# Patient Record
Sex: Male | Born: 1940 | Race: Black or African American | Hispanic: No | Marital: Married | Smoking: Never smoker
Health system: Southern US, Community
[De-identification: ages and names within clinical notes are randomized; demographics above are authoritative.]

## PROBLEM LIST (undated history)

## (undated) DIAGNOSIS — F419 Anxiety disorder, unspecified: Secondary | ICD-10-CM

## (undated) DIAGNOSIS — Z87898 Personal history of other specified conditions: Secondary | ICD-10-CM

## (undated) DIAGNOSIS — Z8669 Personal history of other diseases of the nervous system and sense organs: Secondary | ICD-10-CM

## (undated) DIAGNOSIS — G8929 Other chronic pain: Secondary | ICD-10-CM

## (undated) HISTORY — DX: Other chronic pain: G89.29

## (undated) HISTORY — DX: Personal history of other specified conditions: Z87.898

## (undated) HISTORY — DX: Personal history of other diseases of the nervous system and sense organs: Z86.69

## (undated) HISTORY — DX: Anxiety disorder, unspecified: F41.9

---

## 2014-11-02 ENCOUNTER — Encounter: Payer: Self-pay | Admitting: Family Medicine

## 2014-11-02 ENCOUNTER — Ambulatory Visit (INDEPENDENT_AMBULATORY_CARE_PROVIDER_SITE_OTHER): Payer: Medicare Other | Admitting: Family Medicine

## 2014-11-02 VITALS — BP 126/78 | HR 72 | Ht 65.5 in | Wt 182.4 lb

## 2014-11-02 DIAGNOSIS — G8929 Other chronic pain: Secondary | ICD-10-CM

## 2014-11-02 DIAGNOSIS — Z7189 Other specified counseling: Secondary | ICD-10-CM

## 2014-11-02 DIAGNOSIS — Z7689 Persons encountering health services in other specified circumstances: Secondary | ICD-10-CM

## 2014-11-02 DIAGNOSIS — F1110 Opioid abuse, uncomplicated: Secondary | ICD-10-CM | POA: Diagnosis not present

## 2014-11-02 NOTE — Progress Notes (Signed)
Subjective:    Patient ID: Michael Wells, male    DOB: 04-03-1940, 74 y.o.   MRN: [id number]  HPI He is new to the practice and here to establish primary care. He has been going to [facility] urgent care for his chronic pain and states they will no longer see him due to his recent change in insurance from [employer] to [employer].  Reports seing Dr. Marcello Fennel, urology and states he sees him for prostate issues, sees him annually and has appointment later this month.   States he takes hydrocodone for arthritis mainly on his left side and back, and "twisted pelvis" that was diagnosed years ago in the [employer]. Reports chronic pain for a few years and has been taking Hydrocodone for this. He states he takes up to 10 per day sometimes.  He is requesting a refill of his Hydrocodone and has empty bottle that was prescribed on 10/19/14 with #150.   States he takes Xanax for depression, anxiety and sleep. Denies any other health problems in the past. Denies surgeries or recent hospitalizations. Denies fever, chills, chest pain, shortness of breath, headache, dizziness, GI or GU symptoms.  Colonoscopy - Dr. Lupe Carney office last month, September, and normal per patient.  Eye doctor 2 years ago.  Blood work done at Urgent Care in past 6 months. Lives with wife, retired from [employer] for 30 years.  3 kids Never smoker, drinks socially 1-2 times per month, and denies drug use.   immunzations- not known States he has never had shingles or pneumonia vaccines and does not know what these are.   Reviewed allergies, medications, past medical and social history.   Review of Systems Pertinent positives and negatives in the history of present illness.    Objective:   Physical Exam  Alert and oriented and in no acute distress. Not otherwise examined      Assessment & Plan:  Chronic pain  Narcotic abuse  Spoke with Wray Kearns, NP at [facility] and she states she has been  prescribing hydrocodone for patient and she gave him preprinted prescriptions dated 11/19/2014 and 12/19/2014 and that he should have enough pain medication to get him through until January. Discussed with her that he was here requesting refills on his pain medication and that he stated he had been taking up to 10 per day some days. She stated she has never had a problem with him requesting his pain medication early and that she had performed drug testing on him and he always tested positive for the medication in his system. She states they do not take his new health insurance. She states they will fax over his records.  Discussed at length, with patient, that I would not be able to refill his narcotic pain medication and that he should not be taking them more than prescribed. Discussed risks of taking hydrocodone more than prescribed including respiratory depression and death. Recommend that he go to the pain clinic for further evaluation and management of chronic pain. Referral was made.  Also discussed that Xanax is not an appropriate medication to use for sleep and spent several minutes discussing good sleep hygiene.  He appears to have limited knowledge regarding primary care, immunizations or health maintenance. He states that he doesn't think he would need to come back here because his only problem is needing his pain medication. Discussed that he may have other medical issues in the future that he would need to have addressed. Spent more  than 45 minutes with patient and more than 50 percent in counseling.

## 2014-11-02 NOTE — Patient Instructions (Signed)
We will refer you to Pain management. You have enough pain medication to get you through til January according to Wray KearnsKimberly Milsap, NP at [facility].

## 2014-12-16 ENCOUNTER — Ambulatory Visit
Admission: RE | Admit: 2014-12-16 | Discharge: 2014-12-16 | Disposition: A | Discharge status: Home / Self Care | Payer: BLUE CROSS/BLUE SHIELD | Source: Ambulatory Visit | Attending: Anesthesiology | Admitting: Anesthesiology

## 2014-12-16 ENCOUNTER — Other Ambulatory Visit: Payer: Self-pay | Admitting: Anesthesiology

## 2014-12-16 DIAGNOSIS — M545 Low back pain, unspecified: Secondary | ICD-10-CM

## 2017-07-01 ENCOUNTER — Telehealth: Payer: Self-pay

## 2017-07-01 NOTE — Telephone Encounter (Signed)
Left message on voicemail for patient to call to schedule AWV.

## 2017-07-14 ENCOUNTER — Telehealth: Payer: Self-pay | Admitting: Family Medicine

## 2017-07-14 NOTE — Telephone Encounter (Signed)
Pt returned the call and declined to make an appt. Pt states he is no longer a pt here.

## 2020-03-09 ENCOUNTER — Emergency Department (HOSPITAL_COMMUNITY): Payer: Medicare (Managed Care)

## 2020-03-09 ENCOUNTER — Encounter (HOSPITAL_COMMUNITY): Payer: Self-pay | Admitting: Emergency Medicine

## 2020-03-09 ENCOUNTER — Inpatient Hospital Stay (HOSPITAL_COMMUNITY)
Admission: EM | Admit: 2020-03-09 | Discharge: 2020-03-11 | DRG: 035 | Disposition: A | Discharge status: Home / Self Care | Payer: Medicare (Managed Care) | Attending: Internal Medicine | Admitting: Internal Medicine

## 2020-03-09 ENCOUNTER — Other Ambulatory Visit: Payer: Self-pay

## 2020-03-09 DIAGNOSIS — I63411 Cerebral infarction due to embolism of right middle cerebral artery: Secondary | ICD-10-CM | POA: Diagnosis not present

## 2020-03-09 DIAGNOSIS — Z791 Long term (current) use of non-steroidal anti-inflammatories (NSAID): Secondary | ICD-10-CM

## 2020-03-09 DIAGNOSIS — I6340 Cerebral infarction due to embolism of unspecified cerebral artery: Secondary | ICD-10-CM | POA: Diagnosis not present

## 2020-03-09 DIAGNOSIS — H3411 Central retinal artery occlusion, right eye: Secondary | ICD-10-CM | POA: Diagnosis present

## 2020-03-09 DIAGNOSIS — G8929 Other chronic pain: Secondary | ICD-10-CM

## 2020-03-09 DIAGNOSIS — F419 Anxiety disorder, unspecified: Secondary | ICD-10-CM | POA: Diagnosis present

## 2020-03-09 DIAGNOSIS — G8921 Chronic pain due to trauma: Secondary | ICD-10-CM

## 2020-03-09 DIAGNOSIS — H5461 Unqualified visual loss, right eye, normal vision left eye: Secondary | ICD-10-CM | POA: Diagnosis present

## 2020-03-09 DIAGNOSIS — Z20822 Contact with and (suspected) exposure to covid-19: Secondary | ICD-10-CM | POA: Diagnosis present

## 2020-03-09 DIAGNOSIS — Z79899 Other long term (current) drug therapy: Secondary | ICD-10-CM

## 2020-03-09 DIAGNOSIS — I6523 Occlusion and stenosis of bilateral carotid arteries: Secondary | ICD-10-CM | POA: Diagnosis present

## 2020-03-09 DIAGNOSIS — Z810 Family history of intellectual disabilities: Secondary | ICD-10-CM

## 2020-03-09 DIAGNOSIS — I639 Cerebral infarction, unspecified: Secondary | ICD-10-CM | POA: Diagnosis not present

## 2020-03-09 DIAGNOSIS — H539 Unspecified visual disturbance: Secondary | ICD-10-CM

## 2020-03-09 DIAGNOSIS — F17210 Nicotine dependence, cigarettes, uncomplicated: Secondary | ICD-10-CM | POA: Diagnosis present

## 2020-03-09 DIAGNOSIS — Z7982 Long term (current) use of aspirin: Secondary | ICD-10-CM

## 2020-03-09 DIAGNOSIS — Z8261 Family history of arthritis: Secondary | ICD-10-CM

## 2020-03-09 DIAGNOSIS — R29701 NIHSS score 1: Secondary | ICD-10-CM | POA: Diagnosis present

## 2020-03-09 DIAGNOSIS — G479 Sleep disorder, unspecified: Secondary | ICD-10-CM | POA: Diagnosis present

## 2020-03-09 DIAGNOSIS — I672 Cerebral atherosclerosis: Secondary | ICD-10-CM | POA: Diagnosis present

## 2020-03-09 LAB — BASIC METABOLIC PANEL
Anion gap: 11 (ref 5–15)
BUN: 11 mg/dL (ref 8–23)
CO2: 20 mmol/L — ABNORMAL LOW (ref 22–32)
Calcium: 10.7 mg/dL — ABNORMAL HIGH (ref 8.9–10.3)
Chloride: 107 mmol/L (ref 98–111)
Creatinine, Ser: 1.03 mg/dL (ref 0.61–1.24)
GFR, Estimated: >60 mL/min (ref >60)
Glucose, Bld: 95 mg/dL (ref 70–99)
Potassium: 3.9 mmol/L (ref 3.5–5.1)
Sodium: 138 mmol/L (ref 135–145)

## 2020-03-09 LAB — SEDIMENTATION RATE: Sed Rate: 15 mm/hr (ref 0–16)

## 2020-03-09 LAB — CBC
HCT: 43.9 % (ref 39.0–52.0)
Hemoglobin: 15 g/dL (ref 13.0–17.0)
MCH: 33.6 pg (ref 26.0–34.0)
MCHC: 34.2 g/dL (ref 30.0–36.0)
MCV: 98.4 fL (ref 80.0–100.0)
Platelets: 171 10*3/uL (ref 150–400)
RBC: 4.46 MIL/uL (ref 4.22–5.81)
RDW: 13.5 % (ref 11.5–15.5)
WBC: 7.1 10*3/uL (ref 4.0–10.5)
nRBC: 0 % (ref 0.0–0.2)

## 2020-03-09 LAB — C-REACTIVE PROTEIN: CRP: 0.5 mg/dL (ref <1.0)

## 2020-03-09 MED ORDER — ASPIRIN EC 81 MG PO TBEC: 81.0000 mg | DELAYED_RELEASE_TABLET | Freq: Every day | ORAL | Status: DC

## 2020-03-09 MED ORDER — ENOXAPARIN SODIUM 40 MG/0.4ML ~~LOC~~ SOLN
40.0000 mg | SUBCUTANEOUS | Status: DC
  Administered 2020-03-09 – 2020-03-10 (×2): 40 mg via SUBCUTANEOUS
  Filled 2020-03-09 (×2): qty 0.4

## 2020-03-09 MED ORDER — GADOBUTROL 1 MMOL/ML IV SOLN
8.0000 mL | Freq: Once | INTRAVENOUS | Status: AC | PRN
  Administered 2020-03-09: 8 mL via INTRAVENOUS

## 2020-03-09 MED ORDER — ATORVASTATIN CALCIUM 40 MG PO TABS
40.0000 mg | ORAL_TABLET | Freq: Every day | ORAL | Status: DC
  Administered 2020-03-10 – 2020-03-11 (×2): 40 mg via ORAL
  Filled 2020-03-09 (×2): qty 1

## 2020-03-09 MED ORDER — ASPIRIN 81 MG PO CHEW
324.0000 mg | CHEWABLE_TABLET | Freq: Once | ORAL | Status: AC
  Administered 2020-03-09: 324 mg via ORAL
  Filled 2020-03-09: qty 4

## 2020-03-09 MED ORDER — STROKE: EARLY STAGES OF RECOVERY BOOK
Freq: Once | Status: AC
  Administered 2020-03-09: 1
  Filled 2020-03-09: qty 1

## 2020-03-09 MED ORDER — ACETAMINOPHEN 160 MG/5ML PO SOLN: 650.0000 mg | ORAL | Status: DC | PRN

## 2020-03-09 MED ORDER — ACETAMINOPHEN 650 MG RE SUPP: 650.0000 mg | RECTAL | Status: DC | PRN

## 2020-03-09 MED ORDER — IOHEXOL 350 MG/ML SOLN
75.0000 mL | Freq: Once | INTRAVENOUS | Status: AC | PRN
  Administered 2020-03-09: 75 mL via INTRAVENOUS

## 2020-03-09 MED ORDER — HYDROCODONE-ACETAMINOPHEN 5-325 MG PO TABS
1.0000 | ORAL_TABLET | Freq: Four times a day (QID) | ORAL | Status: DC | PRN
  Administered 2020-03-11: 1 via ORAL
  Filled 2020-03-09: qty 1

## 2020-03-09 MED ORDER — ACETAMINOPHEN 325 MG PO TABS: 650.0000 mg | ORAL_TABLET | ORAL | Status: DC | PRN

## 2020-03-09 NOTE — ED Notes (Signed)
Back to room at this time.
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
## 2020-03-09 NOTE — ED Triage Notes (Signed)
Pt sent by his eye doctor for further evaluation of potential right optic nerve edema. Pt reports his vision is cloudy in eye.

## 2020-03-09 NOTE — ED Notes (Signed)
Rounded in room and have not seen this pt in room.

## 2020-03-09 NOTE — ED Notes (Signed)
Called MRI and staff states that pt is there, waiting on IV team so pt can have scans done.

## 2020-03-09 NOTE — ED Provider Notes (Signed)
I spoke with his ophthalmologist, Dr. Talbert Forest.   Patient has had decreased vision in right eye, noted some papilledema.  Recommending ESR, CRP to rule out giant cell arteritis, recommending MRI brain and orbits to rule out mass, stroke.  If the work-up is negative, likely patient can be discharged home and he will follow up patient in the outpatient setting.   Lucrezia Starch, MD 03/09/20 1556

## 2020-03-09 NOTE — ED Notes (Signed)
Patient transported to CT 

## 2020-03-09 NOTE — ED Provider Notes (Signed)
[facility] EMERGENCY DEPARTMENT Provider Note   CSN: [id number] Arrival date & time: 03/09/20  1523     History Chief Complaint  Patient presents with  . Eye Problem    Michael Wells is a 80 y.o. male.  The history is provided by the patient, the spouse and medical records. No language interpreter was used.     80 year old male significant history of anxiety, chronic pain, sent here at the recommendations of ophthalmologist Dr. Vonna Kotyk for evaluations of vision changes.  Patient report for the past 2 weeks he has noticed change in vision in his right eye.  He described as a dusty sand-like sensation to his eye or with blurry vision sometimes he has to close the eye in order to see from his left eye.  He has been waxing and waning but does affect his vision.  No associated fever chills no headache no double vision no focal numbness or focal weakness or neck pain.  He was seen by his eye specialist today but was recommended to come to ER for MRI of his brain when he was found to have potential right optic nerve edema on the exam.  Patient without history of prior stroke.  He has been fully vaccinated for COVID-19.  He denies having any confusion.  Past Medical History:  Diagnosis Date  . Anxiety   . Chronic pain   . H/O sleep disturbance     There are no problems to display for this patient.   History reviewed. No pertinent surgical history.     Family History  Problem Relation Age of Onset  . Cancer Mother        cervical  . Cancer Father        prostate  . Arthritis Sister   . Drug abuse Brother   . Mental retardation Brother   . Aneurysm Brother     Social History   Tobacco Use  . Smoking status: Never Smoker  Substance Use Topics  . Alcohol use: Yes    Alcohol/week: 0.0 standard drinks    Comment: socially 1-2 drinks month  . Drug use: No    Home Medications Prior to Admission medications   Medication Sig Start Date End Date Taking?  Authorizing Provider  ALPRAZolam Prudy Feeler) 0.25 MG tablet Take 0.25 mg by mouth at bedtime as needed for anxiety or sleep.    [provider]  HYDROcodone-acetaminophen (NORCO/VICODIN) 5-325 MG tablet Take 1 tablet by mouth every 6 (six) hours as needed for moderate pain.    [provider]  meloxicam (MOBIC) 15 MG tablet Take 15 mg by mouth daily.    [provider]    Allergies    Patient has no known allergies.  Review of Systems   Review of Systems  All other systems reviewed and are negative.   Physical Exam Updated Vital Signs BP (!) 149/101 (BP Location: Left Arm)   Pulse 81   Temp (!) 97.5 F (36.4 C) (Oral)   Resp 18   Ht 5\' 7"  (1.702 m)   Wt 77.1 kg   SpO2 96%   BMI 26.63 kg/m   Physical Exam Vitals and nursing note reviewed.  Constitutional:      General: He is not in acute distress.    Appearance: He is well-developed and well-nourished.  HENT:     Head: Normocephalic and atraumatic.  Eyes:     General: Lids are normal. Lids are everted, no foreign bodies appreciated.  Right eye: No discharge.        Left eye: No discharge.     Conjunctiva/sclera: Conjunctivae normal.     Right eye: Right conjunctiva is not injected. No chemosis, exudate or hemorrhage.    Left eye: Left conjunctiva is not injected. No chemosis, exudate or hemorrhage.    Comments: Right eye with diminished visual acuity compared to left.  Musculoskeletal:     Cervical back: Neck supple.  Skin:    Findings: No rash.  Neurological:     Mental Status: He is alert and oriented to person, place, and time.     GCS: GCS eye subscore is 4. GCS verbal subscore is 5. GCS motor subscore is 6.     Cranial Nerves: Cranial nerves are intact.     Sensory: Sensation is intact.     Motor: Motor function is intact.  Psychiatric:        Mood and Affect: Mood and affect normal.     ED Results / Procedures / Treatments   Labs (all labs ordered are listed, but only  abnormal results are displayed) Labs Reviewed  BASIC METABOLIC PANEL - Abnormal; Notable for the following components:      Result Value   CO2 20 (*)    Calcium 10.7 (*)    All other components within normal limits  SARS CORONAVIRUS 2 (TAT 6-24 HRS)  CBC  C-REACTIVE PROTEIN  SEDIMENTATION RATE  HEMOGLOBIN A1C  LIPID PANEL    EKG None  Radiology MR ORBITS W WO CONTRAST  Result Date: 03/09/2020 CLINICAL DATA:  Monocular right sided vision loss.  Papilledema. EXAM: MRI HEAD AND ORBITS WITHOUT AND WITH CONTRAST TECHNIQUE: Multiplanar, multiecho pulse sequences of the brain and surrounding structures were obtained without and with intravenous contrast. Multiplanar, multiecho pulse sequences of the orbits and surrounding structures were obtained including fat saturation techniques, before and after intravenous contrast administration. CONTRAST:  43mL GADAVIST GADOBUTROL 1 MMOL/ML IV SOLN COMPARISON:  None. FINDINGS: MRI HEAD FINDINGS Brain: The study suffers from motion degradation despite the technologist's best efforts. Diffusion imaging shows punctate acute infarction in the right parieto-occipital subcortical white matter. Few other punctate foci of acute infarction at the right frontoparietal junction vertex. No large confluent infarction. Findings are consistent with micro embolic insults. Elsewhere, there chronic small-vessel ischemic changes of the pons. Few old small vessel cerebellar infarctions. Cerebral hemispheres show pronounced chronic small-vessel ischemic changes throughout the deep and subcortical white matter. No mass, hemorrhage, hydrocephalus or extra-axial collection. After contrast administration, no abnormal brain enhancement occurs. Vascular: Major vessels at the base of the brain show flow. Skull and upper cervical spine: Negative Other: None MRI ORBITS FINDINGS This study also suffers from motion degradation. Orbits: Both globes appear normal. Both optic nerves appear normal.  Orbital fat is normal. Extra-ocular muscles are normal. Visualized sinuses: Clear except for minimal left anterior ethmoid fluid. Soft tissues: Other regional soft tissues appear normal. IMPRESSION: 1. Motion degraded exam. 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 Few other punctate foci of acute infarction at the right frontoparietal junction vertex. Findings consistent with micro embolic insults from the right carotid, heart or ascending aorta. No swelling or hemorrhage. Certainly there could be micro embolic disease to the right orbit not visible by MRI. 2. 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 3. The orbital study itself is normal. Electronically Signed   By: Paulina Fusi M.D.   On: 03/09/2020 20:58  Procedures .Critical Care Performed by: Fayrene Helper, PA-C Authorized by: Fayrene Helper, PA-C   Critical care provider statement:    Critical care time (minutes):  37   Critical care was time spent personally by me on the following activities:  Discussions with consultants, evaluation of patient's response to treatment, examination of patient, ordering and performing treatments and interventions, ordering and review of laboratory studies, ordering and review of radiographic studies, pulse oximetry, re-evaluation of patient's condition, obtaining history from patient or surrogate and review of old charts     Medications Ordered in ED Medications  aspirin chewable tablet 324 mg (has no administration in time range)  gadobutrol (GADAVIST) 1 MMOL/ML injection 8 mL (8 mLs Intravenous Contrast Given 03/09/20 2003)    ED Course  I have reviewed the triage vital signs and the nursing notes.  Pertinent labs & imaging results that were available during my care of the patient were reviewed by me and considered in my medical decision making (see chart for details).    MDM Rules/Calculators/A&P                           BP (!) 157/95   Pulse 62   Temp (!) 97.5 F (36.4 C) (Oral)   Resp 14   Ht 5\' 7"  (1.702 m)   Wt 77.1 kg   SpO2 97%   BMI 26.63 kg/m   Final Clinical Impression(s) / ED Diagnoses Final diagnoses:  Acute embolic stroke (HCC)  Visual changes    Rx / DC Orders ED Discharge Orders    None     9:14 PM Patient noticed visual changes to involving his right eye that is waxing waning ongoing for the past 2 weeks.  Was seen by his ophthalmologist today and was sent here for further work-up when he was found to have signs of an optic nerve edema on initial exam by ophthalmologist.  MRI of the brain as well as the orbit was obtained which shows punctate acute infarction in the right parieto-occipital subcortical white matter as well as few other punctate foci of subacute infarction in the right frontal parietal junction vertex.  Findings discussed with microembolic insults from the right carotid heart or ascending aorta.  No swelling or hemorrhage.  Extensive chronic small vessel ischemic changes elsewhere throughout the brain.  I did discuss this finding with patient as well has with on-call neurologist, Dr. who recommend initiate aspirin, obtain head and neck CT angiogram and consult medicine for admission.  At this time patient is hemodynamically stable.  9:42 PM Appreciate consultation from [facility] Dr. Iver Nestle who agrees to see and will admit pt for further care.    Cyndia Bent, PA-C 03/09/20 2142    [place]orton, 2143, DO 03/10/20 0011

## 2020-03-09 NOTE — ED Notes (Signed)
Pt still not in room.

## 2020-03-10 ENCOUNTER — Observation Stay (HOSPITAL_COMMUNITY): Payer: Medicare (Managed Care)

## 2020-03-10 ENCOUNTER — Encounter (HOSPITAL_COMMUNITY)
Admission: EM | Disposition: A | Discharge status: Home / Self Care | Payer: Self-pay | Source: Home / Self Care | Attending: Internal Medicine

## 2020-03-10 ENCOUNTER — Observation Stay (HOSPITAL_COMMUNITY): Payer: Medicare (Managed Care) | Admitting: Certified Registered Nurse Anesthetist

## 2020-03-10 ENCOUNTER — Encounter (HOSPITAL_COMMUNITY): Payer: Self-pay | Admitting: Family Medicine

## 2020-03-10 ENCOUNTER — Observation Stay (HOSPITAL_BASED_OUTPATIENT_CLINIC_OR_DEPARTMENT_OTHER): Payer: Medicare (Managed Care)

## 2020-03-10 DIAGNOSIS — Z7982 Long term (current) use of aspirin: Secondary | ICD-10-CM | POA: Diagnosis not present

## 2020-03-10 DIAGNOSIS — E7800 Pure hypercholesterolemia, unspecified: Secondary | ICD-10-CM

## 2020-03-10 DIAGNOSIS — F419 Anxiety disorder, unspecified: Secondary | ICD-10-CM | POA: Diagnosis not present

## 2020-03-10 DIAGNOSIS — F17210 Nicotine dependence, cigarettes, uncomplicated: Secondary | ICD-10-CM | POA: Diagnosis not present

## 2020-03-10 DIAGNOSIS — H3411 Central retinal artery occlusion, right eye: Secondary | ICD-10-CM

## 2020-03-10 DIAGNOSIS — H539 Unspecified visual disturbance: Secondary | ICD-10-CM | POA: Diagnosis not present

## 2020-03-10 DIAGNOSIS — G8921 Chronic pain due to trauma: Secondary | ICD-10-CM | POA: Diagnosis present

## 2020-03-10 DIAGNOSIS — I63411 Cerebral infarction due to embolism of right middle cerebral artery: Secondary | ICD-10-CM | POA: Diagnosis present

## 2020-03-10 DIAGNOSIS — G8929 Other chronic pain: Secondary | ICD-10-CM | POA: Diagnosis not present

## 2020-03-10 DIAGNOSIS — R29701 NIHSS score 1: Secondary | ICD-10-CM | POA: Diagnosis not present

## 2020-03-10 DIAGNOSIS — Z95828 Presence of other vascular implants and grafts: Secondary | ICD-10-CM | POA: Diagnosis not present

## 2020-03-10 DIAGNOSIS — Z810 Family history of intellectual disabilities: Secondary | ICD-10-CM | POA: Diagnosis not present

## 2020-03-10 DIAGNOSIS — I6523 Occlusion and stenosis of bilateral carotid arteries: Secondary | ICD-10-CM

## 2020-03-10 DIAGNOSIS — I672 Cerebral atherosclerosis: Secondary | ICD-10-CM | POA: Diagnosis present

## 2020-03-10 DIAGNOSIS — H5461 Unqualified visual loss, right eye, normal vision left eye: Secondary | ICD-10-CM | POA: Diagnosis present

## 2020-03-10 DIAGNOSIS — Z20822 Contact with and (suspected) exposure to covid-19: Secondary | ICD-10-CM | POA: Diagnosis not present

## 2020-03-10 DIAGNOSIS — I6521 Occlusion and stenosis of right carotid artery: Secondary | ICD-10-CM | POA: Diagnosis not present

## 2020-03-10 DIAGNOSIS — G479 Sleep disorder, unspecified: Secondary | ICD-10-CM | POA: Diagnosis present

## 2020-03-10 DIAGNOSIS — Z8261 Family history of arthritis: Secondary | ICD-10-CM | POA: Diagnosis not present

## 2020-03-10 DIAGNOSIS — I639 Cerebral infarction, unspecified: Secondary | ICD-10-CM | POA: Diagnosis present

## 2020-03-10 DIAGNOSIS — I6389 Other cerebral infarction: Secondary | ICD-10-CM

## 2020-03-10 DIAGNOSIS — Z791 Long term (current) use of non-steroidal anti-inflammatories (NSAID): Secondary | ICD-10-CM | POA: Diagnosis not present

## 2020-03-10 DIAGNOSIS — Z79899 Other long term (current) drug therapy: Secondary | ICD-10-CM | POA: Diagnosis not present

## 2020-03-10 HISTORY — PX: IR STENT PLACEMENT ANTE CAROTID INC ANGIO: IMG5522

## 2020-03-10 HISTORY — PX: RADIOLOGY WITH ANESTHESIA: SHX6223

## 2020-03-10 HISTORY — PX: IR INTRAVSC STENT CERV CAROTID W/EMB-PROT MOD SED INCL ANGIO: IMG2303

## 2020-03-10 HISTORY — PX: IR ANGIO VERTEBRAL SEL SUBCLAVIAN INNOMINATE UNI L MOD SED: IMG5364

## 2020-03-10 HISTORY — PX: IR US GUIDE VASC ACCESS RIGHT: IMG2390

## 2020-03-10 LAB — HEMOGLOBIN A1C
Hgb A1c MFr Bld: 5.6 % (ref 4.8–5.6)
Mean Plasma Glucose: 114.02 mg/dL

## 2020-03-10 LAB — CBC WITH DIFFERENTIAL/PLATELET
Abs Immature Granulocytes: 0.01 10*3/uL (ref 0.00–0.07)
Basophils Absolute: 0 10*3/uL (ref 0.0–0.1)
Basophils Relative: 0 %
Eosinophils Absolute: 0.1 10*3/uL (ref 0.0–0.5)
Eosinophils Relative: 1 %
HCT: 37.3 % — ABNORMAL LOW (ref 39.0–52.0)
Hemoglobin: 12.7 g/dL — ABNORMAL LOW (ref 13.0–17.0)
Immature Granulocytes: 0 %
Lymphocytes Relative: 15 %
Lymphs Abs: 1.3 10*3/uL (ref 0.7–4.0)
MCH: 33.1 pg (ref 26.0–34.0)
MCHC: 34 g/dL (ref 30.0–36.0)
MCV: 97.1 fL (ref 80.0–100.0)
Monocytes Absolute: 0.6 10*3/uL (ref 0.1–1.0)
Monocytes Relative: 7 %
Neutro Abs: 6.4 10*3/uL (ref 1.7–7.7)
Neutrophils Relative %: 77 %
Platelets: 157 10*3/uL (ref 150–400)
RBC: 3.84 MIL/uL — ABNORMAL LOW (ref 4.22–5.81)
RDW: 13.3 % (ref 11.5–15.5)
WBC: 8.4 10*3/uL (ref 4.0–10.5)
nRBC: 0 % (ref 0.0–0.2)

## 2020-03-10 LAB — ECHOCARDIOGRAM COMPLETE
AR max vel: 2.34 cm2
AV Area VTI: 2.38 cm2
AV Area mean vel: 2.26 cm2
AV Mean grad: 4 mmHg
AV Peak grad: 6.9 mmHg
Ao pk vel: 1.31 m/s
Height: 67 in
S' Lateral: 3 cm
Weight: 2720 [oz_av]

## 2020-03-10 LAB — PROTIME-INR
INR: 1 (ref 0.8–1.2)
Prothrombin Time: 13.2 seconds (ref 11.4–15.2)

## 2020-03-10 LAB — LIPID PANEL
Cholesterol: 145 mg/dL (ref 0–200)
HDL: 39 mg/dL — ABNORMAL LOW (ref >40)
LDL Cholesterol: 95 mg/dL (ref 0–99)
Total CHOL/HDL Ratio: 3.7 ratio
Triglycerides: 53 mg/dL (ref <150)
VLDL: 11 mg/dL (ref 0–40)

## 2020-03-10 LAB — GLUCOSE, CAPILLARY
Glucose-Capillary: 66 mg/dL — ABNORMAL LOW (ref 70–99)
Glucose-Capillary: 86 mg/dL (ref 70–99)
Glucose-Capillary: 87 mg/dL (ref 70–99)

## 2020-03-10 LAB — POCT ACTIVATED CLOTTING TIME
Activated Clotting Time: 255 seconds
Activated Clotting Time: 333 seconds

## 2020-03-10 LAB — TYPE AND SCREEN
ABO/RH(D): O POS
Antibody Screen: NEGATIVE

## 2020-03-10 LAB — ABO/RH: ABO/RH(D): O POS

## 2020-03-10 LAB — SARS CORONAVIRUS 2 (TAT 6-24 HRS): SARS Coronavirus 2: NEGATIVE

## 2020-03-10 SURGERY — IR WITH ANESTHESIA
Anesthesia: Monitor Anesthesia Care

## 2020-03-10 MED ORDER — CLEVIDIPINE BUTYRATE 0.5 MG/ML IV EMUL
INTRAVENOUS | Status: AC
  Filled 2020-03-10: qty 50

## 2020-03-10 MED ORDER — LACTATED RINGERS IV SOLN: INTRAVENOUS | Status: DC | PRN

## 2020-03-10 MED ORDER — ASPIRIN EC 81 MG PO TBEC
81.0000 mg | DELAYED_RELEASE_TABLET | Freq: Every day | ORAL | Status: DC
  Administered 2020-03-10 – 2020-03-11 (×2): 81 mg via ORAL
  Filled 2020-03-10 (×2): qty 1

## 2020-03-10 MED ORDER — FENTANYL CITRATE (PF) 100 MCG/2ML IJ SOLN
INTRAMUSCULAR | Status: AC
  Filled 2020-03-10: qty 2

## 2020-03-10 MED ORDER — MORPHINE SULFATE (PF) 2 MG/ML IV SOLN: 2.0000 mg | Freq: Once | INTRAVENOUS | Status: DC

## 2020-03-10 MED ORDER — PROPOFOL 500 MG/50ML IV EMUL
INTRAVENOUS | Status: DC | PRN
  Administered 2020-03-10: 40 ug/kg/min via INTRAVENOUS

## 2020-03-10 MED ORDER — IOHEXOL 300 MG/ML  SOLN
150.0000 mL | Freq: Once | INTRAMUSCULAR | Status: AC | PRN
  Administered 2020-03-10: 60 mL via INTRA_ARTERIAL

## 2020-03-10 MED ORDER — ASPIRIN EC 325 MG PO TBEC
325.0000 mg | DELAYED_RELEASE_TABLET | Freq: Every day | ORAL | Status: DC
Start: 2020-03-10 — End: 2020-03-10

## 2020-03-10 MED ORDER — DEXMEDETOMIDINE HCL IN NACL 400 MCG/100ML IV SOLN: 0.4000 ug/kg/h | INTRAVENOUS | Status: DC

## 2020-03-10 MED ORDER — ONDANSETRON HCL 4 MG/2ML IJ SOLN
4.0000 mg | Freq: Four times a day (QID) | INTRAMUSCULAR | Status: DC | PRN
  Administered 2020-03-11: 4 mg via INTRAVENOUS
  Filled 2020-03-10 (×2): qty 2

## 2020-03-10 MED ORDER — FENTANYL CITRATE (PF) 100 MCG/2ML IJ SOLN
INTRAMUSCULAR | Status: DC | PRN
  Administered 2020-03-10: 25 ug via INTRAVENOUS

## 2020-03-10 MED ORDER — CLOPIDOGREL BISULFATE 75 MG PO TABS: 75.0000 mg | ORAL_TABLET | Freq: Every day | ORAL | Status: DC

## 2020-03-10 MED ORDER — TICAGRELOR 90 MG PO TABS
180.0000 mg | ORAL_TABLET | Freq: Once | ORAL | Status: AC
  Administered 2020-03-10: 180 mg via ORAL
  Filled 2020-03-10: qty 2

## 2020-03-10 MED ORDER — FENTANYL CITRATE (PF) 100 MCG/2ML IJ SOLN
25.0000 ug | INTRAMUSCULAR | Status: DC | PRN
  Administered 2020-03-10: 25 ug via INTRAVENOUS

## 2020-03-10 MED ORDER — CHLORHEXIDINE GLUCONATE CLOTH 2 % EX PADS: 6.0000 | MEDICATED_PAD | Freq: Every day | CUTANEOUS | Status: DC

## 2020-03-10 MED ORDER — CLEVIDIPINE BUTYRATE 0.5 MG/ML IV EMUL: 0.0000 mg/h | INTRAVENOUS | Status: DC

## 2020-03-10 MED ORDER — TICAGRELOR 90 MG PO TABS
90.0000 mg | ORAL_TABLET | Freq: Two times a day (BID) | ORAL | Status: DC
  Administered 2020-03-10 – 2020-03-11 (×2): 90 mg via ORAL
  Filled 2020-03-10 (×3): qty 1

## 2020-03-10 MED ORDER — CLOPIDOGREL BISULFATE 300 MG PO TABS: 300.0000 mg | ORAL_TABLET | Freq: Once | ORAL | Status: DC

## 2020-03-10 MED ORDER — LIDOCAINE HCL 1 % IJ SOLN
INTRAMUSCULAR | Status: AC | PRN
  Administered 2020-03-10: 10 mL

## 2020-03-10 MED ORDER — HEPARIN SODIUM (PORCINE) 1000 UNIT/ML IJ SOLN
INTRAMUSCULAR | Status: DC | PRN
  Administered 2020-03-10: 5000 [IU] via INTRAVENOUS
  Administered 2020-03-10: 1000 [IU] via INTRAVENOUS

## 2020-03-10 MED ORDER — SODIUM CHLORIDE 0.9% IV SOLUTION: Freq: Once | INTRAVENOUS | Status: DC

## 2020-03-10 MED ORDER — DEXTROSE-NACL 5-0.9 % IV SOLN: INTRAVENOUS | Status: DC

## 2020-03-10 MED ORDER — PROPOFOL 10 MG/ML IV BOLUS
INTRAVENOUS | Status: DC | PRN
  Administered 2020-03-10: 10 mg via INTRAVENOUS

## 2020-03-10 MED ORDER — LIDOCAINE HCL 1 % IJ SOLN
INTRAMUSCULAR | Status: AC
  Filled 2020-03-10: qty 20

## 2020-03-10 NOTE — Progress Notes (Addendum)
Since admission from [facility] at 2100,  R femoral site has had grade 1 hematoma with neurovascular assessment intact. However, hematoma has been increasingly getting worse d/t pt's impulsiveness with wanting to get out of bed. This has occurred twice and the hematoma had worsened each time. Manual pressure has been performed and hematoma outlined. Neurovascular checks intact throughout episodes. Dr. Rachael Darby, [facility] was notified. Per  MD to continue to monitor. During pt's second attempt at 2330 to get out of bed, BP decreased to 80/50s. Pt given LR bolus and BP responsive. Dr. Iver Nestle, neurologist, called and per MD to draw cbc STAT. Dr.Bhagat at bedside to assess pt at 2350. Per MD to restart 6 hour flat time at 0000, continue monitoring site since hgb 12.7, notify if SBP <100 or signs of active bleeding and she will notify Dr.  Melchor Amour of pt's status.

## 2020-03-10 NOTE — Anesthesia Procedure Notes (Signed)
Procedure Name: MAC Date/Time: 03/10/2020 4:23 PM Performed by: Janene Harvey, CRNA Pre-anesthesia Checklist: Patient identified, Emergency Drugs available, Suction available and Patient being monitored Patient Re-evaluated:Patient Re-evaluated prior to induction Oxygen Delivery Method: Simple face mask Induction Type: IV induction Placement Confirmation: positive ETCO2 Dental Injury: Teeth and Oropharynx as per pre-operative assessment

## 2020-03-10 NOTE — Progress Notes (Incomplete)
Since admission from [facility], pt R femoral site has had grade 1 hematoma with neurovascular assessment intact. However, hematoma has been increasingly getting worse d/t pt's impulsiveness with wanting to get out of bed.
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
## 2020-03-10 NOTE — Progress Notes (Signed)
PROGRESS NOTE    [name]  BBC:[id number] DOB: 08/25/1940 DOA: 03/09/2020 PCP: Marva Panda, NP   Brief Narrative:  Michael Wells is a 80 y.o. 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 For the past several weeks patient has been having waxing and waning right eye blurred vision with "aches".  He was evaluated by the ophthalmologist today with concerns for potential right optic nerve edema and was told to present to the ED. In ED, MRI orbit and brain was obtained showing punctate acute infarct in the right parieto-occipital white matter.  Findings consistent with microembolic insults from either the right carotid, heart or ascending aorta.  Neurology consulted and hospitalist called for admission.  Assessment & Plan:   Active Problems:   Embolic stroke (HCC)   Chronic pain  Acute embolic infarct in the right parieto-occipital with associated right sided vision loss - MRI brain obtained showing punctate acute infarct in the right parieto-occipital white matter. 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 - start daily aspirin and atorvastatin -Neurology recommending Brilinta for dual antiplatelet therapy -Echo pending -A1c 5.6  Chronic pain  Continue home hydrocodone  DVT prophylaxis: Lovenox Code Status: Full Family Communication: Wife at bedside  Status is: Inpatient  Dispo: The patient is from: Home              Anticipated d/c is to: To be determined              Anticipated d/c date is: 24 to 48 hours              Patient currently not medically stable for discharge  Consultants:   Neurology, neurovascular interventional radiology  Procedures:   Planned stent to the right ICA  Antimicrobials:  None  Subjective: No acute issues or events overnight, patient states his vision  is slightly improved but not yet back to baseline otherwise denies nausea vomiting diarrhea constipation headache fevers or chills.  Objective: Vitals:   03/10/20 0127 03/10/20 0520 03/10/20 0630 03/10/20 0736  BP: 123/85 132/87 130/78 (!) 139/94  Pulse: 72 84 76 85  Resp: 20 16 18 15   Temp:   98 F (36.7 C)   TempSrc:   Oral   SpO2: 98% 98% 97% 95%  Weight:      Height:       No intake or output data in the 24 hours ending 03/10/20 0818 Filed Weights   03/09/20 1539  Weight: 77.1 kg    Examination:  General:  Pleasantly resting in bed, No acute distress. HEENT:  Normocephalic atraumatic.  Sclerae nonicteric, noninjected.  Extraocular movements intact bilaterally.  Profoundly diminished visual fields diffusely in the right eye.  Described as "cloudy/spotty" by the patient Neck:  Without mass or deformity.  Trachea is midline. Lungs:  Clear to auscultate bilaterally without rhonchi, wheeze, or rales. Heart:  Regular rate and rhythm.  Without murmurs, rubs, or gallops. Abdomen:  Soft, nontender, nondistended.  Without guarding or rebound. Extremities: Without cyanosis, clubbing, edema, or obvious deformity. Vascular:  Dorsalis pedis and posterior tibial pulses palpable bilaterally. Skin:  Warm and dry, no erythema, no ulcerations.  Data Reviewed: I have personally reviewed following labs and imaging studies  CBC: Recent Labs  Lab 03/09/20 1600  WBC 7.1  HGB 15.0  HCT 43.9  MCV 98.4  PLT 171   Basic Metabolic Panel:  Recent Labs  Lab 03/09/20 1600  NA 138  K 3.9  CL 107  CO2 20*  GLUCOSE 95  BUN 11  CREATININE 1.03  CALCIUM 10.7*   GFR: Estimated Creatinine Clearance: 55.3 mL/min (by C-G formula based on SCr of 1.03 mg/dL). Liver Function Tests: No results for input(s): AST, ALT, ALKPHOS, BILITOT, PROT, ALBUMIN in the last 168 hours. No results for input(s): LIPASE, AMYLASE in the last 168 hours. No results for input(s): AMMONIA in the last 168  hours. Coagulation Profile: No results for input(s): INR, PROTIME in the last 168 hours. Cardiac Enzymes: No results for input(s): CKTOTAL, CKMB, CKMBINDEX, TROPONINI in the last 168 hours. BNP (last 3 results) No results for input(s): PROBNP in the last 8760 hours. HbA1C: Recent Labs    03/10/20 0245  HGBA1C 5.6   CBG: No results for input(s): GLUCAP in the last 168 hours. Lipid Profile: Recent Labs    03/10/20 0246  CHOL 145  HDL 39*  LDLCALC 95  TRIG 53  CHOLHDL 3.7   Thyroid Function Tests: No results for input(s): TSH, T4TOTAL, FREET4, T3FREE, THYROIDAB in the last 72 hours. Anemia Panel: No results for input(s): VITAMINB12, FOLATE, FERRITIN, TIBC, IRON, RETICCTPCT in the last 72 hours. Sepsis Labs: No results for input(s): PROCALCITON, LATICACIDVEN in the last 168 hours.  Recent Results (from the past 240 hour(s))  SARS CORONAVIRUS 2 (TAT 6-24 HRS) Nasopharyngeal Nasopharyngeal Swab     Status: None   Collection Time: 03/09/20  9:15 PM   Specimen: Nasopharyngeal Swab  Result Value Ref Range Status   SARS Coronavirus 2 NEGATIVE NEGATIVE Final    Comment: (NOTE) SARS-CoV-2 target nucleic acids are NOT DETECTED.  The SARS-CoV-2 RNA is generally detectable in upper and lower respiratory specimens during the acute phase of infection. Negative results do not preclude SARS-CoV-2 infection, do not rule out co-infections with other pathogens, and should not be used as the sole basis for treatment or other patient management decisions. Negative results must be combined with clinical observations, patient history, and epidemiological information. The expected result is Negative.  Fact Sheet for Patients: HairSlick.nohttps://www.fda.gov/media/138098/download  Fact Sheet for Healthcare Providers: quierodirigir.comhttps://www.fda.gov/media/138095/download  This test is not yet approved or cleared by the [place] FDA and  has been authorized for detection and/or diagnosis of SARS-CoV-2  by FDA under an Emergency Use Authorization (EUA). This EUA will remain  in effect (meaning this test can be used) for the duration of the COVID-19 declaration under Se ction 564(b)(1) of the Act, 21 U.S.C. section 360bbb-3(b)(1), unless the authorization is terminated or revoked sooner.  Performed at [facility], [address] [place], [place] [id number]      Radiology Studies: CT Angio Head W or Wo Contrast  Result Date: 03/09/2020 CLINICAL DATA:  Right optic nerve edema. Abnormal vision right eye. Small embolic infarctions shown in the right cerebral hemisphere by MRI. EXAM: CT ANGIOGRAPHY HEAD AND NECK TECHNIQUE: Multidetector CT imaging of the head and neck was performed using the standard protocol during bolus administration of intravenous contrast. Multiplanar CT image reconstructions and MIPs were obtained to evaluate the vascular anatomy. Carotid stenosis measurements (when applicable) are obtained utilizing NASCET criteria, using the distal internal carotid diameter as the denominator. CONTRAST:  75mL OMNIPAQUE IOHEXOL 350 MG/ML SOLN COMPARISON:  MRI same day FINDINGS: CT HEAD FINDINGS Brain: Age related atrophy. Chronic small-vessel ischemic changes seen affecting the pons and cerebral hemispheric white matter. No sign of acute infarction by CT. No mass lesion,  hemorrhage, hydrocephalus or extra-axial collection. Vascular: There is atherosclerotic calcification of the major vessels at the base of the brain. Skull: Negative Sinuses: Clear Orbits: Negative Review of the MIP images confirms the above findings CTA NECK FINDINGS Aortic arch: Aortic atherosclerosis. Vessel origins from the arch are not entirely included. Right carotid system: Common carotid artery widely patent to the bifurcation. Soft and calcified plaque in the ICA bulb. Severe stenosis at the distal bulb with diameter of only 1 mm. Compared to a more distal cervical ICA diameter of 4.9 mm, this indicates a 75-80%  stenosis. Some atherosclerotic plaque of the upper cervical ICA just proximal to the skull base but without stenosis more than about 20%. Left carotid system: Common carotid artery widely patent to the bifurcation. Minimal soft plaque in the ICA bulb. Calcified plaque at the more distal bulb and proximal cervical ICA. Minimal diameter in that location is 2.5 mm. Compared to a more distal cervical ICA diameter of 5 mm, this indicates a 50% stenosis. Vertebral arteries: Right vertebral artery origin is patent. Atherosclerotic narrowing just beyond the origin of 50-70%. The vessel is widely patent beyond that through the cervical region to the foramen magnum. Severe stenosis at the left vertebral artery origin, 80% or greater. Beyond that, the vessel is widely patent to the foramen magnum. Skeleton: Ordinary spondylosis.  Chronic fusion C5 through C7. Other neck: No mass or lymphadenopathy. Some atrophic change of the right lobe of the thyroid. This could also be postsurgical. Upper chest: Benign appearing lipoma of the left posterior paraspinal musculature. Review of the MIP images confirms the above findings CTA HEAD FINDINGS Anterior circulation: Both internal carotid arteries are patent through the skull base and siphon regions. There is atherosclerotic calcification with stenosis estimated at 30% on both sides. Stenosis disease of the supraclinoid internal carotid arteries is more severe on the right where stenosis is estimated at 70%. The anterior and middle cerebral vessels are patent. No large or medium vessel occlusion. Posterior circulation: Both vertebral arteries are patent through the foramen magnum. There is calcified plaque of the left vertebral artery V4 segment but without stenosis greater than 30%. Both vertebral arteries reach the basilar. No basilar stenosis. Superior cerebellar and posterior cerebral arteries are patent. Venous sinuses: Patent and normal. Anatomic variants: None significant. Review  of the MIP images confirms the above findings IMPRESSION: 1. Aortic atherosclerosis. 2. Vessel origins from the arch are not entirely included. 3. 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 Severe stenosis at the distal bulb with diameter of only 1 mm. Compared to a more distal cervical ICA diameter of 4.9 mm, this indicates a 75-80% stenosis. 4. Calcified plaque at the left ICA bulb and proximal cervical ICA. Minimal diameter in that location is 2.5 mm. Compared to a more distal cervical ICA diameter of 5 mm, this indicates a 50% stenosis. 5. Right vertebral artery origin is patent. Atherosclerotic narrowing just beyond the origin of 50-70%. Severe stenosis at the left vertebral artery origin, 80% or greater. 6. Atherosclerotic disease in both carotid siphon regions. 30% stenosis on both sides. Stenosis of the supraclinoid internal carotid arteries is more severe on the right where stenosis is estimated at 70%. 7. No intracranial large or medium vessel occlusion or correctable proximal stenosis. 8. 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 Aortic Atherosclerosis (ICD10-I70.0). Electronically Signed   By: Paulina Fusi M.D.   On:  03/09/2020 21:55   CT Angio Neck W and/or Wo Contrast  Result Date: 03/09/2020 CLINICAL DATA:  Right optic nerve edema. Abnormal vision right eye. Small embolic infarctions shown in the right cerebral hemisphere by MRI. EXAM: CT ANGIOGRAPHY HEAD AND NECK TECHNIQUE: Multidetector CT imaging of the head and neck was performed using the standard protocol during bolus administration of intravenous contrast. Multiplanar CT image reconstructions and MIPs were obtained to evaluate the vascular anatomy. Carotid stenosis measurements (when applicable) are obtained utilizing NASCET criteria, using the distal internal carotid diameter as the denominator. CONTRAST:   75mL OMNIPAQUE IOHEXOL 350 MG/ML SOLN COMPARISON:  MRI same day FINDINGS: CT HEAD FINDINGS Brain: Age related atrophy. Chronic small-vessel ischemic changes seen affecting the pons and cerebral hemispheric white matter. No sign of acute infarction by CT. No mass lesion, hemorrhage, hydrocephalus or extra-axial collection. Vascular: There is atherosclerotic calcification of the major vessels at the base of the brain. Skull: Negative Sinuses: Clear Orbits: Negative Review of the MIP images confirms the above findings CTA NECK FINDINGS Aortic arch: Aortic atherosclerosis. Vessel origins from the arch are not entirely included. Right carotid system: Common carotid artery widely patent to the bifurcation. Soft and calcified plaque in the ICA bulb. Severe stenosis at the distal bulb with diameter of only 1 mm. Compared to a more distal cervical ICA diameter of 4.9 mm, this indicates a 75-80% stenosis. Some atherosclerotic plaque of the upper cervical ICA just proximal to the skull base but without stenosis more than about 20%. Left carotid system: Common carotid artery widely patent to the bifurcation. Minimal soft plaque in the ICA bulb. Calcified plaque at the more distal bulb and proximal cervical ICA. Minimal diameter in that location is 2.5 mm. Compared to a more distal cervical ICA diameter of 5 mm, this indicates a 50% stenosis. Vertebral arteries: Right vertebral artery origin is patent. Atherosclerotic narrowing just beyond the origin of 50-70%. The vessel is widely patent beyond that through the cervical region to the foramen magnum. Severe stenosis at the left vertebral artery origin, 80% or greater. Beyond that, the vessel is widely patent to the foramen magnum. Skeleton: Ordinary spondylosis.  Chronic fusion C5 through C7. Other neck: No mass or lymphadenopathy. Some atrophic change of the right lobe of the thyroid. This could also be postsurgical. Upper chest: Benign appearing lipoma of the left posterior  paraspinal musculature. Review of the MIP images confirms the above findings CTA HEAD FINDINGS Anterior circulation: Both internal carotid arteries are patent through the skull base and siphon regions. There is atherosclerotic calcification with stenosis estimated at 30% on both sides. Stenosis disease of the supraclinoid internal carotid arteries is more severe on the right where stenosis is estimated at 70%. The anterior and middle cerebral vessels are patent. No large or medium vessel occlusion. Posterior circulation: Both vertebral arteries are patent through the foramen magnum. There is calcified plaque of the left vertebral artery V4 segment but without stenosis greater than 30%. Both vertebral arteries reach the basilar. No basilar stenosis. Superior cerebellar and posterior cerebral arteries are patent. Venous sinuses: Patent and normal. Anatomic variants: None significant. Review of the MIP images confirms the above findings IMPRESSION: 1. Aortic atherosclerosis. 2. Vessel origins from the arch are not entirely included. 3. 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 Severe stenosis at the distal bulb with diameter of only 1 mm. Compared to a more distal cervical ICA diameter of 4.9 mm, this indicates a 75-80% stenosis. 4. Calcified plaque at  the left ICA bulb and proximal cervical ICA. Minimal diameter in that location is 2.5 mm. Compared to a more distal cervical ICA diameter of 5 mm, this indicates a 50% stenosis. 5. Right vertebral artery origin is patent. Atherosclerotic narrowing just beyond the origin of 50-70%. Severe stenosis at the left vertebral artery origin, 80% or greater. 6. Atherosclerotic disease in both carotid siphon regions. 30% stenosis on both sides. Stenosis of the supraclinoid internal carotid arteries is more severe on the right where stenosis is estimated at 70%. 7. No intracranial large or medium vessel occlusion or correctable proximal stenosis. 8. In  this patient with micro embolic infarctions in the right hemisphere, the significant disease likely relates to the right-sided cervical ICA stenosis. Aortic Atherosclerosis (ICD10-I70.0). Electronically Signed   By: Paulina Fusi M.D.   On: 03/09/2020 21:55   MR Brain W and Wo Contrast  Result Date: 03/09/2020 CLINICAL DATA:  Monocular right sided vision loss.  Papilledema. EXAM: MRI HEAD AND ORBITS WITHOUT AND WITH CONTRAST TECHNIQUE: Multiplanar, multiecho pulse sequences of the brain and surrounding structures were obtained without and with intravenous contrast. Multiplanar, multiecho pulse sequences of the orbits and surrounding structures were obtained including fat saturation techniques, before and after intravenous contrast administration. CONTRAST:  58mL GADAVIST GADOBUTROL 1 MMOL/ML IV SOLN COMPARISON:  None. FINDINGS: MRI HEAD FINDINGS Brain: The study suffers from motion degradation despite the technologist's best efforts. Diffusion imaging shows punctate acute infarction in the right parieto-occipital subcortical white matter. Few other punctate foci of acute infarction at the right frontoparietal junction vertex. No large confluent infarction. Findings are consistent with micro embolic insults. Elsewhere, there chronic small-vessel ischemic changes of the pons. Few old small vessel cerebellar infarctions. Cerebral hemispheres show pronounced chronic small-vessel ischemic changes throughout the deep and subcortical white matter. No mass, hemorrhage, hydrocephalus or extra-axial collection. After contrast administration, no abnormal brain enhancement occurs. Vascular: Major vessels at the base of the brain show flow. Skull and upper cervical spine: Negative Other: None MRI ORBITS FINDINGS This study also suffers from motion degradation. Orbits: Both globes appear normal. Both optic nerves appear normal. Orbital fat is normal. Extra-ocular muscles are normal. Visualized sinuses: Clear except for minimal  left anterior ethmoid fluid. Soft tissues: Other regional soft tissues appear normal. IMPRESSION: 1. Motion degraded exam. 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 Few other punctate foci of acute infarction at the right frontoparietal junction vertex. Findings consistent with micro embolic insults from the right carotid, heart or ascending aorta. No swelling or hemorrhage. Certainly there could be micro embolic disease to the right orbit not visible by MRI. 2. 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 3. The orbital study itself is normal. Electronically Signed   By: Paulina Fusi M.D.   On: 03/09/2020 20:58   MR ORBITS W WO CONTRAST  Result Date: 03/09/2020 CLINICAL DATA:  Monocular right sided vision loss.  Papilledema. EXAM: MRI HEAD AND ORBITS WITHOUT AND WITH CONTRAST TECHNIQUE: Multiplanar, multiecho pulse sequences of the brain and surrounding structures were obtained without and with intravenous contrast. Multiplanar, multiecho pulse sequences of the orbits and surrounding structures were obtained including fat saturation techniques, before and after intravenous contrast administration. CONTRAST:  19mL GADAVIST GADOBUTROL 1 MMOL/ML IV SOLN COMPARISON:  None. FINDINGS: MRI HEAD FINDINGS Brain: The study suffers from motion degradation despite the technologist's best efforts. Diffusion imaging shows punctate acute infarction in the right parieto-occipital subcortical white matter. Few other punctate  foci of acute infarction at the right frontoparietal junction vertex. No large confluent infarction. Findings are consistent with micro embolic insults. Elsewhere, there chronic small-vessel ischemic changes of the pons. Few old small vessel cerebellar infarctions. Cerebral hemispheres show pronounced chronic small-vessel ischemic changes throughout the deep and subcortical white matter. No mass, hemorrhage,  hydrocephalus or extra-axial collection. After contrast administration, no abnormal brain enhancement occurs. Vascular: Major vessels at the base of the brain show flow. Skull and upper cervical spine: Negative Other: None MRI ORBITS FINDINGS This study also suffers from motion degradation. Orbits: Both globes appear normal. Both optic nerves appear normal. Orbital fat is normal. Extra-ocular muscles are normal. Visualized sinuses: Clear except for minimal left anterior ethmoid fluid. Soft tissues: Other regional soft tissues appear normal. IMPRESSION: 1. Motion degraded exam. 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 Few other punctate foci of acute infarction at the right frontoparietal junction vertex. Findings consistent with micro embolic insults from the right carotid, heart or ascending aorta. No swelling or hemorrhage. Certainly there could be micro embolic disease to the right orbit not visible by MRI. 2. 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 3. The orbital study itself is normal. Electronically Signed   By: Paulina Fusi M.D.   On: 03/09/2020 20:58   Scheduled Meds:  aspirin EC  325 mg Oral Daily   atorvastatin  40 mg Oral Daily   clopidogrel  300 mg Oral Once   [START ON 03/11/2020] clopidogrel  75 mg Oral Daily   enoxaparin (LOVENOX) injection  40 mg Subcutaneous Q24H     LOS: 0 days   Time spent:  Azucena Fallen, DO [facility]  If 7PM-7AM, please contact night-coverage [email or web address]  03/10/2020, 8:18 AM

## 2020-03-10 NOTE — Consult Note (Signed)
Chief Complaint: Patient was seen in consultation today for right ICA stenosis/revascularization.  Referring Physician(s): Marvel Plan (neurology)  Supervising Physician: Baldemar Lenis  Patient Status: [facility] - ED  History of Present Illness: Michael Wells is a 80 y.o. male with a past medical history of anxiety, sleep disturbance, and chronic pain. He presented to [facility] ED 03/09/2020 at the recommendation of Dr. Vonna Kotyk (ophthalmologist) for management of right eye vision changes (found to have potential right optic nerve edema). Per notes, patient describes vision changes as a dusty sand-like sensation and blurred vision, sometimes has to close right eye to see out of left eye (these symptoms wax and wane). In ED, MR brain revealed punctate acute infarcts in the right parieto-occipital white matter, CTA head/neck revealed severe right ICA stenosis (probable cause of acute infarcts). He was admitted for further management. Neurology was consulted who recommended NIR consult for possible right ICA stenting for management of symptomatic right ICA stenosis.  CTA head/neck 03/09/2020: 1. Aortic atherosclerosis. 2. Vessel origins from the arch are not entirely included. 3. 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 Severe stenosis at the distal bulb with diameter of only 1 mm. Compared to a more distal cervical ICA diameter of 4.9 mm, this indicates a 75-80% stenosis. 4. Calcified plaque at the left ICA bulb and proximal cervical ICA. Minimal diameter in that location is 2.5 mm. Compared to a more distal cervical ICA diameter of 5 mm, this indicates a 50% stenosis. 5. Right vertebral artery origin is patent. Atherosclerotic narrowing just beyond the origin of 50-70%. Severe stenosis at the left vertebral artery origin, 80% or greater. 6. Atherosclerotic disease in both carotid siphon regions. 30% stenosis on both sides. Stenosis of the supraclinoid internal  carotid arteries is more severe on the right where stenosis is estimated at 70%. 7. No intracranial large or medium vessel occlusion or correctable proximal stenosis. 8. 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  MR brain/orbits 03/09/2020: 1. Motion degraded exam. 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 Few other punctate foci of acute infarction at the right frontoparietal junction vertex. Findings consistent with micro embolic insults from the right carotid, heart or ascending aorta. No swelling or hemorrhage. Certainly there could be micro embolic disease to the right orbit not visible by MRI. 2. 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 3. The orbital study itself is normal.  NIR consulted by Dr. Roda Shutters for possible image-guided cerebral arteriogram with possible revascularization of right ICA stenosis. Patient awake and alert laying in bed with no complaints at this time. States his right vision loss has subsided at this time. Denies fever, chills, chest pain, dyspnea, abdominal pain, headache, weakness of extremities, or gait disturbances.  Currently on DAPT (loaded with Brilinta 180 mg today, on Aspirin 81 mg once daily).   Past Medical History:  Diagnosis Date  . Anxiety   . Chronic pain   . H/O sleep disturbance     History reviewed. No pertinent surgical history.  Allergies: Patient has no known allergies.  Medications: Prior to Admission medications   Medication Sig Start Date End Date Taking? Authorizing Provider  Aspirin-Acetaminophen-Caffeine (GOODY HEADACHE PO) Take 1 packet by mouth daily as needed (For headache).   Yes [provider]  HYDROcodone-acetaminophen (NORCO/VICODIN) 5-325 MG tablet Take 1 tablet by mouth every 6 (six)  hours as needed for  moderate pain.   Yes [provider]  Pseudoephedrine-Acetaminophen (SINUS PO) Take 1 tablet by mouth daily as needed (For sinus).   Yes [provider]     Family History  Problem Relation Age of Onset  . Cancer Mother        cervical  . Cancer Father        prostate  . Arthritis Sister   . Drug abuse Brother   . Mental retardation Brother   . Aneurysm Brother     Social History   Socioeconomic History  . Marital status: Married    Spouse name: Not on file  . Number of children: Not on file  . Years of education: Not on file  . Highest education level: Not on file  Occupational History  . Not on file  Tobacco Use  . Smoking status: Never Smoker  . Smokeless tobacco: Not on file  Substance and Sexual Activity  . Alcohol use: Yes    Alcohol/week: 0.0 standard drinks    Comment: socially 1-2 drinks month  . Drug use: No  . Sexual activity: Not on file  Other Topics Concern  . Not on file  Social History Narrative  . Not on file   Social Determinants of Health   Financial Resource Strain: Not on file  Food Insecurity: Not on file  Transportation Needs: Not on file  Physical Activity: Not on file  Stress: Not on file  Social Connections: Not on file     Review of Systems: A 12 point ROS discussed and pertinent positives are indicated in the HPI above.  All other systems are negative.  Review of Systems  Constitutional: Negative for chills and fever.  Eyes: Negative for visual disturbance.  Respiratory: Negative for shortness of breath and wheezing.   Cardiovascular: Negative for chest pain and palpitations.  Gastrointestinal: Negative for abdominal pain.  Musculoskeletal: Negative for gait problem.  Neurological: Negative for headaches.  Psychiatric/Behavioral: Negative for behavioral problems and confusion.    Vital Signs: BP (!) 139/94   Pulse 85   Temp 98 F (36.7 C) (Oral)   Resp 15   Ht 5\' 7"  (1.702 m)   Wt 170 lb (77.1 kg)    SpO2 95%   BMI 26.63 kg/m   Physical Exam Vitals and nursing note reviewed.  Constitutional:      General: He is not in acute distress. Cardiovascular:     Rate and Rhythm: Normal rate and regular rhythm.     Heart sounds: Normal heart sounds. No murmur heard.   Pulmonary:     Effort: Pulmonary effort is normal. No respiratory distress.     Breath sounds: Normal breath sounds. No wheezing.  Skin:    General: Skin is warm and dry.  Neurological:     Mental Status: He is alert and oriented to person, place, and time.     Comments: Can spontaneously move all extremities.      MD Evaluation Airway: WNL Heart: WNL Abdomen: WNL Chest/ Lungs: WNL ASA  Classification: 3 Mallampati/Airway Score: Two   Imaging: CT Angio Head W or Wo Contrast  Result Date: 03/09/2020 CLINICAL DATA:  Right optic nerve edema. Abnormal vision right eye. Small embolic infarctions shown in the right cerebral hemisphere by MRI. EXAM: CT ANGIOGRAPHY HEAD AND NECK TECHNIQUE: Multidetector CT imaging of the head and neck was performed using the standard protocol during bolus administration of intravenous contrast. Multiplanar CT image reconstructions and MIPs were  obtained to evaluate the vascular anatomy. Carotid stenosis measurements (when applicable) are obtained utilizing NASCET criteria, using the distal internal carotid diameter as the denominator. CONTRAST:  75mL OMNIPAQUE IOHEXOL 350 MG/ML SOLN COMPARISON:  MRI same day FINDINGS: CT HEAD FINDINGS Brain: Age related atrophy. Chronic small-vessel ischemic changes seen affecting the pons and cerebral hemispheric white matter. No sign of acute infarction by CT. No mass lesion, hemorrhage, hydrocephalus or extra-axial collection. Vascular: There is atherosclerotic calcification of the major vessels at the base of the brain. Skull: Negative Sinuses: Clear Orbits: Negative Review of the MIP images confirms the above findings CTA NECK FINDINGS Aortic arch: Aortic  atherosclerosis. Vessel origins from the arch are not entirely included. Right carotid system: Common carotid artery widely patent to the bifurcation. Soft and calcified plaque in the ICA bulb. Severe stenosis at the distal bulb with diameter of only 1 mm. Compared to a more distal cervical ICA diameter of 4.9 mm, this indicates a 75-80% stenosis. Some atherosclerotic plaque of the upper cervical ICA just proximal to the skull base but without stenosis more than about 20%. Left carotid system: Common carotid artery widely patent to the bifurcation. Minimal soft plaque in the ICA bulb. Calcified plaque at the more distal bulb and proximal cervical ICA. Minimal diameter in that location is 2.5 mm. Compared to a more distal cervical ICA diameter of 5 mm, this indicates a 50% stenosis. Vertebral arteries: Right vertebral artery origin is patent. Atherosclerotic narrowing just beyond the origin of 50-70%. The vessel is widely patent beyond that through the cervical region to the foramen magnum. Severe stenosis at the left vertebral artery origin, 80% or greater. Beyond that, the vessel is widely patent to the foramen magnum. Skeleton: Ordinary spondylosis.  Chronic fusion C5 through C7. Other neck: No mass or lymphadenopathy. Some atrophic change of the right lobe of the thyroid. This could also be postsurgical. Upper chest: Benign appearing lipoma of the left posterior paraspinal musculature. Review of the MIP images confirms the above findings CTA HEAD FINDINGS Anterior circulation: Both internal carotid arteries are patent through the skull base and siphon regions. There is atherosclerotic calcification with stenosis estimated at 30% on both sides. Stenosis disease of the supraclinoid internal carotid arteries is more severe on the right where stenosis is estimated at 70%. The anterior and middle cerebral vessels are patent. No large or medium vessel occlusion. Posterior circulation: Both vertebral arteries are patent  through the foramen magnum. There is calcified plaque of the left vertebral artery V4 segment but without stenosis greater than 30%. Both vertebral arteries reach the basilar. No basilar stenosis. Superior cerebellar and posterior cerebral arteries are patent. Venous sinuses: Patent and normal. Anatomic variants: None significant. Review of the MIP images confirms the above findings IMPRESSION: 1. Aortic atherosclerosis. 2. Vessel origins from the arch are not entirely included. 3. 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 Severe stenosis at the distal bulb with diameter of only 1 mm. Compared to a more distal cervical ICA diameter of 4.9 mm, this indicates a 75-80% stenosis. 4. Calcified plaque at the left ICA bulb and proximal cervical ICA. Minimal diameter in that location is 2.5 mm. Compared to a more distal cervical ICA diameter of 5 mm, this indicates a 50% stenosis. 5. Right vertebral artery origin is patent. Atherosclerotic narrowing just beyond the origin of 50-70%. Severe stenosis at the left vertebral artery origin, 80% or greater. 6. Atherosclerotic disease in both carotid siphon regions. 30% stenosis on both sides.  Stenosis of the supraclinoid internal carotid arteries is more severe on the right where stenosis is estimated at 70%. 7. No intracranial large or medium vessel occlusion or correctable proximal stenosis. 8. 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 Aortic Atherosclerosis (ICD10-I70.0). Electronically Signed   By: Paulina Fusi M.D.   On: 03/09/2020 21:55   CT Angio Neck W and/or Wo Contrast  Result Date: 03/09/2020 CLINICAL DATA:  Right optic nerve edema. Abnormal vision right eye. Small embolic infarctions shown in the right cerebral hemisphere by MRI. EXAM: CT ANGIOGRAPHY HEAD AND NECK TECHNIQUE: Multidetector CT imaging of the head and neck was  performed using the standard protocol during bolus administration of intravenous contrast. Multiplanar CT image reconstructions and MIPs were obtained to evaluate the vascular anatomy. Carotid stenosis measurements (when applicable) are obtained utilizing NASCET criteria, using the distal internal carotid diameter as the denominator. CONTRAST:  75mL OMNIPAQUE IOHEXOL 350 MG/ML SOLN COMPARISON:  MRI same day FINDINGS: CT HEAD FINDINGS Brain: Age related atrophy. Chronic small-vessel ischemic changes seen affecting the pons and cerebral hemispheric white matter. No sign of acute infarction by CT. No mass lesion, hemorrhage, hydrocephalus or extra-axial collection. Vascular: There is atherosclerotic calcification of the major vessels at the base of the brain. Skull: Negative Sinuses: Clear Orbits: Negative Review of the MIP images confirms the above findings CTA NECK FINDINGS Aortic arch: Aortic atherosclerosis. Vessel origins from the arch are not entirely included. Right carotid system: Common carotid artery widely patent to the bifurcation. Soft and calcified plaque in the ICA bulb. Severe stenosis at the distal bulb with diameter of only 1 mm. Compared to a more distal cervical ICA diameter of 4.9 mm, this indicates a 75-80% stenosis. Some atherosclerotic plaque of the upper cervical ICA just proximal to the skull base but without stenosis more than about 20%. Left carotid system: Common carotid artery widely patent to the bifurcation. Minimal soft plaque in the ICA bulb. Calcified plaque at the more distal bulb and proximal cervical ICA. Minimal diameter in that location is 2.5 mm. Compared to a more distal cervical ICA diameter of 5 mm, this indicates a 50% stenosis. Vertebral arteries: Right vertebral artery origin is patent. Atherosclerotic narrowing just beyond the origin of 50-70%. The vessel is widely patent beyond that through the cervical region to the foramen magnum. Severe stenosis at the left vertebral  artery origin, 80% or greater. Beyond that, the vessel is widely patent to the foramen magnum. Skeleton: Ordinary spondylosis.  Chronic fusion C5 through C7. Other neck: No mass or lymphadenopathy. Some atrophic change of the right lobe of the thyroid. This could also be postsurgical. Upper chest: Benign appearing lipoma of the left posterior paraspinal musculature. Review of the MIP images confirms the above findings CTA HEAD FINDINGS Anterior circulation: Both internal carotid arteries are patent through the skull base and siphon regions. There is atherosclerotic calcification with stenosis estimated at 30% on both sides. Stenosis disease of the supraclinoid internal carotid arteries is more severe on the right where stenosis is estimated at 70%. The anterior and middle cerebral vessels are patent. No large or medium vessel occlusion. Posterior circulation: Both vertebral arteries are patent through the foramen magnum. There is calcified plaque of the left vertebral artery V4 segment but without stenosis greater than 30%. Both vertebral arteries reach the basilar. No basilar stenosis. Superior cerebellar and posterior cerebral arteries are patent. Venous sinuses: Patent and normal.  Anatomic variants: None significant. Review of the MIP images confirms the above findings IMPRESSION: 1. Aortic atherosclerosis. 2. Vessel origins from the arch are not entirely included. 3. 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 Severe stenosis at the distal bulb with diameter of only 1 mm. Compared to a more distal cervical ICA diameter of 4.9 mm, this indicates a 75-80% stenosis. 4. Calcified plaque at the left ICA bulb and proximal cervical ICA. Minimal diameter in that location is 2.5 mm. Compared to a more distal cervical ICA diameter of 5 mm, this indicates a 50% stenosis. 5. Right vertebral artery origin is patent. Atherosclerotic narrowing just beyond the origin of 50-70%. Severe stenosis at the  left vertebral artery origin, 80% or greater. 6. Atherosclerotic disease in both carotid siphon regions. 30% stenosis on both sides. Stenosis of the supraclinoid internal carotid arteries is more severe on the right where stenosis is estimated at 70%. 7. No intracranial large or medium vessel occlusion or correctable proximal stenosis. 8. 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 Aortic Atherosclerosis (ICD10-I70.0). Electronically Signed   By: Paulina Fusi M.D.   On: 03/09/2020 21:55   MR Brain W and Wo Contrast  Result Date: 03/09/2020 CLINICAL DATA:  Monocular right sided vision loss.  Papilledema. EXAM: MRI HEAD AND ORBITS WITHOUT AND WITH CONTRAST TECHNIQUE: Multiplanar, multiecho pulse sequences of the brain and surrounding structures were obtained without and with intravenous contrast. Multiplanar, multiecho pulse sequences of the orbits and surrounding structures were obtained including fat saturation techniques, before and after intravenous contrast administration. CONTRAST:  51mL GADAVIST GADOBUTROL 1 MMOL/ML IV SOLN COMPARISON:  None. FINDINGS: MRI HEAD FINDINGS Brain: The study suffers from motion degradation despite the technologist's best efforts. Diffusion imaging shows punctate acute infarction in the right parieto-occipital subcortical white matter. Few other punctate foci of acute infarction at the right frontoparietal junction vertex. No large confluent infarction. Findings are consistent with micro embolic insults. Elsewhere, there chronic small-vessel ischemic changes of the pons. Few old small vessel cerebellar infarctions. Cerebral hemispheres show pronounced chronic small-vessel ischemic changes throughout the deep and subcortical white matter. No mass, hemorrhage, hydrocephalus or extra-axial collection. After contrast administration, no abnormal brain enhancement occurs.  Vascular: Major vessels at the base of the brain show flow. Skull and upper cervical spine: Negative Other: None MRI ORBITS FINDINGS This study also suffers from motion degradation. Orbits: Both globes appear normal. Both optic nerves appear normal. Orbital fat is normal. Extra-ocular muscles are normal. Visualized sinuses: Clear except for minimal left anterior ethmoid fluid. Soft tissues: Other regional soft tissues appear normal. IMPRESSION: 1. Motion degraded exam. 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 Few other punctate foci of acute infarction at the right frontoparietal junction vertex. Findings consistent with micro embolic insults from the right carotid, heart or ascending aorta. No swelling or hemorrhage. Certainly there could be micro embolic disease to the right orbit not visible by MRI. 2. 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 3. The orbital study itself is normal. Electronically Signed   By: Paulina Fusi M.D.   On: 03/09/2020 20:58   MR ORBITS W WO CONTRAST  Result Date: 03/09/2020 CLINICAL DATA:  Monocular right sided vision loss.  Papilledema. EXAM: MRI HEAD AND ORBITS WITHOUT AND WITH CONTRAST TECHNIQUE: Multiplanar, multiecho pulse sequences of the brain and surrounding structures were obtained without and  with intravenous contrast. Multiplanar, multiecho pulse sequences of the orbits and surrounding structures were obtained including fat saturation techniques, before and after intravenous contrast administration. CONTRAST:  8mL GADAVIST GADOBUTROL 1 MMOL/ML IV SOLN COMPARISON:  None. FINDINGS: MRI HEAD FINDINGS Brain: The study suffers from motion degradation despite the technologist's best efforts. Diffusion imaging shows punctate acute infarction in the right parieto-occipital subcortical white matter. Few other punctate foci of acute infarction at the right frontoparietal junction  vertex. No large confluent infarction. Findings are consistent with micro embolic insults. Elsewhere, there chronic small-vessel ischemic changes of the pons. Few old small vessel cerebellar infarctions. Cerebral hemispheres show pronounced chronic small-vessel ischemic changes throughout the deep and subcortical white matter. No mass, hemorrhage, hydrocephalus or extra-axial collection. After contrast administration, no abnormal brain enhancement occurs. Vascular: Major vessels at the base of the brain show flow. Skull and upper cervical spine: Negative Other: None MRI ORBITS FINDINGS This study also suffers from motion degradation. Orbits: Both globes appear normal. Both optic nerves appear normal. Orbital fat is normal. Extra-ocular muscles are normal. Visualized sinuses: Clear except for minimal left anterior ethmoid fluid. Soft tissues: Other regional soft tissues appear normal. IMPRESSION: 1. Motion degraded exam. 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 Few other punctate foci of acute infarction at the right frontoparietal junction vertex. Findings consistent with micro embolic insults from the right carotid, heart or ascending aorta. No swelling or hemorrhage. Certainly there could be micro embolic disease to the right orbit not visible by MRI. 2. 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 3. The orbital study itself is normal. Electronically Signed   By: Paulina Fusi M.D.   On: 03/09/2020 20:58    Labs:  CBC: Recent Labs    03/09/20 1600  WBC 7.1  HGB 15.0  HCT 43.9  PLT 171    COAGS: No results for input(s): INR, APTT in the last 8760 hours.  BMP: Recent Labs    03/09/20 1600  NA 138  K 3.9  CL 107  CO2 20*  GLUCOSE 95  BUN 11  CALCIUM 10.7*  CREATININE 1.03  GFRNONAA >60     Assessment and Plan:  History of right vision changes and punctate acute infarcts in the  right parieto-occipital white matter in setting of severe right ICA stenosis. Plan for image-guided cerebral arteriogram with possible revascularization (angioplasty, stent placement) of right ICA stenosis in IR tentatively for today with Dr Tommi Rumps Melchor Amour pending IR scheduling (tentatively booked with anesthesia for 1430). Patient is NPO. Afebrile and WBCs WNL. Ok to proceed with DAPT and Lovenox per Dr. Tommie Sams. INR pending. COVID negative 03/09/2020.  Risks and benefits of cerebral arteriogram with intervention were discussed with the patient including, but not limited to bleeding, infection, vascular injury, contrast induced renal failure, stroke, reperfusion hemorrhage, or even death. This interventional procedure involves the use of X-rays and because of the nature of the planned procedure, it is possible that we will have prolonged use of X-ray fluoroscopy. Potential radiation risks to you include (but are not limited to) the following: - A slightly elevated risk for cancer  several years later in life. This risk is typically less than 0.5% percent. This risk is low in comparison to the normal incidence of human cancer, which is 33% for women and 50% for men according to the [employer]. - Radiation induced injury can include skin redness, resembling a rash, tissue breakdown /  ulcers and hair loss (which can be temporary or permanent).  The likelihood of either of these occurring depends on the difficulty of the procedure and whether you are sensitive to radiation due to previous procedures, disease, or genetic conditions.  IF your procedure requires a prolonged use of radiation, you will be notified and given written instructions for further action.  It is your responsibility to monitor the irradiated area for the 2 weeks following the procedure and to notify your physician if you are concerned that you have suffered a radiation induced injury.   All of the  patient's questions were answered, patient is agreeable to proceed. Consent signed and in chart in ED (ED RN aware to transport with patient to [facility]).   Thank you for this interesting consult.  I greatly enjoyed meeting Michael Wells and look forward to participating in their care.  A copy of this report was sent to the requesting provider on this date.  Electronically Signed: Elwin Mocha, PA-C 03/10/2020, 9:56 AM   I spent a total of 40 Minutes in face to face in clinical consultation, greater than 50% of which was counseling/coordinating care for right ICA stenosis/revascularization.

## 2020-03-10 NOTE — Procedures (Signed)
INTERVENTIONAL NEURORADIOLOGY BRIEF POSTPROCEDURE NOTE  Diagnostic cerebral angiogram and right carotid stenting  Attending: Dr. Pedro Earls  Assistant: None.  Diagnosis: Roght carotid stenosis  Access site: RCFA 6F  Access closure: Perclose proglide  Anesthesia: MAC  Medication used: refer to anesthesia documentation.  Complications: None.  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 Carotid stenting performed with cerebral protection device and a 6-8x40 mm XACT stent. Follow up cervical and cranial angiograms showed no complication.  The patient tolerated the procedure well without incident or complication and is in stable condition.   Plan for bed rest x 6h. Continue on DAPT with ASA + brillinta. STAT head CT for any neuro changes.

## 2020-03-10 NOTE — ED Notes (Signed)
Pt wife at bedside.
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
## 2020-03-10 NOTE — Anesthesia Postprocedure Evaluation (Signed)
Anesthesia Post Note  Patient: Kenard Morawski Nishiyama  Procedure(s) Performed: IR WITH ANESTHESIA (N/A )     Patient location during evaluation: PACU Anesthesia Type: MAC Level of consciousness: awake and alert Pain management: pain level controlled Vital Signs Assessment: post-procedure vital signs reviewed and stable Respiratory status: spontaneous breathing, nonlabored ventilation, respiratory function stable and patient connected to nasal cannula oxygen Cardiovascular status: stable and blood pressure returned to baseline Postop Assessment: no apparent nausea or vomiting Anesthetic complications: no   No complications documented.  Last Vitals:  Vitals:   03/10/20 1915 03/10/20 1930  BP: 138/87 126/90  Pulse: 77 71  Resp: 13 13  Temp:    SpO2: 98% 99%    Last Pain:  Vitals:   03/10/20 1915  TempSrc:   PainSc: 0-No pain                 Effie Berkshire

## 2020-03-10 NOTE — ED Notes (Signed)
Pt resting comfortably. Awoken and spoken to and continues to express his desire to not be placed on cardiac monitor or "any of those wires or beepers" while is is trying to sleep.

## 2020-03-10 NOTE — ED Notes (Signed)
Hospitalist at bedside 

## 2020-03-10 NOTE — ED Notes (Addendum)
Pt upset, refusing to keep himself on monitoring equipment ( tele leads, pulse ox, bp cuff) , pt requesting to speak to doctor, tells this nurse he feels like he is in prison and may want to leave, neurology at bedside. Will continue to monitor.

## 2020-03-10 NOTE — Progress Notes (Signed)
  Echocardiogram 2D Echocardiogram has been performed.  Michael Wells F 03/10/2020, 2:47 PM

## 2020-03-10 NOTE — Anesthesia Procedure Notes (Signed)
Arterial Line Insertion Start/End2/25/2022 3:25 PM, 03/10/2020 3:29 PM Performed by: Dorie Rank, CRNA, CRNA  Preanesthetic checklist: patient identified, IV checked, monitors and equipment checked and pre-op evaluation Left, radial was placed Catheter size: 20 G Hand hygiene performed  and maximum sterile barriers used   Attempts: 1 Procedure performed without using ultrasound guided technique. Ultrasound Notes:anatomy identified Following insertion, dressing applied and Biopatch.

## 2020-03-10 NOTE — Sedation Documentation (Signed)
Right groin sheath removed. Perclose deployed. 

## 2020-03-10 NOTE — ED Notes (Addendum)
neurology MD tells this nurse, pt is willing to stay in hospital at this time.

## 2020-03-10 NOTE — ED Notes (Signed)
ECHO at bedside.

## 2020-03-10 NOTE — Anesthesia Preprocedure Evaluation (Addendum)
Anesthesia Evaluation  Patient identified by MRN, date of birth, ID band Patient awake  General Assessment Comment:Hx noted CG  Reviewed: Allergy & Precautions, NPO status , Patient's Chart, lab work & pertinent test results  Airway Mallampati: II  TM Distance: >3 FB     Dental   Pulmonary    breath sounds clear to auscultation       Cardiovascular negative cardio ROS   Rhythm:Regular Rate:Normal  Hx noted CG   Neuro/Psych Anxiety History of ICA disease Dr. Nyoka Cowden CVA    GI/Hepatic negative GI ROS, Neg liver ROS,   Endo/Other    Renal/GU negative Renal ROS     Musculoskeletal   Abdominal   Peds  Hematology   Anesthesia Other Findings   Reproductive/Obstetrics                            Anesthesia Physical Anesthesia Plan  ASA: III  Anesthesia Plan: MAC   Post-op Pain Management:    Induction: Intravenous  PONV Risk Score and Plan: 2 and Ondansetron and Dexamethasone  Airway Management Planned: Oral ETT, Nasal Cannula and Simple Face Mask  Additional Equipment:   Intra-op Plan:   Post-operative Plan:   Informed Consent: I have reviewed the patients History and Physical, chart, labs and discussed the procedure including the risks, benefits and alternatives for the proposed anesthesia with the patient or authorized representative who has indicated his/her understanding and acceptance.     Dental advisory given  Plan Discussed with: CRNA and Anesthesiologist  Anesthesia Plan Comments:        Anesthesia Quick Evaluation

## 2020-03-10 NOTE — Sedation Documentation (Signed)
Noted open skin areas on end of foreskin. Pt states he got his foreskin caught on pants zipper last week. No bleeding or drainage noted.

## 2020-03-10 NOTE — ED Notes (Signed)
Pt repeatedly taking self off monitor. Pt educated on reason for monitoring, but still refused. Will continue to monitor.

## 2020-03-10 NOTE — Transfer of Care (Signed)
Immediate Anesthesia Transfer of Care Note  Patient: Michael Wells  Procedure(s) Performed: IR WITH ANESTHESIA (N/A )  Patient Location: PACU  Anesthesia Type:MAC  Level of Consciousness: awake, alert  and oriented  Airway & Oxygen Therapy: Patient Spontanous Breathing and Patient connected to face mask oxygen  Post-op Assessment: Report given to RN and Patient moving all extremities X 4  Post vital signs: Reviewed and stable  Last Vitals:  Vitals Value Taken Time  BP 137/92 03/10/20 1814  Temp    Pulse 87 03/10/20 1816  Resp 14 03/10/20 1816  SpO2 100 % 03/10/20 1816  Vitals shown include unvalidated device data.  Last Pain:  Vitals:   03/10/20 0630  TempSrc: Oral  PainSc: 0-No pain         Complications: No complications documented.
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
## 2020-03-10 NOTE — ED Notes (Signed)
PT/OT at bedside.

## 2020-03-10 NOTE — Progress Notes (Addendum)
STROKE TEAM PROGRESS NOTE   INTERVAL HISTORY His RN is at the bedside.  Pt lying in bed, asking to go home. Still has right eye blurry vision with limited vision. 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 The stenosis is high for CEA, discussed with interventional neuroradiology Dr. Sherlon Handing and will attempt right ICA stenting today.  Keep n.p.o., will do aspirin 81 and Brilinta load for perioperative preparation.  Vitals:   03/10/20 0127 03/10/20 0520 03/10/20 0630 03/10/20 0736  BP: 123/85 132/87 130/78 (!) 139/94  Pulse: 72 84 76 85  Resp: 20 16 18 15   Temp:   98 F (36.7 C)   TempSrc:   Oral   SpO2: 98% 98% 97% 95%  Weight:      Height:       CBC:  Recent Labs  Lab 03/09/20 1600  WBC 7.1  HGB 15.0  HCT 43.9  MCV 98.4  PLT 171   Basic Metabolic Panel:  Recent Labs  Lab 03/09/20 1600  NA 138  K 3.9  CL 107  CO2 20*  GLUCOSE 95  BUN 11  CREATININE 1.03  CALCIUM 10.7*   Lipid Panel:  Recent Labs  Lab 03/10/20 0246  CHOL 145  TRIG 53  HDL 39*  CHOLHDL 3.7  VLDL 11  LDLCALC 95   HgbA1c:  Recent Labs  Lab 03/10/20 0245  HGBA1C 5.6   Urine Drug Screen: No results for input(s): LABOPIA, COCAINSCRNUR, LABBENZ, AMPHETMU, THCU, LABBARB in the last 168 hours.  Alcohol Level No results for input(s): ETH in the last 168 hours.  IMAGING past 24 hours CT Angio Head W or Wo Contrast  Result Date: 03/09/2020 CLINICAL DATA:  Right optic nerve edema. Abnormal vision right eye. Small embolic infarctions shown in the right cerebral hemisphere by MRI. EXAM: CT ANGIOGRAPHY HEAD AND NECK TECHNIQUE: Multidetector CT imaging of the head and neck was performed using the standard protocol during bolus administration of intravenous contrast. Multiplanar CT image reconstructions and MIPs were obtained to evaluate the vascular anatomy. Carotid stenosis measurements (when applicable) are obtained utilizing NASCET criteria, using the distal  internal carotid diameter as the denominator. CONTRAST:  87mL OMNIPAQUE IOHEXOL 350 MG/ML SOLN COMPARISON:  MRI same day FINDINGS: CT HEAD FINDINGS Brain: Age related atrophy. Chronic small-vessel ischemic changes seen affecting the pons and cerebral hemispheric white matter. No sign of acute infarction by CT. No mass lesion, hemorrhage, hydrocephalus or extra-axial collection. Vascular: There is atherosclerotic calcification of the major vessels at the base of the brain. Skull: Negative Sinuses: Clear Orbits: Negative Review of the MIP images confirms the above findings CTA NECK FINDINGS Aortic arch: Aortic atherosclerosis. Vessel origins from the arch are not entirely included. Right carotid system: Common carotid artery widely patent to the bifurcation. Soft and calcified plaque in the ICA bulb. Severe stenosis at the distal bulb with diameter of only 1 mm. Compared to a more distal cervical ICA diameter of 4.9 mm, this indicates a 75-80% stenosis. Some atherosclerotic plaque of the upper cervical ICA just proximal to the skull base but without stenosis more than about 20%. Left carotid system: Common carotid artery widely patent to the bifurcation. Minimal soft plaque in the ICA bulb. Calcified plaque at the more distal bulb and proximal cervical ICA. Minimal diameter in that location is 2.5 mm. Compared to a more distal cervical ICA diameter of 5 mm, this indicates a 50% stenosis. Vertebral arteries: Right vertebral artery origin is patent. Atherosclerotic narrowing  just beyond the origin of 50-70%. The vessel is widely patent beyond that through the cervical region to the foramen magnum. Severe stenosis at the left vertebral artery origin, 80% or greater. Beyond that, the vessel is widely patent to the foramen magnum. Skeleton: Ordinary spondylosis.  Chronic fusion C5 through C7. Other neck: No mass or lymphadenopathy. Some atrophic change of the right lobe of the thyroid. This could also be postsurgical.  Upper chest: Benign appearing lipoma of the left posterior paraspinal musculature. Review of the MIP images confirms the above findings CTA HEAD FINDINGS Anterior circulation: Both internal carotid arteries are patent through the skull base and siphon regions. There is atherosclerotic calcification with stenosis estimated at 30% on both sides. Stenosis disease of the supraclinoid internal carotid arteries is more severe on the right where stenosis is estimated at 70%. The anterior and middle cerebral vessels are patent. No large or medium vessel occlusion. Posterior circulation: Both vertebral arteries are patent through the foramen magnum. There is calcified plaque of the left vertebral artery V4 segment but without stenosis greater than 30%. Both vertebral arteries reach the basilar. No basilar stenosis. Superior cerebellar and posterior cerebral arteries are patent. Venous sinuses: Patent and normal. Anatomic variants: None significant. Review of the MIP images confirms the above findings IMPRESSION: 1. Aortic atherosclerosis. 2. Vessel origins from the arch are not entirely included. 3. 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 Severe stenosis at the distal bulb with diameter of only 1 mm. Compared to a more distal cervical ICA diameter of 4.9 mm, this indicates a 75-80% stenosis. 4. Calcified plaque at the left ICA bulb and proximal cervical ICA. Minimal diameter in that location is 2.5 mm. Compared to a more distal cervical ICA diameter of 5 mm, this indicates a 50% stenosis. 5. Right vertebral artery origin is patent. Atherosclerotic narrowing just beyond the origin of 50-70%. Severe stenosis at the left vertebral artery origin, 80% or greater. 6. Atherosclerotic disease in both carotid siphon regions. 30% stenosis on both sides. Stenosis of the supraclinoid internal carotid arteries is more severe on the right where stenosis is estimated at 70%. 7. No intracranial large or medium  vessel occlusion or correctable proximal stenosis. 8. 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 Aortic Atherosclerosis (ICD10-I70.0). Electronically Signed   By: Paulina Fusi M.D.   On: 03/09/2020 21:55   CT Angio Neck W and/or Wo Contrast  Result Date: 03/09/2020 CLINICAL DATA:  Right optic nerve edema. Abnormal vision right eye. Small embolic infarctions shown in the right cerebral hemisphere by MRI. EXAM: CT ANGIOGRAPHY HEAD AND NECK TECHNIQUE: Multidetector CT imaging of the head and neck was performed using the standard protocol during bolus administration of intravenous contrast. Multiplanar CT image reconstructions and MIPs were obtained to evaluate the vascular anatomy. Carotid stenosis measurements (when applicable) are obtained utilizing NASCET criteria, using the distal internal carotid diameter as the denominator. CONTRAST:  68mL OMNIPAQUE IOHEXOL 350 MG/ML SOLN COMPARISON:  MRI same day FINDINGS: CT HEAD FINDINGS Brain: Age related atrophy. Chronic small-vessel ischemic changes seen affecting the pons and cerebral hemispheric white matter. No sign of acute infarction by CT. No mass lesion, hemorrhage, hydrocephalus or extra-axial collection. Vascular: There is atherosclerotic calcification of the major vessels at the base of the brain. Skull: Negative Sinuses: Clear Orbits: Negative Review of the MIP images confirms the above findings CTA NECK FINDINGS Aortic arch: Aortic atherosclerosis.  Vessel origins from the arch are not entirely included. Right carotid system: Common carotid artery widely patent to the bifurcation. Soft and calcified plaque in the ICA bulb. Severe stenosis at the distal bulb with diameter of only 1 mm. Compared to a more distal cervical ICA diameter of 4.9 mm, this indicates a 75-80% stenosis. Some atherosclerotic plaque of the upper cervical ICA just proximal to the  skull base but without stenosis more than about 20%. Left carotid system: Common carotid artery widely patent to the bifurcation. Minimal soft plaque in the ICA bulb. Calcified plaque at the more distal bulb and proximal cervical ICA. Minimal diameter in that location is 2.5 mm. Compared to a more distal cervical ICA diameter of 5 mm, this indicates a 50% stenosis. Vertebral arteries: Right vertebral artery origin is patent. Atherosclerotic narrowing just beyond the origin of 50-70%. The vessel is widely patent beyond that through the cervical region to the foramen magnum. Severe stenosis at the left vertebral artery origin, 80% or greater. Beyond that, the vessel is widely patent to the foramen magnum. Skeleton: Ordinary spondylosis.  Chronic fusion C5 through C7. Other neck: No mass or lymphadenopathy. Some atrophic change of the right lobe of the thyroid. This could also be postsurgical. Upper chest: Benign appearing lipoma of the left posterior paraspinal musculature. Review of the MIP images confirms the above findings CTA HEAD FINDINGS Anterior circulation: Both internal carotid arteries are patent through the skull base and siphon regions. There is atherosclerotic calcification with stenosis estimated at 30% on both sides. Stenosis disease of the supraclinoid internal carotid arteries is more severe on the right where stenosis is estimated at 70%. The anterior and middle cerebral vessels are patent. No large or medium vessel occlusion. Posterior circulation: Both vertebral arteries are patent through the foramen magnum. There is calcified plaque of the left vertebral artery V4 segment but without stenosis greater than 30%. Both vertebral arteries reach the basilar. No basilar stenosis. Superior cerebellar and posterior cerebral arteries are patent. Venous sinuses: Patent and normal. Anatomic variants: None significant. Review of the MIP images confirms the above findings IMPRESSION: 1. Aortic atherosclerosis.  2. Vessel origins from the arch are not entirely included. 3. 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 Severe stenosis at the distal bulb with diameter of only 1 mm. Compared to a more distal cervical ICA diameter of 4.9 mm, this indicates a 75-80% stenosis. 4. Calcified plaque at the left ICA bulb and proximal cervical ICA. Minimal diameter in that location is 2.5 mm. Compared to a more distal cervical ICA diameter of 5 mm, this indicates a 50% stenosis. 5. Right vertebral artery origin is patent. Atherosclerotic narrowing just beyond the origin of 50-70%. Severe stenosis at the left vertebral artery origin, 80% or greater. 6. Atherosclerotic disease in both carotid siphon regions. 30% stenosis on both sides. Stenosis of the supraclinoid internal carotid arteries is more severe on the right where stenosis is estimated at 70%. 7. No intracranial large or medium vessel occlusion or correctable proximal stenosis. 8. 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 Aortic Atherosclerosis (ICD10-I70.0). Electronically Signed   By: Paulina Fusi M.D.   On: 03/09/2020 21:55   MR Brain W and Wo Contrast  Result Date: 03/09/2020 CLINICAL DATA:  Monocular right sided vision loss.  Papilledema. EXAM: MRI HEAD AND ORBITS WITHOUT AND WITH CONTRAST TECHNIQUE: Multiplanar, multiecho pulse sequences of the brain and  surrounding structures were obtained without and with intravenous contrast. Multiplanar, multiecho pulse sequences of the orbits and surrounding structures were obtained including fat saturation techniques, before and after intravenous contrast administration. CONTRAST:  8mL GADAVIST GADOBUTROL 1 MMOL/ML IV SOLN COMPARISON:  None. FINDINGS: MRI HEAD FINDINGS Brain: The study suffers from motion degradation despite the technologist's best efforts. Diffusion imaging shows punctate acute  infarction in the right parieto-occipital subcortical white matter. Few other punctate foci of acute infarction at the right frontoparietal junction vertex. No large confluent infarction. Findings are consistent with micro embolic insults. Elsewhere, there chronic small-vessel ischemic changes of the pons. Few old small vessel cerebellar infarctions. Cerebral hemispheres show pronounced chronic small-vessel ischemic changes throughout the deep and subcortical white matter. No mass, hemorrhage, hydrocephalus or extra-axial collection. After contrast administration, no abnormal brain enhancement occurs. Vascular: Major vessels at the base of the brain show flow. Skull and upper cervical spine: Negative Other: None MRI ORBITS FINDINGS This study also suffers from motion degradation. Orbits: Both globes appear normal. Both optic nerves appear normal. Orbital fat is normal. Extra-ocular muscles are normal. Visualized sinuses: Clear except for minimal left anterior ethmoid fluid. Soft tissues: Other regional soft tissues appear normal. IMPRESSION: 1. Motion degraded exam. 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 Few other punctate foci of acute infarction at the right frontoparietal junction vertex. Findings consistent with micro embolic insults from the right carotid, heart or ascending aorta. No swelling or hemorrhage. Certainly there could be micro embolic disease to the right orbit not visible by MRI. 2. 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 3. The orbital study itself is normal. Electronically Signed   By: Paulina Fusi M.D.   On: 03/09/2020 20:58   MR ORBITS W WO CONTRAST  Result Date: 03/09/2020 CLINICAL DATA:  Monocular right sided vision loss.  Papilledema. EXAM: MRI HEAD AND ORBITS WITHOUT AND WITH CONTRAST TECHNIQUE: Multiplanar, multiecho pulse sequences of the brain and surrounding structures were  obtained without and with intravenous contrast. Multiplanar, multiecho pulse sequences of the orbits and surrounding structures were obtained including fat saturation techniques, before and after intravenous contrast administration. CONTRAST:  8mL GADAVIST GADOBUTROL 1 MMOL/ML IV SOLN COMPARISON:  None. FINDINGS: MRI HEAD FINDINGS Brain: The study suffers from motion degradation despite the technologist's best efforts. Diffusion imaging shows punctate acute infarction in the right parieto-occipital subcortical white matter. Few other punctate foci of acute infarction at the right frontoparietal junction vertex. No large confluent infarction. Findings are consistent with micro embolic insults. Elsewhere, there chronic small-vessel ischemic changes of the pons. Few old small vessel cerebellar infarctions. Cerebral hemispheres show pronounced chronic small-vessel ischemic changes throughout the deep and subcortical white matter. No mass, hemorrhage, hydrocephalus or extra-axial collection. After contrast administration, no abnormal brain enhancement occurs. Vascular: Major vessels at the base of the brain show flow. Skull and upper cervical spine: Negative Other: None MRI ORBITS FINDINGS This study also suffers from motion degradation. Orbits: Both globes appear normal. Both optic nerves appear normal. Orbital fat is normal. Extra-ocular muscles are normal. Visualized sinuses: Clear except for minimal left anterior ethmoid fluid. Soft tissues: Other regional soft tissues appear normal. IMPRESSION: 1. Motion degraded exam. 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 Few other punctate foci of acute infarction at the right frontoparietal junction vertex. Findings consistent with micro embolic insults from the right carotid, heart or ascending aorta. No swelling or hemorrhage. Certainly there could be micro  embolic disease to the right orbit not visible by MRI. 2. Extensive chronic  small-vessel ischemic changes elsewhere throughout the brain with chronic volume loss. 3. The orbital study itself is normal. Electronically Signed   By: Paulina Fusi M.D.   On: 03/09/2020 20:58    PHYSICAL EXAM  Temp:  [97.5 F (36.4 C)-98 F (36.7 C)] 98 F (36.7 C) (02/25 0630) Pulse Rate:  [62-94] 87 (02/25 1400) Resp:  [14-22] 14 (02/25 1400) BP: (109-157)/(78-101) 109/83 (02/25 1400) SpO2:  [95 %-100 %] 98 % (02/25 1400) Weight:  [77.1 kg] 77.1 kg (02/24 1539)  General - Well nourished, well developed, in no apparent distress.  Ophthalmologic - fundi not visualized due to noncooperation.  Cardiovascular - Regular rhythm and rate.  Mental Status -  Level of arousal and orientation to time, place, and person were intact. Language including expression, naming, repetition, comprehension was assessed and found intact. Fund of Knowledge was assessed and was intact.  Cranial Nerves II - XII - II - Visual field intact OS.  Limited vision OD, barely see hand waving. III, IV, VI - Extraocular movements intact. V - Facial sensation intact bilaterally. VII - Facial movement intact bilaterally. VIII - Hearing & vestibular intact bilaterally. X - Palate elevates symmetrically. XI - Chin turning & shoulder shrug intact bilaterally. XII - Tongue protrusion intact.  Motor Strength - The patient's strength was normal in all extremities and pronator drift was absent.  Bulk was normal and fasciculations were absent.   Motor Tone - Muscle tone was assessed at the neck and appendages and was normal.  Reflexes - The patient's reflexes were symmetrical in all extremities and he had no pathological reflexes.  Sensory - Light touch, temperature/pinprick were assessed and were symmetrical.    Coordination - The patient had normal movements in the hands and feet with no ataxia or dysmetria.  Tremor was absent.  Gait and Station - deferred.   ASSESSMENT/PLAN Mr. GRADYN SHEIN is a 80 y.o.  male with history of chronic pain presenting with right eye vision loss.   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 Few other punctate foci of acute infarction at the right frontoparietal junction vertex  CTA head and neck with severe narrowing of the right mid carotid artery, 75 to 80% stenosis. Left ICA less severely affected with approximately 50% stenosis. Additional atherosclerotic involvement of both vertebral arteries, left greater than right, as well as significant intracranial atherosclerosis  2D Echo pending  LDL 95  HgbA1c 5.6  VTE prophylaxis -Lovenox  No antithrombotic prior to admission, now on aspirin 81 mg daily and Brilinta (ticagrelor) 90 mg bid after 180 mg load.   Therapy recommendations: Pending  Disposition: Pending  Carotid stenosis  CTA head and neck with severe narrowing of the right mid carotid artery, 75 to 80% stenosis. Left ICA less severely affected with approximately 50% stenosis.   Right ICA lesion too high for CEA  Discussed with Dr. Sherlon Handing interventional neuroradiology, mild to right ICA stenting.  On aspirin and graded at DAPT.  Hypertension  Home meds: None  Stable . Long-term BP goal per interventional neuroradiology  Hyperlipidemia  Home meds: None   LDL 95, goal < 70  On Lipitor 40  Continue statin at discharge  Other Stroke Risk Factors  Advanced Age >/= 75   ETOH use, advised  to drink no more than 2 drink(s) a day  Other Active Problems    Hospital day # 0  Marvel PlanJindong Sally Reimers, MD PhD Stroke Neurology 03/10/2020 3:18 PM    To contact Stroke Continuity provider, please refer to [email or web address]. After hours, contact General Neurology

## 2020-03-10 NOTE — ED Provider Notes (Signed)
Emergency Medicine Observation Re-evaluation Note  Michael Wells is a 80 y.o. male, seen on rounds today.  Pt initially presented to the ED for complaints of Eye Problem Currently, the patient is admitted to the hospitalist team under Dr Fortunato Curling care and is being further evaluated for infarction in the right paroxysmal lobe.  Lab work and imaging has been reviewed, vital signs are remained stable.   Physical Exam  BP (!) 139/94   Pulse 85   Temp 98 F (36.7 C) (Oral)   Resp 15   Ht 5\' 7"  (1.702 m)   Wt 77.1 kg   SpO2 95%   BMI 26.63 kg/m  Physical Exam General: Resting comfortably, does not appear in acute distress Cardiac: Heart rate has remained within normal limits. Lungs: Nonlabored breathing.   ED Course / MDM  EKG:    I have reviewed the labs performed to date as well as medications administered while in observation. no  Recent changes in the last 24 hours.   Plan  Current plan is for under hospitalist care.    , PA-C 03/10/20 1005    03/12/20, MD 03/10/20 [phone]

## 2020-03-11 DIAGNOSIS — I639 Cerebral infarction, unspecified: Secondary | ICD-10-CM | POA: Diagnosis not present

## 2020-03-11 DIAGNOSIS — Z95828 Presence of other vascular implants and grafts: Secondary | ICD-10-CM | POA: Diagnosis not present

## 2020-03-11 DIAGNOSIS — H539 Unspecified visual disturbance: Secondary | ICD-10-CM | POA: Diagnosis not present

## 2020-03-11 DIAGNOSIS — I10 Essential (primary) hypertension: Secondary | ICD-10-CM

## 2020-03-11 DIAGNOSIS — I6521 Occlusion and stenosis of right carotid artery: Secondary | ICD-10-CM | POA: Diagnosis not present

## 2020-03-11 DIAGNOSIS — I63411 Cerebral infarction due to embolism of right middle cerebral artery: Secondary | ICD-10-CM | POA: Diagnosis not present

## 2020-03-11 DIAGNOSIS — E785 Hyperlipidemia, unspecified: Secondary | ICD-10-CM

## 2020-03-11 LAB — COMPREHENSIVE METABOLIC PANEL
ALT: 20 U/L (ref 0–44)
AST: 32 U/L (ref 15–41)
Albumin: 3 g/dL — ABNORMAL LOW (ref 3.5–5.0)
Alkaline Phosphatase: 97 U/L (ref 38–126)
Anion gap: 9 (ref 5–15)
BUN: 10 mg/dL (ref 8–23)
CO2: 18 mmol/L — ABNORMAL LOW (ref 22–32)
Calcium: 10.3 mg/dL (ref 8.9–10.3)
Chloride: 108 mmol/L (ref 98–111)
Creatinine, Ser: 0.87 mg/dL (ref 0.61–1.24)
GFR, Estimated: >60 mL/min (ref >60)
Glucose, Bld: 121 mg/dL — ABNORMAL HIGH (ref 70–99)
Potassium: 3.5 mmol/L (ref 3.5–5.1)
Sodium: 135 mmol/L (ref 135–145)
Total Bilirubin: 2.2 mg/dL — ABNORMAL HIGH (ref 0.3–1.2)
Total Protein: 6.4 g/dL — ABNORMAL LOW (ref 6.5–8.1)

## 2020-03-11 LAB — CBC
HCT: 37.3 % — ABNORMAL LOW (ref 39.0–52.0)
Hemoglobin: 12.5 g/dL — ABNORMAL LOW (ref 13.0–17.0)
MCH: 32.6 pg (ref 26.0–34.0)
MCHC: 33.5 g/dL (ref 30.0–36.0)
MCV: 97.4 fL (ref 80.0–100.0)
Platelets: 156 10*3/uL (ref 150–400)
RBC: 3.83 MIL/uL — ABNORMAL LOW (ref 4.22–5.81)
RDW: 13.3 % (ref 11.5–15.5)
WBC: 7.9 10*3/uL (ref 4.0–10.5)
nRBC: 0 % (ref 0.0–0.2)

## 2020-03-11 LAB — MAGNESIUM: Magnesium: 1.8 mg/dL (ref 1.7–2.4)

## 2020-03-11 LAB — GLUCOSE, CAPILLARY
Glucose-Capillary: 102 mg/dL — ABNORMAL HIGH (ref 70–99)
Glucose-Capillary: 105 mg/dL — ABNORMAL HIGH (ref 70–99)
Glucose-Capillary: 85 mg/dL (ref 70–99)

## 2020-03-11 LAB — TROPONIN I (HIGH SENSITIVITY)
Troponin I (High Sensitivity): 8 ng/L (ref <18)
Troponin I (High Sensitivity): 9 ng/L (ref <18)

## 2020-03-11 MED ORDER — DEXTROSE IN LACTATED RINGERS 5 % IV SOLN: INTRAVENOUS | Status: DC

## 2020-03-11 MED ORDER — LACTATED RINGERS IV SOLN: INTRAVENOUS | Status: DC

## 2020-03-11 MED ORDER — ATORVASTATIN CALCIUM 40 MG PO TABS: 40.0000 mg | ORAL_TABLET | Freq: Every day | ORAL | 1 refills | Status: AC

## 2020-03-11 MED ORDER — SODIUM CHLORIDE 0.9 % IV SOLN: 250.0000 mL | INTRAVENOUS | Status: DC

## 2020-03-11 MED ORDER — ASPIRIN 81 MG PO TBEC: 81.0000 mg | DELAYED_RELEASE_TABLET | Freq: Every day | ORAL | 11 refills | Status: DC

## 2020-03-11 MED ORDER — TICAGRELOR 90 MG PO TABS: 90.0000 mg | ORAL_TABLET | Freq: Two times a day (BID) | ORAL | 1 refills | Status: DC

## 2020-03-11 MED ORDER — ATORVASTATIN CALCIUM 40 MG PO TABS: 40.0000 mg | ORAL_TABLET | Freq: Every day | ORAL | 1 refills | Status: DC

## 2020-03-11 MED ORDER — PHENYLEPHRINE HCL-NACL 10-0.9 MG/250ML-% IV SOLN: 25.0000 ug/min | INTRAVENOUS | Status: DC

## 2020-03-11 MED ORDER — ASPIRIN 81 MG PO TBEC: 81.0000 mg | DELAYED_RELEASE_TABLET | Freq: Every day | ORAL | 11 refills | Status: AC

## 2020-03-11 MED ORDER — LACTATED RINGERS IV BOLUS
500.0000 mL | Freq: Once | INTRAVENOUS | Status: AC
  Administered 2020-03-11: 500 mL via INTRAVENOUS
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
## 2020-03-11 NOTE — Progress Notes (Signed)
STROKE TEAM PROGRESS NOTE   INTERVAL HISTORY His RN is at the bedside.  Pt lying in bed, asking to go home. Still has right eye blurry vision with limited vision. 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 The stenosis is high for CEA, discussed with interventional neuroradiology Dr. Norma Fredrickson who placed right ICA stenting. Now on aspirin 81 and Brilinta.  Vitals:   03/11/20 0900 03/11/20 1000 03/11/20 1100 03/11/20 1200  BP: 126/87 139/73 137/70 136/66  Pulse: 74 68 61 63  Resp: _0 Temp:   97.8 F (36.6 C)   TempSrc:   Oral   SpO2: 95% 96%  96%  Weight:      Height:       CBC:  Recent Labs  Lab 03/10/20 2337 03/11/20 0337  WBC 8.4 7.9  NEUTROABS 6.4  --   HGB 12.7* 12.5*  HCT 37.3* 37.3*  MCV 97.1 97.4  PLT 157 892   Basic Metabolic Panel:  Recent Labs  Lab 03/09/20 1600 03/11/20 0127 03/11/20 0337  NA 138 135  --   K 3.9 3.5  --   CL 107 108  --   CO2 20* 18*  --   GLUCOSE 95 121*  --   BUN 11 10  --   CREATININE 1.03 0.87  --   CALCIUM 10.7* 10.3  --   MG  --   --  1.8   Lipid Panel:  Recent Labs  Lab 03/10/20 0246  CHOL 145  TRIG 53  HDL 39*  CHOLHDL 3.7  VLDL 11  LDLCALC 95   HgbA1c:  Recent Labs  Lab 03/10/20 0245  HGBA1C 5.6   Urine Drug Screen: No results for input(s): LABOPIA, COCAINSCRNUR, LABBENZ, AMPHETMU, THCU, LABBARB in the last 168 hours.  Alcohol Level No results for input(s): ETH in the last 168 hours.  IMAGING past 24 hours IR INTRAVSC STENT CERV CAROTID W/EMB-PROT MOD SED  Result Date: 03/10/2020 INDICATION: 80 year old male with past medical history significant for chronic pain. He presented with right-sided vision loss. 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 He was transferred to our service for a diagnostic cerebral angiogram with carotid stenting. In anticipation to this  procedure patient was loaded on aspirin and Brilinta. EXAM: Ultrasound-guided vascular access Diagnostic cerebral angiogram Carotid stenting with cerebral protection device COMPARISON:  CT/CT angiogram of the head and neck March 09, 2020. MEDICATIONS: A total of 6,000 units of heparin IV. ANESTHESIA/SEDATION: The procedure was performed under monitored anesthesia care. CONTRAST:  60 mL of Omnipaque 300 mg/mL FLUOROSCOPY TIME:  Fluoroscopy Time: 27 minutes 48 seconds (541.3 mGy). COMPLICATIONS: None immediate. TECHNIQUE: Informed written consent was obtained from the patient and his wife after a thorough discussion of the procedural risks, benefits and alternatives. All questions were addressed. Maximal Sterile Barrier Technique was utilized including caps, mask, sterile gowns, sterile gloves, sterile drape, hand hygiene and skin antiseptic. A timeout was performed prior to the initiation of the procedure. The right groin was prepped and draped in the usual sterile fashion. Using a micropuncture kit and the modified Seldinger technique, access was gained to the right common femoral artery and an 8 French sheath was placed. Real-time ultrasound guidance was utilized for vascular access including the acquisition of a permanent ultrasound image documenting patency of the accessed vessel. Under fluoroscopy, a 5 [place] Berenstein 2 catheter  and a 0.035" Terumo Glidewire were navigated into the aortic arch. The catheter was placed into the common origin of innominate and left common carotid artery. Frontal angiograms of the neck were obtained. The catheter was then placed into the left subclavian artery. Frontal angiogram of the neck were obtained. The catheter was then advanced into the right common carotid artery. Frontal and lateral angiograms of the neck were obtained followed by frontal and lateral angiograms of the head. FINDINGS: 1. Soft plaque in the cervical right ICA just distal to the bulb resulting in  approximately 75-80% stenosis. Irregularity with ulceration at the carotid bulb. 2. 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 Slow opacification of intracranial branches noted. 3. Patent right common femoral artery. PROCEDURE: The Berenstein 2 catheter was exchanged over the wire and under biplane roadmap for a 6 [place] shuttle sheath. Frontal and lateral angiograms of the neck were obtained. Using biplane roadmap a 2.5-4.8 Emboshield NAV 6 cerebral protection device was advanced into the distal cervical segment of the right ICA. Subsequently, a 6-8 x 40 mm XACT carotid stent was deployed in the cervical right ICA from the origin of the bulb to the mid cervical segment, covering the bulb ulceration and focal stenosis. Follow-up angiogram showed adequate stent deployment with resolution of stenosis and no evidence of complication. The cerebral protection device was subsequently recaptured. Delayed angiograms with frontal and lateral views of the neck showed no evidence of in stent platelet aggregation. Right common carotid artery angiograms with frontal and lateral views of the head showed significant improvement of the anterograde flow to the right anterior circulation. Right common femoral artery angiograms were obtained with right anterior oblique and lateral views. The puncture is at the level of the common femoral artery which has adequate caliber. The femoral sheath was exchanged for a Perclose ProGlide which was utilized for access closure. Immediate hemostasis was achieved. IMPRESSION: Successful and uncomplicated cervical right internal carotid artery stenting with cerebral protection device for treatment of symptomatic stenosis. PLAN: Continue on dual anti-platelet therapy with Brilinta and aspirin. Follow-up carotid duplex at three-month postprocedure. Electronically Signed   By: Pedro Earls M.D.   On: 03/10/2020 18:43   IR US  Guide Vasc Access Right  Result Date: 03/10/2020 INDICATION: 80 year old male with past medical history significant for chronic pain. He presented with right-sided vision loss. 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 He was transferred to our service for a diagnostic cerebral angiogram with carotid stenting. In anticipation to this procedure patient was loaded on aspirin and Brilinta. EXAM: Ultrasound-guided vascular access Diagnostic cerebral angiogram Carotid stenting with cerebral protection device COMPARISON:  CT/CT angiogram of the head and neck March 09, 2020. MEDICATIONS: A total of 6,000 units of heparin IV. ANESTHESIA/SEDATION: The procedure was performed under monitored anesthesia care. CONTRAST:  60 mL of Omnipaque 300 mg/mL FLUOROSCOPY TIME:  Fluoroscopy Time: 27 minutes 48 seconds (541.3 mGy). COMPLICATIONS: None immediate. TECHNIQUE: Informed written consent was obtained from the patient and his wife after a thorough discussion of the procedural risks, benefits and alternatives. All questions were addressed. Maximal Sterile Barrier Technique was utilized including caps, mask, sterile gowns, sterile gloves, sterile drape, hand hygiene and skin antiseptic. A timeout was performed prior to the initiation of the procedure. The right groin was prepped and draped in the usual sterile fashion. Using a micropuncture kit  and the modified Seldinger technique, access was gained to the right common femoral artery and an 8 French sheath was placed. Real-time ultrasound guidance was utilized for vascular access including the acquisition of a permanent ultrasound image documenting patency of the accessed vessel. Under fluoroscopy, a 5 [place] Berenstein 2 catheter and a 0.035" Terumo Glidewire were navigated into the aortic arch. The catheter was placed into the common origin of innominate and left common  carotid artery. Frontal angiograms of the neck were obtained. The catheter was then placed into the left subclavian artery. Frontal angiogram of the neck were obtained. The catheter was then advanced into the right common carotid artery. Frontal and lateral angiograms of the neck were obtained followed by frontal and lateral angiograms of the head. FINDINGS: 1. Soft plaque in the cervical right ICA just distal to the bulb resulting in approximately 75-80% stenosis. Irregularity with ulceration at the carotid bulb. 2. 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 Slow opacification of intracranial branches noted. 3. Patent right common femoral artery. PROCEDURE: The Berenstein 2 catheter was exchanged over the wire and under biplane roadmap for a 6 [place] shuttle sheath. Frontal and lateral angiograms of the neck were obtained. Using biplane roadmap a 2.5-4.8 Emboshield NAV 6 cerebral protection device was advanced into the distal cervical segment of the right ICA. Subsequently, a 6-8 x 40 mm XACT carotid stent was deployed in the cervical right ICA from the origin of the bulb to the mid cervical segment, covering the bulb ulceration and focal stenosis. Follow-up angiogram showed adequate stent deployment with resolution of stenosis and no evidence of complication. The cerebral protection device was subsequently recaptured. Delayed angiograms with frontal and lateral views of the neck showed no evidence of in stent platelet aggregation. Right common carotid artery angiograms with frontal and lateral views of the head showed significant improvement of the anterograde flow to the right anterior circulation. Right common femoral artery angiograms were obtained with right anterior oblique and lateral views. The puncture is at the level of the common femoral artery which has adequate caliber. The femoral sheath was exchanged for a Perclose ProGlide which was utilized  for access closure. Immediate hemostasis was achieved. IMPRESSION: Successful and uncomplicated cervical right internal carotid artery stenting with cerebral protection device for treatment of symptomatic stenosis. PLAN: Continue on dual anti-platelet therapy with Brilinta and aspirin. Follow-up carotid duplex at three-month postprocedure. Electronically Signed   By: Pedro Earls M.D.   On: 03/10/2020 18:43   ECHOCARDIOGRAM COMPLETE  Result Date: 03/10/2020    ECHOCARDIOGRAM REPORT   Patient Name:   Michael Wells Date of Exam: 03/10/2020 Medical Rec #:  [id number]      Height:       67.0 in Accession #:    [id number]     Weight:       170.0 lb Date of Birth:  1940-12-19      BSA:          1.887 m Patient Age:    74 years       BP:           109/83 mmHg Patient Gender: M              HR:           93 bpm. Exam Location:  Inpatient Procedure: 2D Echo, Cardiac Doppler and Color Doppler Indications:    Stroke I63.9  History:  Patient has no prior history of Echocardiogram examinations.  Sonographer:    Merrie Roof Referring Phys: [id number] [place] T TU IMPRESSIONS  1. Left ventricular ejection fraction, by estimation, is 60 to 65%. The left ventricle has normal function. The left ventricle has no regional wall motion abnormalities. Left ventricular diastolic parameters are consistent with Grade I diastolic dysfunction (impaired relaxation).  2. Right ventricular systolic function is normal. The right ventricular size is normal.  3. The mitral valve is normal in structure. No evidence of mitral valve regurgitation. No evidence of mitral stenosis.  4. The aortic valve is normal in structure. Aortic valve regurgitation is not visualized. No aortic stenosis is present.  5. The inferior vena cava is normal in size with greater than 50% respiratory variability, suggesting right atrial pressure of 3 mmHg. Conclusion(s)/Recommendation(s): No intracardiac source of embolism detected on this transthoracic  study. A transesophageal echocardiogram is recommended to exclude cardiac source of embolism if clinically indicated. FINDINGS  Left Ventricle: Left ventricular ejection fraction, by estimation, is 60 to 65%. The left ventricle has normal function. The left ventricle has no regional wall motion abnormalities. The left ventricular internal cavity size was normal in size. There is  no left ventricular hypertrophy. Left ventricular diastolic parameters are consistent with Grade I diastolic dysfunction (impaired relaxation). Right Ventricle: The right ventricular size is normal. No increase in right ventricular wall thickness. Right ventricular systolic function is normal. Left Atrium: Left atrial size was normal in size. Right Atrium: Right atrial size was normal in size. Pericardium: There is no evidence of pericardial effusion. Mitral Valve: The mitral valve is normal in structure. No evidence of mitral valve regurgitation. No evidence of mitral valve stenosis. Tricuspid Valve: The tricuspid valve is normal in structure. Tricuspid valve regurgitation is not demonstrated. No evidence of tricuspid stenosis. Aortic Valve: The aortic valve is normal in structure. Aortic valve regurgitation is not visualized. No aortic stenosis is present. Aortic valve mean gradient measures 4.0 mmHg. Aortic valve peak gradient measures 6.9 mmHg. Aortic valve area, by VTI measures 2.38 cm. Pulmonic Valve: The pulmonic valve was normal in structure. Pulmonic valve regurgitation is not visualized. No evidence of pulmonic stenosis. Aorta: The aortic root is normal in size and structure. Venous: The inferior vena cava is normal in size with greater than 50% respiratory variability, suggesting right atrial pressure of 3 mmHg. IAS/Shunts: No atrial level shunt detected by color flow Doppler.  LEFT VENTRICLE PLAX 2D LVIDd:         4.60 cm LVIDs:         3.00 cm LV PW:         1.00 cm LV IVS:        1.00 cm LVOT diam:     2.00 cm LV SV:          49 LV SV Index:   26 LVOT Area:     3.14 cm  LV Volumes (MOD) LV vol d, MOD A4C: 54.0 ml LV SV MOD A4C:     54.0 ml LEFT ATRIUM           Index LA diam:      3.60 cm 1.91 cm/m LA Vol (A2C): 46.2 ml 24.48 ml/m LA Vol (A4C): 49.4 ml 26.18 ml/m  AORTIC VALVE AV Area (Vmax):    2.34 cm AV Area (Vmean):   2.26 cm AV Area (VTI):     2.38 cm AV Vmax:           131.00 cm/s  AV Vmean:          91.200 cm/s AV VTI:            0.205 m AV Peak Grad:      6.9 mmHg AV Mean Grad:      4.0 mmHg LVOT Vmax:         97.50 cm/s LVOT Vmean:        65.600 cm/s LVOT VTI:          0.155 m LVOT/AV VTI ratio: 0.76  AORTA Ao Root diam: 3.40 cm Ao Asc diam:  3.40 cm  SHUNTS Systemic VTI:  0.16 m Systemic Diam: 2.00 cm Candee Furbish MD Electronically signed by Candee Furbish MD Signature Date/Time: 03/10/2020/3:27:45 PM    Final    IR ANGIO VERTEBRAL SEL SUBCLAVIAN INNOMINATE UNI L MOD SED  Result Date: 03/10/2020 INDICATION: 80 year old male with past medical history significant for chronic pain. He presented with right-sided vision loss. 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 He was transferred to our service for a diagnostic cerebral angiogram with carotid stenting. In anticipation to this procedure patient was loaded on aspirin and Brilinta. EXAM: Ultrasound-guided vascular access Diagnostic cerebral angiogram Carotid stenting with cerebral protection device COMPARISON:  CT/CT angiogram of the head and neck March 09, 2020. MEDICATIONS: A total of 6,000 units of heparin IV. ANESTHESIA/SEDATION: The procedure was performed under monitored anesthesia care. CONTRAST:  60 mL of Omnipaque 300 mg/mL FLUOROSCOPY TIME:  Fluoroscopy Time: 27 minutes 48 seconds (541.3 mGy). COMPLICATIONS: None immediate. TECHNIQUE: Informed written consent was obtained from the patient and his wife after a thorough discussion of the procedural risks,  benefits and alternatives. All questions were addressed. Maximal Sterile Barrier Technique was utilized including caps, mask, sterile gowns, sterile gloves, sterile drape, hand hygiene and skin antiseptic. A timeout was performed prior to the initiation of the procedure. The right groin was prepped and draped in the usual sterile fashion. Using a micropuncture kit and the modified Seldinger technique, access was gained to the right common femoral artery and an 8 French sheath was placed. Real-time ultrasound guidance was utilized for vascular access including the acquisition of a permanent ultrasound image documenting patency of the accessed vessel. Under fluoroscopy, a 5 [place] Berenstein 2 catheter and a 0.035" Terumo Glidewire were navigated into the aortic arch. The catheter was placed into the common origin of innominate and left common carotid artery. Frontal angiograms of the neck were obtained. The catheter was then placed into the left subclavian artery. Frontal angiogram of the neck were obtained. The catheter was then advanced into the right common carotid artery. Frontal and lateral angiograms of the neck were obtained followed by frontal and lateral angiograms of the head. FINDINGS: 1. Soft plaque in the cervical right ICA just distal to the bulb resulting in approximately 75-80% stenosis. Irregularity with ulceration at the carotid bulb. 2. 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 Slow opacification of intracranial branches noted. 3. Patent right common femoral artery. PROCEDURE: The Berenstein 2 catheter was exchanged over the wire and under biplane roadmap for a 6 [place] shuttle sheath. Frontal and lateral angiograms of the neck were obtained. Using biplane roadmap a 2.5-4.8 Emboshield NAV 6 cerebral protection device was advanced into the distal cervical segment of the right ICA. Subsequently, a 6-8 x 40 mm XACT carotid stent was deployed in  the  cervical right ICA from the origin of the bulb to the mid cervical segment, covering the bulb ulceration and focal stenosis. Follow-up angiogram showed adequate stent deployment with resolution of stenosis and no evidence of complication. The cerebral protection device was subsequently recaptured. Delayed angiograms with frontal and lateral views of the neck showed no evidence of in stent platelet aggregation. Right common carotid artery angiograms with frontal and lateral views of the head showed significant improvement of the anterograde flow to the right anterior circulation. Right common femoral artery angiograms were obtained with right anterior oblique and lateral views. The puncture is at the level of the common femoral artery which has adequate caliber. The femoral sheath was exchanged for a Perclose ProGlide which was utilized for access closure. Immediate hemostasis was achieved. IMPRESSION: Successful and uncomplicated cervical right internal carotid artery stenting with cerebral protection device for treatment of symptomatic stenosis. PLAN: Continue on dual anti-platelet therapy with Brilinta and aspirin. Follow-up carotid duplex at three-month postprocedure. Electronically Signed   By: Pedro Earls M.D.   On: 03/10/2020 18:43    PHYSICAL EXAM  Temp:  [97.2 F (36.2 C)-98.7 F (37.1 C)] 97.8 F (36.6 C) (02/26 1100) Pulse Rate:  [61-107] 63 (02/26 1200) Resp:  [0-21] 17 (02/26 1200) BP: (48-143)/(24-97) 136/66 (02/26 1200) SpO2:  [90 %-100 %] 96 % (02/26 1200) Arterial Line BP: (72-168)/(38-78) 146/66 (02/26 0700) Weight:  [70 kg] 70 kg (02/25 2136)  Exam unchanged compared to yesterday General - Well nourished, well developed, in no apparent distress. Ophthalmologic - fundi not visualized due to noncooperation. Cardiovascular - Regular rhythm and rate. Mental Status -  Level of arousal and orientation to time, place, and person were intact. Language including  expression, naming, repetition, comprehension was assessed and found intact. Fund of Knowledge was assessed and was intact.  Cranial Nerves II - XII - II - Visual field intact OS.  Limited vision OD, barely see hand waving. III, IV, VI - Extraocular movements intact. V - Facial sensation intact bilaterally. VII - Facial movement intact bilaterally. VIII - Hearing & vestibular intact bilaterally. X - Palate elevates symmetrically. XI - Chin turning & shoulder shrug intact bilaterally. XII - Tongue protrusion intact.  Motor Strength - The patient's strength was normal in all extremities and pronator drift was absent.  Bulk was normal and fasciculations were absent.   Motor Tone - Muscle tone was assessed at the neck and appendages and was normal. Reflexes - The patient's reflexes were symmetrical in all extremities and he had no pathological reflexes. Sensory - Light touch, temperature/pinprick were assessed and were symmetrical.   Coordination - The patient had normal movements in the hands and feet with no ataxia or dysmetria.  Tremor was absent. Gait and Station - deferred.  ASSESSMENT/PLAN Michael Wells is a 80 y.o. male with history of chronic pain presenting with right eye vision loss s/p right cervical ICA stenting.    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 Few other punctate foci of acute infarction at the right frontoparietal junction vertex  CTA head and neck with severe narrowing of the right mid carotid artery, 75 to 80% stenosis. Left ICA less severely affected with approximately 50% stenosis. Additional atherosclerotic involvement of both vertebral arteries, left greater than right, as well as significant intracranial atherosclerosis  2D Echo EF 60-65%,  no wall motion abnormalities, no PFO.  LDL 95  HgbA1c  5.6  VTE prophylaxis -Lovenox  No antithrombotic prior to admission, continue aspirin 81 mg daily and Brilinta (ticagrelor) 90 mg bid.   Therapy recommendations: Pending  Disposition: Requesting to leave now.  Carotid stenosis  CTA head and neck with severe narrowing of the right mid carotid artery, 75 to 80% stenosis. Left ICA less severely affected with approximately 50% stenosis.   Right ICA lesion too high for CEA  Dr. Norma Fredrickson interventional neuroradiology s/p right cervical ICA stenting.  On aspirin and graded at DAPT.  Hypertension  Home meds: None  Stable . Long-term BP goal per interventional neuroradiology  Hyperlipidemia  Home meds: None   LDL 95, goal < 70  Continue Lipitor 40  Other Stroke Risk Factors  Advanced Age >/= 58   ETOH use, advised to drink no more than 2 drink(s) a day  Other Active Problems    Hospital day # 1   Lynnae Sandhoff, MD Page: [phone] 03/11/2020 1:05 PM    To contact Stroke Continuity provider, please refer to [email or web address] After hours, contact General Neurology

## 2020-03-11 NOTE — Evaluation (Signed)
Speech Language Pathology Evaluation Patient Details Name: Michael Wells MRN: [id number] DOB: 12-26-1940 Today's Date: 03/11/2020 Time: 1245-1300 SLP Time Calculation (min) (ACUTE ONLY): 15 min  Problem List:  Patient Active Problem List   Diagnosis Date Noted  . Acute ischemic stroke (HCC) 03/10/2020  . 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 Chronic pain 03/09/2020   Past Medical History:  Past Medical History:  Diagnosis Date  . Anxiety   . Chronic pain   . H/O sleep disturbance    Past Surgical History:  Past Surgical History:  Procedure Laterality Date  . IR ANGIO VERTEBRAL SEL SUBCLAVIAN INNOMINATE UNI L MOD SED  03/10/2020  . IR INTRAVSC STENT CERV CAROTID W/EMB-PROT MOD SED INCL ANGIO  03/10/2020  . IR STENT PLACEMENT ANTE CAROTID INC ANGIO  03/10/2020      . IR US GUIDE VASC ACCESS RIGHT  03/10/2020   HPI:  80 y.o. 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   Assessment / Plan / Recommendation Clinical Impression  Patient presents with a moderate cognitive impairment and a mild dysarthria (spouse reports speech/voice seem at baseline).Patient was intelligible at conversational level but exhibited some reduced lingual ROM and articulatory accuracy resulting in glottal like sound during speech production. 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 Patient's spouse has observed that patient's memory has declined. Memory impairment appears largely due to storage deficit as he did not even recall that SLP had asked him to remember 5 words even after context cues. Verbal problem solving was also impaired, and although he was able to calculate in head to add two  amounts ($3 + $20), when asked how much he would have left if he had $100 to start, he reported, "91 or 92 dollars".  He did not demonstrate errors when drawing clock, with patient neglecting left side of clock face and fitting all numbers on right side. (Of note, OT is recommending full visual field assessment after discharge). As patient is to be discharging home with family providing 24 hour supervision and OT and PT recommending outpatient therapies, SLP is recommending OP SLP to evaluate.    SLP Assessment  SLP Recommendation/Assessment: All further Speech Lanaguage Pathology  needs can be addressed in the next venue of care SLP Visit Diagnosis: Cognitive communication deficit (R41.841);Dysarthria and anarthria (R47.1)    Follow Up Recommendations  Outpatient SLP;24 hour supervision/assistance    Frequency and Duration           SLP Evaluation Cognition  Overall Cognitive Status: Impaired/Different from baseline Arousal/Alertness: Awake/alert Orientation Level: Oriented to person;Oriented to place;Oriented to time Memory: Impaired Memory Impairment: Storage deficit;Other (comment) (did not even recall that SLP had presented 5 words to recall) Awareness: Impaired Awareness Impairment: Emergent impairment Problem Solving: Impaired Problem Solving Impairment: Verbal complex Executive Function: Self Monitoring Self Monitoring: Impaired Self Monitoring Impairment: Functional basic Safety/Judgment: Impaired       Comprehension  Auditory Comprehension Overall Auditory Comprehension: Appears within functional limits for tasks assessed    Expression Expression Primary Mode of Expression: Verbal Verbal Expression Overall Verbal Expression: Appears within functional limits for tasks assessed   Oral / Motor  Motor Speech Overall Motor Speech: Impaired (patient able to be understood without  significant difficulty but did exhibit some reduced lingual ROM during speech, resulting in  some glottal sounds during speech production. Wife reports that is baseline) Respiration: Within functional limits Phonation: Normal Resonance: Within functional limits Articulation: Impaired Level of Impairment: Conversation Intelligibility: Intelligibility reduced Word: 75-100% accurate Phrase: 75-100% accurate Sentence: 75-100% accurate Conversation: 75-100% accurate Motor Planning: Witnin functional limits Motor Speech Errors: Not applicable   GO                    Angela Nevin, MA, CCC-SLP Speech Therapy [facility]

## 2020-03-11 NOTE — Discharge Instructions (Signed)
Femoral Site Care This sheet gives you information about how to care for yourself after your procedure. Your health care provider may also give you more specific instructions. If you have problems or questions, contact your health care provider. What can I expect after the procedure? After the procedure, it is common to have:  Bruising that usually fades within 1-2 weeks.  Tenderness at the site. Follow these instructions at home: Wound care 1. Follow instructions from your health care provider about how to take care of your insertion site. Make sure you: ? Wash your hands with soap and water before you change your bandage (dressing). If soap and water are not available, use hand sanitizer. ? Change your dressing as directed- pressure dressing removed 24 hours post-procedure (and switch for bandaid), bandaid removed 72 hours post-procedure 2. Do not take baths, swim, or use a hot tub for 7 days post-procedure. 3. You may shower 48 hours after the procedure or as told by your health care provider. ? Gently wash the site with plain soap and water. ? Pat the area dry with a clean towel. ? Do not rub the site. This may cause bleeding. 4. Check your site every day for signs of infection. Check for: ? Redness, swelling, or pain. ? Fluid or blood. ? Warmth. ? Pus or a bad smell. Activity  Do not stoop, bend, or lift anything that is heavier than 10 lb (4.5 kg) for 1 week post-procedure.  Do not drive self for 1 week post-procedure. Contact a health care provider if you have:  A fever or chills.  You have redness, swelling, or pain around your insertion site. Get help right away if:  The catheter insertion area swells very fast.  You pass out.  You suddenly start to sweat or your skin gets clammy.  The catheter insertion area is bleeding, and the bleeding does not stop when you hold steady pressure on the area.  The area near or just beyond the catheter insertion site becomes pale,  cool, tingly, or numb. These symptoms may represent a serious problem that is an emergency. Do not wait to see if the symptoms will go away. Get medical help right away. Call your local emergency services (911 in the [place] Do not drive yourself to the hospital.  This information is not intended to replace advice given to you by your health care provider. Make sure you discuss any questions you have with your health care provider. Document Revised: 01/13/2017 Document Reviewed: 01/13/2017 Elsevier Patient Education  2020 [employer].

## 2020-03-11 NOTE — Progress Notes (Signed)
Pt found without a- line and one IV. Pressure manually held over sites and dressed with gauze once bleeding stopped. Per pt "I have some place to be and don't need this stuff anymore." Pt appears to be more confused than prior assessments and wax/wanes between AOx 2-3. Day RN given report and made aware of pt's status.

## 2020-03-11 NOTE — Discharge Summary (Signed)
Physician Discharge Summary  Michael Wells XLK:[id number] DOB: [birth year] DOA: 79/24/2022  PCP: Everardo Beals, NP  Admit date: 03/09/2020 Discharge date: 03/11/2020  Admitted From: Home Disposition: Home  Recommendations for Outpatient Follow-up:  1. Follow up with PCP in 1-2 weeks 2. Please obtain BMP/CBC in one week 3. 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 For the past several weeks patient has been having waxing and waning right eye blurred vision with "aches". He was evaluated by the ophthalmologist today with concerns for potential right optic nerve edema and was told to present to the ED. In ED, MRI orbit and brain was obtained showing punctate acute infarct in the right parieto-occipital white matter. Findings consistent with microembolic insults from either the right carotid, heart or ascending aorta.  Neurology consulted and hospitalist called for admission.  Patient admitted for acute right eye visual changes found to have acute infarct on MRI with severe carotid stenosis status post right carotid stenting with interventional neuroradiology.  Patient had small hematoma in the groin overnight but stabilizing no issues with further bleeding, pain is well controlled and vision is already improving although not yet back to baseline.  Patient only close follow-up with interventional radiology in 3 months for repeat imaging, will be discharged on aspirin and Brilinta per recommendations, follow-up with neurology and PCP as scheduled.  Discharge Diagnoses:  Active Problems:   Embolic stroke (HCC)   Chronic pain   Acute  ischemic stroke [facility])    Discharge Instructions  Discharge Instructions    Ambulatory referral to Occupational Therapy   Complete by: As directed    Ambulatory referral to Physical Therapy   Complete by: As directed    Call MD for:   Complete by: As directed    Return to the ED immediately if worsening swelling bleeding or pain at the right groin site   Call MD for:  redness, tenderness, or signs of infection (pain, swelling, redness, odor or green/yellow discharge around incision site)   Complete by: As directed    Call MD for:  severe uncontrolled pain   Complete by: As directed    Diet - low sodium heart healthy   Complete by: As directed    Increase activity slowly   Complete by: As directed    Remove dressing in 24 hours   Complete by: As directed      Allergies as of 03/11/2020   No Known Allergies     Medication List    STOP taking these medications   GOODY HEADACHE PO   SINUS PO     TAKE these medications   aspirin 81 MG EC tablet Take 1 tablet (81 mg total) by mouth daily. Swallow whole.   atorvastatin 40 MG tablet Commonly known as: LIPITOR Take 1 tablet (40 mg total) by mouth daily.   HYDROcodone-acetaminophen 5-325 MG tablet Commonly known as: NORCO/VICODIN Take 1 tablet by mouth every 6 (six) hours as needed for moderate pain.   ticagrelor 90 MG Tabs tablet Commonly known as: BRILINTA Take 1 tablet (90 mg total) by mouth 2 (two) times daily.       Follow-up Information    de Sindy Messing, Erven Colla, MD Follow up in 3 month(s).  Specialties: Radiology, Interventional Radiology Why: Please follow-up with Dr. Karenann Cai for carotid US followed by consult 3 months after discharge. Our office will call you to set up this appointment. Contact information: [place] [address] [phone]        [facility] Follow up.   Specialty: Rehabilitation Why: Outpatient physical and  occupational therapy; rehab center will call you for an appointment, or you may call to schedule.  Contact information: [address] [id number] [place] [phone]             No Known Allergies  Consultations:  Neurology, interventional neuroradiology   Procedures/Studies: CT Angio Head W or Wo Contrast  Result Date: 03/09/2020 CLINICAL DATA:  Right optic nerve edema. Abnormal vision right eye. Small embolic infarctions shown in the right cerebral hemisphere by MRI. EXAM: CT ANGIOGRAPHY HEAD AND NECK TECHNIQUE: Multidetector CT imaging of the head and neck was performed using the standard protocol during bolus administration of intravenous contrast. Multiplanar CT image reconstructions and MIPs were obtained to evaluate the vascular anatomy. Carotid stenosis measurements (when applicable) are obtained utilizing NASCET criteria, using the distal internal carotid diameter as the denominator. CONTRAST:  73m OMNIPAQUE IOHEXOL 350 MG/ML SOLN COMPARISON:  MRI same day FINDINGS: CT HEAD FINDINGS Brain: Age related atrophy. Chronic small-vessel ischemic changes seen affecting the pons and cerebral hemispheric white matter. No sign of acute infarction by CT. No mass lesion, hemorrhage, hydrocephalus or extra-axial collection. Vascular: There is atherosclerotic calcification of the major vessels at the base of the brain. Skull: Negative Sinuses: Clear Orbits: Negative Review of the MIP images confirms the above findings CTA NECK FINDINGS Aortic arch: Aortic atherosclerosis. Vessel origins from the arch are not entirely included. Right carotid system: Common carotid artery widely patent to the bifurcation. Soft and calcified plaque in the ICA bulb. Severe stenosis at the distal bulb with diameter of only 1 mm. Compared to a more distal cervical ICA diameter of 4.9 mm, this indicates a 75-80% stenosis. Some atherosclerotic plaque of the upper cervical ICA just  proximal to the skull base but without stenosis more than about 20%. Left carotid system: Common carotid artery widely patent to the bifurcation. Minimal soft plaque in the ICA bulb. Calcified plaque at the more distal bulb and proximal cervical ICA. Minimal diameter in that location is 2.5 mm. Compared to a more distal cervical ICA diameter of 5 mm, this indicates a 50% stenosis. Vertebral arteries: Right vertebral artery origin is patent. Atherosclerotic narrowing just beyond the origin of 50-70%. The vessel is widely patent beyond that through the cervical region to the foramen magnum. Severe stenosis at the left vertebral artery origin, 80% or greater. Beyond that, the vessel is widely patent to the foramen magnum. Skeleton: Ordinary spondylosis.  Chronic fusion C5 through C7. Other neck: No mass or lymphadenopathy. Some atrophic change of the right lobe of the thyroid. This could also be postsurgical. Upper chest: Benign appearing lipoma of the left posterior paraspinal musculature. Review of the MIP images confirms the above findings CTA HEAD FINDINGS Anterior circulation: Both internal carotid arteries are patent through the skull base and siphon regions. There is atherosclerotic calcification with stenosis estimated at 30% on both sides. Stenosis disease of the supraclinoid internal carotid arteries is more severe on the right where stenosis is estimated at 70%. The anterior and middle cerebral vessels are patent. No large or medium vessel occlusion. Posterior circulation: Both vertebral arteries  are patent through the foramen magnum. There is calcified plaque of the left vertebral artery V4 segment but without stenosis greater than 30%. Both vertebral arteries reach the basilar. No basilar stenosis. Superior cerebellar and posterior cerebral arteries are patent. Venous sinuses: Patent and normal. Anatomic variants: None significant. Review of the MIP images confirms the above findings IMPRESSION: 1. Aortic  atherosclerosis. 2. Vessel origins from the arch are not entirely included. 3. 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 Severe stenosis at the distal bulb with diameter of only 1 mm. Compared to a more distal cervical ICA diameter of 4.9 mm, this indicates a 75-80% stenosis. 4. Calcified plaque at the left ICA bulb and proximal cervical ICA. Minimal diameter in that location is 2.5 mm. Compared to a more distal cervical ICA diameter of 5 mm, this indicates a 50% stenosis. 5. Right vertebral artery origin is patent. Atherosclerotic narrowing just beyond the origin of 50-70%. Severe stenosis at the left vertebral artery origin, 80% or greater. 6. Atherosclerotic disease in both carotid siphon regions. 30% stenosis on both sides. Stenosis of the supraclinoid internal carotid arteries is more severe on the right where stenosis is estimated at 70%. 7. No intracranial large or medium vessel occlusion or correctable proximal stenosis. 8. 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 Aortic Atherosclerosis (ICD10-I70.0). Electronically Signed   By: Nelson Chimes M.D.   On: 03/09/2020 21:55   CT Angio Neck W and/or Wo Contrast  Result Date: 03/09/2020 CLINICAL DATA:  Right optic nerve edema. Abnormal vision right eye. Small embolic infarctions shown in the right cerebral hemisphere by MRI. EXAM: CT ANGIOGRAPHY HEAD AND NECK TECHNIQUE: Multidetector CT imaging of the head and neck was performed using the standard protocol during bolus administration of intravenous contrast. Multiplanar CT image reconstructions and MIPs were obtained to evaluate the vascular anatomy. Carotid stenosis measurements (when applicable) are obtained utilizing NASCET criteria, using the distal internal carotid diameter as the denominator. CONTRAST:  21m OMNIPAQUE IOHEXOL 350 MG/ML SOLN COMPARISON:  MRI same day  FINDINGS: CT HEAD FINDINGS Brain: Age related atrophy. Chronic small-vessel ischemic changes seen affecting the pons and cerebral hemispheric white matter. No sign of acute infarction by CT. No mass lesion, hemorrhage, hydrocephalus or extra-axial collection. Vascular: There is atherosclerotic calcification of the major vessels at the base of the brain. Skull: Negative Sinuses: Clear Orbits: Negative Review of the MIP images confirms the above findings CTA NECK FINDINGS Aortic arch: Aortic atherosclerosis. Vessel origins from the arch are not entirely included. Right carotid system: Common carotid artery widely patent to the bifurcation. Soft and calcified plaque in the ICA bulb. Severe stenosis at the distal bulb with diameter of only 1 mm. Compared to a more distal cervical ICA diameter of 4.9 mm, this indicates a 75-80% stenosis. Some atherosclerotic plaque of the upper cervical ICA just proximal to the skull base but without stenosis more than about 20%. Left carotid system: Common carotid artery widely patent to the bifurcation. Minimal soft plaque in the ICA bulb. Calcified plaque at the more distal bulb and proximal cervical ICA. Minimal diameter in that location is 2.5 mm. Compared to a more distal cervical ICA diameter of 5 mm, this indicates a 50% stenosis. Vertebral arteries: Right vertebral artery origin is patent. Atherosclerotic narrowing just beyond the origin of 50-70%. The vessel is widely patent beyond that through the cervical region to the foramen magnum. Severe  stenosis at the left vertebral artery origin, 80% or greater. Beyond that, the vessel is widely patent to the foramen magnum. Skeleton: Ordinary spondylosis.  Chronic fusion C5 through C7. Other neck: No mass or lymphadenopathy. Some atrophic change of the right lobe of the thyroid. This could also be postsurgical. Upper chest: Benign appearing lipoma of the left posterior paraspinal musculature. Review of the MIP images confirms the  above findings CTA HEAD FINDINGS Anterior circulation: Both internal carotid arteries are patent through the skull base and siphon regions. There is atherosclerotic calcification with stenosis estimated at 30% on both sides. Stenosis disease of the supraclinoid internal carotid arteries is more severe on the right where stenosis is estimated at 70%. The anterior and middle cerebral vessels are patent. No large or medium vessel occlusion. Posterior circulation: Both vertebral arteries are patent through the foramen magnum. There is calcified plaque of the left vertebral artery V4 segment but without stenosis greater than 30%. Both vertebral arteries reach the basilar. No basilar stenosis. Superior cerebellar and posterior cerebral arteries are patent. Venous sinuses: Patent and normal. Anatomic variants: None significant. Review of the MIP images confirms the above findings IMPRESSION: 1. Aortic atherosclerosis. 2. Vessel origins from the arch are not entirely included. 3. 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 Severe stenosis at the distal bulb with diameter of only 1 mm. Compared to a more distal cervical ICA diameter of 4.9 mm, this indicates a 75-80% stenosis. 4. Calcified plaque at the left ICA bulb and proximal cervical ICA. Minimal diameter in that location is 2.5 mm. Compared to a more distal cervical ICA diameter of 5 mm, this indicates a 50% stenosis. 5. Right vertebral artery origin is patent. Atherosclerotic narrowing just beyond the origin of 50-70%. Severe stenosis at the left vertebral artery origin, 80% or greater. 6. Atherosclerotic disease in both carotid siphon regions. 30% stenosis on both sides. Stenosis of the supraclinoid internal carotid arteries is more severe on the right where stenosis is estimated at 70%. 7. No intracranial large or medium vessel occlusion or correctable proximal stenosis. 8. In this patient with micro embolic infarctions in the right  hemisphere, the significant disease likely relates to the right-sided cervical ICA stenosis. Aortic Atherosclerosis (ICD10-I70.0). Electronically Signed   By: Nelson Chimes M.D.   On: 03/09/2020 21:55   MR Brain W and Wo Contrast  Result Date: 03/09/2020 CLINICAL DATA:  Monocular right sided vision loss.  Papilledema. EXAM: MRI HEAD AND ORBITS WITHOUT AND WITH CONTRAST TECHNIQUE: Multiplanar, multiecho pulse sequences of the brain and surrounding structures were obtained without and with intravenous contrast. Multiplanar, multiecho pulse sequences of the orbits and surrounding structures were obtained including fat saturation techniques, before and after intravenous contrast administration. CONTRAST:  77m GADAVIST GADOBUTROL 1 MMOL/ML IV SOLN COMPARISON:  None. FINDINGS: MRI HEAD FINDINGS Brain: The study suffers from motion degradation despite the technologist's best efforts. Diffusion imaging shows punctate acute infarction in the right parieto-occipital subcortical white matter. Few other punctate foci of acute infarction at the right frontoparietal junction vertex. No large confluent infarction. Findings are consistent with micro embolic insults. Elsewhere, there chronic small-vessel ischemic changes of the pons. Few old small vessel cerebellar infarctions. Cerebral hemispheres show pronounced chronic small-vessel ischemic changes throughout the deep and subcortical white matter. No mass, hemorrhage, hydrocephalus or extra-axial collection. After contrast administration, no abnormal brain enhancement occurs. Vascular: Major vessels at the base of the brain show flow. Skull and upper cervical spine: Negative Other: None MRI ORBITS  FINDINGS This study also suffers from motion degradation. Orbits: Both globes appear normal. Both optic nerves appear normal. Orbital fat is normal. Extra-ocular muscles are normal. Visualized sinuses: Clear except for minimal left anterior ethmoid fluid. Soft tissues: Other regional  soft tissues appear normal. IMPRESSION: 1. Motion degraded exam. 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 Few other punctate foci of acute infarction at the right frontoparietal junction vertex. Findings consistent with micro embolic insults from the right carotid, heart or ascending aorta. No swelling or hemorrhage. Certainly there could be micro embolic disease to the right orbit not visible by MRI. 2. 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 3. The orbital study itself is normal. Electronically Signed   By: Nelson Chimes M.D.   On: 03/09/2020 20:58   IR INTRAVSC STENT CERV CAROTID W/EMB-PROT MOD SED  Result Date: 03/10/2020 INDICATION: 80 year old male with past medical history significant for chronic pain. He presented with right-sided vision loss. 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 He was transferred to our service for a diagnostic cerebral angiogram with carotid stenting. In anticipation to this procedure patient was loaded on aspirin and Brilinta. EXAM: Ultrasound-guided vascular access Diagnostic cerebral angiogram Carotid stenting with cerebral protection device COMPARISON:  CT/CT angiogram of the head and neck March 09, 2020. MEDICATIONS: A total of 6,000 units of heparin IV. ANESTHESIA/SEDATION: The procedure was performed under monitored anesthesia care. CONTRAST:  60 mL of Omnipaque 300 mg/mL FLUOROSCOPY TIME:  Fluoroscopy Time: 27 minutes 48 seconds (541.3 mGy). COMPLICATIONS: None immediate. TECHNIQUE: Informed written consent was obtained from the patient and his wife after a thorough discussion of the procedural risks, benefits and alternatives. All questions were addressed. Maximal Sterile Barrier Technique was utilized including caps, mask, sterile gowns, sterile gloves, sterile  drape, hand hygiene and skin antiseptic. A timeout was performed prior to the initiation of the procedure. The right groin was prepped and draped in the usual sterile fashion. Using a micropuncture kit and the modified Seldinger technique, access was gained to the right common femoral artery and an 8 French sheath was placed. Real-time ultrasound guidance was utilized for vascular access including the acquisition of a permanent ultrasound image documenting patency of the accessed vessel. Under fluoroscopy, a 5 [place] Berenstein 2 catheter and a 0.035" Terumo Glidewire were navigated into the aortic arch. The catheter was placed into the common origin of innominate and left common carotid artery. Frontal angiograms of the neck were obtained. The catheter was then placed into the left subclavian artery. Frontal angiogram of the neck were obtained. The catheter was then advanced into the right common carotid artery. Frontal and lateral angiograms of the neck were obtained followed by frontal and lateral angiograms of the head. FINDINGS: 1. Soft plaque in the cervical right ICA just distal to the bulb resulting in approximately 75-80% stenosis. Irregularity with ulceration at the carotid bulb. 2. 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 Slow opacification of intracranial branches noted. 3. Patent right common femoral artery. PROCEDURE: The Berenstein 2 catheter was exchanged over the wire and under biplane roadmap for a 6 [place] shuttle sheath. Frontal and lateral angiograms of the neck were obtained. Using biplane roadmap a 2.5-4.8 Emboshield NAV 6 cerebral protection device was advanced into the distal cervical segment of the right ICA. Subsequently, a 6-8 x 40  mm XACT carotid stent was deployed in the cervical right ICA from the origin of the bulb to the mid cervical segment, covering the bulb ulceration and focal stenosis. Follow-up angiogram showed adequate  stent deployment with resolution of stenosis and no evidence of complication. The cerebral protection device was subsequently recaptured. Delayed angiograms with frontal and lateral views of the neck showed no evidence of in stent platelet aggregation. Right common carotid artery angiograms with frontal and lateral views of the head showed significant improvement of the anterograde flow to the right anterior circulation. Right common femoral artery angiograms were obtained with right anterior oblique and lateral views. The puncture is at the level of the common femoral artery which has adequate caliber. The femoral sheath was exchanged for a Perclose ProGlide which was utilized for access closure. Immediate hemostasis was achieved. IMPRESSION: Successful and uncomplicated cervical right internal carotid artery stenting with cerebral protection device for treatment of symptomatic stenosis. PLAN: Continue on dual anti-platelet therapy with Brilinta and aspirin. Follow-up carotid duplex at three-month postprocedure. Electronically Signed   By: Pedro Earls M.D.   On: 03/10/2020 18:43   IR US Guide Vasc Access Right  Result Date: 03/10/2020 INDICATION: 80 year old male with past medical history significant for chronic pain. He presented with right-sided vision loss. 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 He was transferred to our service for a diagnostic cerebral angiogram with carotid stenting. In anticipation to this procedure patient was loaded on aspirin and Brilinta. EXAM: Ultrasound-guided vascular access Diagnostic cerebral angiogram Carotid stenting with cerebral protection device COMPARISON:  CT/CT angiogram of the head and neck March 09, 2020. MEDICATIONS: A total of 6,000 units of heparin IV. ANESTHESIA/SEDATION: The procedure was performed under monitored anesthesia care.  CONTRAST:  60 mL of Omnipaque 300 mg/mL FLUOROSCOPY TIME:  Fluoroscopy Time: 27 minutes 48 seconds (541.3 mGy). COMPLICATIONS: None immediate. TECHNIQUE: Informed written consent was obtained from the patient and his wife after a thorough discussion of the procedural risks, benefits and alternatives. All questions were addressed. Maximal Sterile Barrier Technique was utilized including caps, mask, sterile gowns, sterile gloves, sterile drape, hand hygiene and skin antiseptic. A timeout was performed prior to the initiation of the procedure. The right groin was prepped and draped in the usual sterile fashion. Using a micropuncture kit and the modified Seldinger technique, access was gained to the right common femoral artery and an 8 French sheath was placed. Real-time ultrasound guidance was utilized for vascular access including the acquisition of a permanent ultrasound image documenting patency of the accessed vessel. Under fluoroscopy, a 5 [place] Berenstein 2 catheter and a 0.035" Terumo Glidewire were navigated into the aortic arch. The catheter was placed into the common origin of innominate and left common carotid artery. Frontal angiograms of the neck were obtained. The catheter was then placed into the left subclavian artery. Frontal angiogram of the neck were obtained. The catheter was then advanced into the right common carotid artery. Frontal and lateral angiograms of the neck were obtained followed by frontal and lateral angiograms of the head. FINDINGS: 1. Soft plaque in the cervical right ICA just distal to the bulb resulting in approximately 75-80% stenosis. Irregularity with ulceration at the carotid bulb. 2. 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 Slow opacification of intracranial branches noted. 3. Patent right common femoral artery. PROCEDURE: The Berenstein 2 catheter  was exchanged over the wire and under biplane roadmap for a 6 [place]  shuttle sheath. Frontal and lateral angiograms of the neck were obtained. Using biplane roadmap a 2.5-4.8 Emboshield NAV 6 cerebral protection device was advanced into the distal cervical segment of the right ICA. Subsequently, a 6-8 x 40 mm XACT carotid stent was deployed in the cervical right ICA from the origin of the bulb to the mid cervical segment, covering the bulb ulceration and focal stenosis. Follow-up angiogram showed adequate stent deployment with resolution of stenosis and no evidence of complication. The cerebral protection device was subsequently recaptured. Delayed angiograms with frontal and lateral views of the neck showed no evidence of in stent platelet aggregation. Right common carotid artery angiograms with frontal and lateral views of the head showed significant improvement of the anterograde flow to the right anterior circulation. Right common femoral artery angiograms were obtained with right anterior oblique and lateral views. The puncture is at the level of the common femoral artery which has adequate caliber. The femoral sheath was exchanged for a Perclose ProGlide which was utilized for access closure. Immediate hemostasis was achieved. IMPRESSION: Successful and uncomplicated cervical right internal carotid artery stenting with cerebral protection device for treatment of symptomatic stenosis. PLAN: Continue on dual anti-platelet therapy with Brilinta and aspirin. Follow-up carotid duplex at three-month postprocedure. Electronically Signed   By: Pedro Earls M.D.   On: 03/10/2020 18:43   ECHOCARDIOGRAM COMPLETE  Result Date: 03/10/2020    ECHOCARDIOGRAM REPORT   Patient Name:   Michael Wells Date of Exam: 03/10/2020 Medical Rec #:  [id number]      Height:       67.0 in Accession #:    [id number]     Weight:       170.0 lb Date of Birth:  1940/08/10      BSA:          1.887 m Patient Age:    17 years       BP:           109/83 mmHg Patient Gender: M              HR:            93 bpm. Exam Location:  Inpatient Procedure: 2D Echo, Cardiac Doppler and Color Doppler Indications:    Stroke I63.9  History:        Patient has no prior history of Echocardiogram examinations.  Sonographer:    Merrie Roof Referring Phys: [id number] [place] T TU IMPRESSIONS  1. Left ventricular ejection fraction, by estimation, is 60 to 65%. The left ventricle has normal function. The left ventricle has no regional wall motion abnormalities. Left ventricular diastolic parameters are consistent with Grade I diastolic dysfunction (impaired relaxation).  2. Right ventricular systolic function is normal. The right ventricular size is normal.  3. The mitral valve is normal in structure. No evidence of mitral valve regurgitation. No evidence of mitral stenosis.  4. The aortic valve is normal in structure. Aortic valve regurgitation is not visualized. No aortic stenosis is present.  5. The inferior vena cava is normal in size with greater than 50% respiratory variability, suggesting right atrial pressure of 3 mmHg. Conclusion(s)/Recommendation(s): No intracardiac source of embolism detected on this transthoracic study. A transesophageal echocardiogram is recommended to exclude cardiac source of embolism if clinically indicated. FINDINGS  Left Ventricle: Left ventricular ejection fraction, by estimation, is 60 to 65%. The left ventricle has normal function. The left  ventricle has no regional wall motion abnormalities. The left ventricular internal cavity size was normal in size. There is  no left ventricular hypertrophy. Left ventricular diastolic parameters are consistent with Grade I diastolic dysfunction (impaired relaxation). Right Ventricle: The right ventricular size is normal. No increase in right ventricular wall thickness. Right ventricular systolic function is normal. Left Atrium: Left atrial size was normal in size. Right Atrium: Right atrial size was normal in size. Pericardium: There is no evidence of  pericardial effusion. Mitral Valve: The mitral valve is normal in structure. No evidence of mitral valve regurgitation. No evidence of mitral valve stenosis. Tricuspid Valve: The tricuspid valve is normal in structure. Tricuspid valve regurgitation is not demonstrated. No evidence of tricuspid stenosis. Aortic Valve: The aortic valve is normal in structure. Aortic valve regurgitation is not visualized. No aortic stenosis is present. Aortic valve mean gradient measures 4.0 mmHg. Aortic valve peak gradient measures 6.9 mmHg. Aortic valve area, by VTI measures 2.38 cm. Pulmonic Valve: The pulmonic valve was normal in structure. Pulmonic valve regurgitation is not visualized. No evidence of pulmonic stenosis. Aorta: The aortic root is normal in size and structure. Venous: The inferior vena cava is normal in size with greater than 50% respiratory variability, suggesting right atrial pressure of 3 mmHg. IAS/Shunts: No atrial level shunt detected by color flow Doppler.  LEFT VENTRICLE PLAX 2D LVIDd:         4.60 cm LVIDs:         3.00 cm LV PW:         1.00 cm LV IVS:        1.00 cm LVOT diam:     2.00 cm LV SV:         49 LV SV Index:   26 LVOT Area:     3.14 cm  LV Volumes (MOD) LV vol d, MOD A4C: 54.0 ml LV SV MOD A4C:     54.0 ml LEFT ATRIUM           Index LA diam:      3.60 cm 1.91 cm/m LA Vol (A2C): 46.2 ml 24.48 ml/m LA Vol (A4C): 49.4 ml 26.18 ml/m  AORTIC VALVE AV Area (Vmax):    2.34 cm AV Area (Vmean):   2.26 cm AV Area (VTI):     2.38 cm AV Vmax:           131.00 cm/s AV Vmean:          91.200 cm/s AV VTI:            0.205 m AV Peak Grad:      6.9 mmHg AV Mean Grad:      4.0 mmHg LVOT Vmax:         97.50 cm/s LVOT Vmean:        65.600 cm/s LVOT VTI:          0.155 m LVOT/AV VTI ratio: 0.76  AORTA Ao Root diam: 3.40 cm Ao Asc diam:  3.40 cm  SHUNTS Systemic VTI:  0.16 m Systemic Diam: 2.00 cm Candee Furbish MD Electronically signed by Candee Furbish MD Signature Date/Time: 03/10/2020/3:27:45 PM    Final     MR ORBITS W WO CONTRAST  Result Date: 03/09/2020 CLINICAL DATA:  Monocular right sided vision loss.  Papilledema. EXAM: MRI HEAD AND ORBITS WITHOUT AND WITH CONTRAST TECHNIQUE: Multiplanar, multiecho pulse sequences of the brain and surrounding structures were obtained without and with intravenous contrast. Multiplanar, multiecho pulse sequences of the orbits and  surrounding structures were obtained including fat saturation techniques, before and after intravenous contrast administration. CONTRAST:  47m GADAVIST GADOBUTROL 1 MMOL/ML IV SOLN COMPARISON:  None. FINDINGS: MRI HEAD FINDINGS Brain: The study suffers from motion degradation despite the technologist's best efforts. Diffusion imaging shows punctate acute infarction in the right parieto-occipital subcortical white matter. Few other punctate foci of acute infarction at the right frontoparietal junction vertex. No large confluent infarction. Findings are consistent with micro embolic insults. Elsewhere, there chronic small-vessel ischemic changes of the pons. Few old small vessel cerebellar infarctions. Cerebral hemispheres show pronounced chronic small-vessel ischemic changes throughout the deep and subcortical white matter. No mass, hemorrhage, hydrocephalus or extra-axial collection. After contrast administration, no abnormal brain enhancement occurs. Vascular: Major vessels at the base of the brain show flow. Skull and upper cervical spine: Negative Other: None MRI ORBITS FINDINGS This study also suffers from motion degradation. Orbits: Both globes appear normal. Both optic nerves appear normal. Orbital fat is normal. Extra-ocular muscles are normal. Visualized sinuses: Clear except for minimal left anterior ethmoid fluid. Soft tissues: Other regional soft tissues appear normal. IMPRESSION: 1. Motion degraded exam. 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 Few other punctate foci of acute infarction at the  right frontoparietal junction vertex. Findings consistent with micro embolic insults from the right carotid, heart or ascending aorta. No swelling or hemorrhage. Certainly there could be micro embolic disease to the right orbit not visible by MRI. 2. 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 3. The orbital study itself is normal. Electronically Signed   By: MNelson ChimesM.D.   On: 03/09/2020 20:58   IR ANGIO VERTEBRAL SEL SUBCLAVIAN INNOMINATE UNI L MOD SED  Result Date: 03/10/2020 INDICATION: 80year old male with past medical history significant for chronic pain. He presented with right-sided vision loss. 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 He was transferred to our service for a diagnostic cerebral angiogram with carotid stenting. In anticipation to this procedure patient was loaded on aspirin and Brilinta. EXAM: Ultrasound-guided vascular access Diagnostic cerebral angiogram Carotid stenting with cerebral protection device COMPARISON:  CT/CT angiogram of the head and neck March 09, 2020. MEDICATIONS: A total of 6,000 units of heparin IV. ANESTHESIA/SEDATION: The procedure was performed under monitored anesthesia care. CONTRAST:  60 mL of Omnipaque 300 mg/mL FLUOROSCOPY TIME:  Fluoroscopy Time: 27 minutes 48 seconds (541.3 mGy). COMPLICATIONS: None immediate. TECHNIQUE: Informed written consent was obtained from the patient and his wife after a thorough discussion of the procedural risks, benefits and alternatives. All questions were addressed. Maximal Sterile Barrier Technique was utilized including caps, mask, sterile gowns, sterile gloves, sterile drape, hand hygiene and skin antiseptic. A timeout was performed prior to the initiation of the procedure. The right groin was prepped and draped in the usual sterile fashion. Using a  micropuncture kit and the modified Seldinger technique, access was gained to the right common femoral artery and an 8 French sheath was placed. Real-time ultrasound guidance was utilized for vascular access including the acquisition of a permanent ultrasound image documenting patency of the accessed vessel. Under fluoroscopy, a 5 [place] 2 catheter and a 0.035" Terumo Glidewire were navigated into the aortic arch. The catheter was placed into the common origin of innominate and left common carotid artery. Frontal angiograms of the neck were obtained. The catheter was then placed into  the left subclavian artery. Frontal angiogram of the neck were obtained. The catheter was then advanced into the right common carotid artery. Frontal and lateral angiograms of the neck were obtained followed by frontal and lateral angiograms of the head. FINDINGS: 1. Soft plaque in the cervical right ICA just distal to the bulb resulting in approximately 75-80% stenosis. Irregularity with ulceration at the carotid bulb. 2. 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 Slow opacification of intracranial branches noted. 3. Patent right common femoral artery. PROCEDURE: The Berenstein 2 catheter was exchanged over the wire and under biplane roadmap for a 6 [place] shuttle sheath. Frontal and lateral angiograms of the neck were obtained. Using biplane roadmap a 2.5-4.8 Emboshield NAV 6 cerebral protection device was advanced into the distal cervical segment of the right ICA. Subsequently, a 6-8 x 40 mm XACT carotid stent was deployed in the cervical right ICA from the origin of the bulb to the mid cervical segment, covering the bulb ulceration and focal stenosis. Follow-up angiogram showed adequate stent deployment with resolution of stenosis and no evidence of complication. The cerebral protection device was subsequently recaptured. Delayed angiograms with frontal and lateral  views of the neck showed no evidence of in stent platelet aggregation. Right common carotid artery angiograms with frontal and lateral views of the head showed significant improvement of the anterograde flow to the right anterior circulation. Right common femoral artery angiograms were obtained with right anterior oblique and lateral views. The puncture is at the level of the common femoral artery which has adequate caliber. The femoral sheath was exchanged for a Perclose ProGlide which was utilized for access closure. Immediate hemostasis was achieved. IMPRESSION: Successful and uncomplicated cervical right internal carotid artery stenting with cerebral protection device for treatment of symptomatic stenosis. PLAN: Continue on dual anti-platelet therapy with Brilinta and aspirin. Follow-up carotid duplex at three-month postprocedure. Electronically Signed   By: Pedro Earls M.D.   On: 03/10/2020 18:43      Subjective: No acute issues this morning, overnight patient was transferred to the ICU for concern over worsening groin hematoma and transient episode of hypotension.  Patient denies nausea vomiting diarrhea constipation headache fevers chills chest pain shortness of breath.  He does complain of minimal right groin pain with range of motion.   Discharge Exam: Vitals:   03/11/20 1100 03/11/20 1200  BP: 137/70 136/66  Pulse: 61 63  Resp: 16 17  Temp: 97.8 F (36.6 C)   SpO2:  96%   Vitals:   03/11/20 0900 03/11/20 1000 03/11/20 1100 03/11/20 1200  BP: 126/87 139/73 137/70 136/66  Pulse: 74 68 61 63  Resp: _0 Temp:   97.8 F (36.6 C)   TempSrc:   Oral   SpO2: 95% 96%  96%  Weight:      Height:        General: Pt is alert, awake, not in acute distress Cardiovascular: RRR, S1/S2 +, no rubs, no gallops Respiratory: CTA bilaterally, no wheezing, no rhonchi Abdomin/groin: Soft, NT, ND, bowel sounds +; resolving ecchymosis from previous outline, bandage clean  dry intact without any overt signs of bleeding, minimal pain to deep palpation. Extremities: no edema, no cyanosis    The results of significant diagnostics from this hospitalization (including imaging, microbiology, ancillary and laboratory) are listed below for reference.     Microbiology: Recent Results (from the past 240 hour(s))  SARS CORONAVIRUS 2 (TAT 6-24 HRS) Nasopharyngeal Nasopharyngeal Swab  Status: None   Collection Time: 03/09/20  9:15 PM   Specimen: Nasopharyngeal Swab  Result Value Ref Range Status   SARS Coronavirus 2 NEGATIVE NEGATIVE Final    Comment: (NOTE) SARS-CoV-2 target nucleic acids are NOT DETECTED.  The SARS-CoV-2 RNA is generally detectable in upper and lower respiratory specimens during the acute phase of infection. Negative results do not preclude SARS-CoV-2 infection, do not rule out co-infections with other pathogens, and should not be used as the sole basis for treatment or other patient management decisions. Negative results must be combined with clinical observations, patient history, and epidemiological information. The expected result is Negative.  Fact Sheet for Patients: SugarRoll.be  Fact Sheet for Healthcare Providers: https://www.woods-mathews.com/  This test is not yet approved or cleared by the [place] FDA and  has been authorized for detection and/or diagnosis of SARS-CoV-2 by FDA under an Emergency Use Authorization (EUA). This EUA will remain  in effect (meaning this test can be used) for the duration of the COVID-19 declaration under Se ction 564(b)(1) of the Act, 21 U.S.C. section 360bbb-3(b)(1), unless the authorization is terminated or revoked sooner.  Performed at [facility], [place] [address] [place], [place] [address]      Labs: BNP (last 3 results) No results for input(s): BNP in the last 8760 hours. Basic Metabolic Panel: Recent Labs  Lab 03/09/20 1600  03/11/20 0127 03/11/20 0337  NA 138 135  --   K 3.9 3.5  --   CL 107 108  --   CO2 20* 18*  --   GLUCOSE 95 121*  --   BUN 11 10  --   CREATININE 1.03 0.87  --   CALCIUM 10.7* 10.3  --   MG  --   --  1.8   Liver Function Tests: Recent Labs  Lab 03/11/20 0127  AST 32  ALT 20  ALKPHOS 97  BILITOT 2.2*  PROT 6.4*  ALBUMIN 3.0*   No results for input(s): LIPASE, AMYLASE in the last 168 hours. No results for input(s): AMMONIA in the last 168 hours. CBC: Recent Labs  Lab 03/09/20 1600 03/10/20 2337 03/11/20 0337  WBC 7.1 8.4 7.9  NEUTROABS  --  6.4  --   HGB 15.0 12.7* 12.5*  HCT 43.9 37.3* 37.3*  MCV 98.4 97.1 97.4  PLT 171 157 156   Cardiac Enzymes: No results for input(s): CKTOTAL, CKMB, CKMBINDEX, TROPONINI in the last 168 hours. BNP: Invalid input(s): POCBNP CBG: Recent Labs  Lab 03/10/20 2232 03/10/20 2307 03/11/20 0312 03/11/20 0835 03/11/20 1126  GLUCAP 87 86 102* 105* 85   D-Dimer No results for input(s): DDIMER in the last 72 hours. Hgb A1c Recent Labs    03/10/20 0245  HGBA1C 5.6   Lipid Profile Recent Labs    03/10/20 0246  CHOL 145  HDL 39*  LDLCALC 95  TRIG 53  CHOLHDL 3.7   Thyroid function studies No results for input(s): TSH, T4TOTAL, T3FREE, THYROIDAB in the last 72 hours.  Invalid input(s): FREET3 Anemia work up No results for input(s): VITAMINB12, FOLATE, FERRITIN, TIBC, IRON, RETICCTPCT in the last 72 hours. Urinalysis No results found for: COLORURINE, APPEARANCEUR, [place], [place], [place], [place], [place], [place], PROTEINUR, UROBILINOGEN, NITRITE, LEUKOCYTESUR Sepsis Labs Invalid input(s): PROCALCITONIN,  WBC,  LACTICIDVEN Microbiology Recent Results (from the past 240 hour(s))  SARS CORONAVIRUS 2 (TAT 6-24 HRS) Nasopharyngeal Nasopharyngeal Swab     Status: None   Collection Time: 03/09/20  9:15 PM   Specimen: Nasopharyngeal  Swab  Result Value Ref Range Status   SARS Coronavirus 2 NEGATIVE NEGATIVE Final     Comment: (NOTE) SARS-CoV-2 target nucleic acids are NOT DETECTED.  The SARS-CoV-2 RNA is generally detectable in upper and lower respiratory specimens during the acute phase of infection. Negative results do not preclude SARS-CoV-2 infection, do not rule out co-infections with other pathogens, and should not be used as the sole basis for treatment or other patient management decisions. Negative results must be combined with clinical observations, patient history, and epidemiological information. The expected result is Negative.  Fact Sheet for Patients: SugarRoll.be  Fact Sheet for Healthcare Providers: https://www.woods-mathews.com/  This test is not yet approved or cleared by the [place] FDA and  has been authorized for detection and/or diagnosis of SARS-CoV-2 by FDA under an Emergency Use Authorization (EUA). This EUA will remain  in effect (meaning this test can be used) for the duration of the COVID-19 declaration under Se ction 564(b)(1) of the Act, 21 U.S.C. section 360bbb-3(b)(1), unless the authorization is terminated or revoked sooner.  Performed at [facility], [place] [address] [place], [place] [address]      Time coordinating discharge: Over 30 minutes  SIGNED:   Little Ishikawa, DO [facility] 03/11/2020, 12:44 PM Pager   If 7PM-7AM, please contact night-coverage [email or web address]

## 2020-03-11 NOTE — Care Plan (Signed)
Re-evaluated patient for hematoma, nausea, low blood pressure.   Nausea improved with zofran, patient denies any acute complaints.   Neurological exam is stable. Able to name and repeat, follow commands, EOMI, Left eye VFF to confrontation, R eye APD stable, face symmetric, tongue midline, moves all extremities (RLE not tested due to groin hematoma). Hematoma stable from 11:30 PM to 1:30 AM.   Neurologically tolerating SBP of 80s, will keep MAP goal > 65 for now and consider pressors if he declines.  CMP and Trop pending  ECG stable on my read, hospitalist team also notified CBC and repeat Trop at 5 AM  Brooke Dare MD-PhD [facility] [phone] Available 7 PM to 7 AM, outside of these hours please call Neurologist on call as listed on Amion.

## 2020-03-11 NOTE — Progress Notes (Signed)
Pt completed bedrest and HOB raised to 30 degrees. R femoral site has grade 1 hematoma that is improving and becoming softer. Neurovascular assessments remain intact.

## 2020-03-11 NOTE — Progress Notes (Signed)
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 Karenann Cai. Patient awake and alert laying in bed with no complaints at this time. Asking to go home. Wife at bedside. Can spontaneously move all extremities. Had hematoma formation of right femoral puncture site last evening, site currently c/d/i.   Allergies: Patient has no known allergies.  Medications: Prior to Admission medications   Medication Sig Start Date End Date Taking? Authorizing Provider  Aspirin-Acetaminophen-Caffeine (GOODY HEADACHE PO) Take 1 packet by mouth daily as needed (For headache).   Yes [provider]  HYDROcodone-acetaminophen (NORCO/VICODIN) 5-325 MG tablet Take 1 tablet by mouth every 6 (six) hours as needed for moderate pain.   Yes [provider]  Pseudoephedrine-Acetaminophen (SINUS PO) Take 1 tablet by mouth daily as needed (For sinus).   Yes [provider]     Vital Signs: BP (!) 132/97   Pulse 68   Temp 98.2 F (36.8 C) (Oral)   Resp 13   Ht $R'5\' 7"'aI$  (1.702 m)   Wt 154 lb 5.2 oz (70 kg)   SpO2 99%   BMI 24.17 kg/m   Physical Exam Vitals and nursing note reviewed.  Constitutional:      General: He is not in acute distress. Pulmonary:     Effort: Pulmonary effort is normal. No respiratory distress.  Skin:    General: Skin is warm and dry.     Comments: Right femoral puncture site soft without active bleeding or hematoma.  Neurological:     Mental Status: He is alert.     Comments: Alert, awake, and oriented x3. Speech and comprehension intact. PERRL  bilaterally. Can spontaneously move all extremities. Distal pulses (DPs) 1+ bilaterally.     Imaging: CT Angio Head W or Wo Contrast  Result Date: 03/09/2020 CLINICAL DATA:  Right optic nerve edema. Abnormal vision right eye. Small embolic infarctions shown in the right cerebral hemisphere by MRI. EXAM: CT ANGIOGRAPHY HEAD AND NECK TECHNIQUE: Multidetector CT imaging of the head and neck was performed using the standard protocol during bolus administration of intravenous contrast. Multiplanar CT image reconstructions and MIPs were obtained to evaluate the vascular anatomy. Carotid stenosis measurements (when applicable) are obtained utilizing NASCET criteria, using the distal internal carotid diameter as the denominator. CONTRAST:  29mL OMNIPAQUE IOHEXOL 350 MG/ML SOLN COMPARISON:  MRI same day FINDINGS: CT HEAD FINDINGS Brain: Age related atrophy. Chronic small-vessel ischemic changes seen affecting the pons and cerebral hemispheric white matter. No sign of acute infarction by CT. No mass lesion, hemorrhage, hydrocephalus or extra-axial collection. Vascular: There is atherosclerotic calcification of the major vessels at the base of the brain. Skull: Negative Sinuses: Clear Orbits: Negative Review of the MIP images confirms the above findings CTA NECK FINDINGS Aortic arch: Aortic atherosclerosis. Vessel origins from the arch are not entirely included. Right carotid system: Common carotid artery widely patent to the bifurcation. Soft and calcified plaque in the ICA bulb. Severe stenosis at the distal bulb with diameter of only 1 mm. Compared to a more distal cervical ICA diameter of 4.9 mm, this indicates a 75-80% stenosis. Some atherosclerotic plaque of the upper cervical ICA just  proximal to the skull base but without stenosis more than about 20%. Left carotid system: Common carotid artery widely patent to the bifurcation. Minimal soft plaque in the ICA bulb. Calcified plaque at the more distal bulb and  proximal cervical ICA. Minimal diameter in that location is 2.5 mm. Compared to a more distal cervical ICA diameter of 5 mm, this indicates a 50% stenosis. Vertebral arteries: Right vertebral artery origin is patent. Atherosclerotic narrowing just beyond the origin of 50-70%. The vessel is widely patent beyond that through the cervical region to the foramen magnum. Severe stenosis at the left vertebral artery origin, 80% or greater. Beyond that, the vessel is widely patent to the foramen magnum. Skeleton: Ordinary spondylosis.  Chronic fusion C5 through C7. Other neck: No mass or lymphadenopathy. Some atrophic change of the right lobe of the thyroid. This could also be postsurgical. Upper chest: Benign appearing lipoma of the left posterior paraspinal musculature. Review of the MIP images confirms the above findings CTA HEAD FINDINGS Anterior circulation: Both internal carotid arteries are patent through the skull base and siphon regions. There is atherosclerotic calcification with stenosis estimated at 30% on both sides. Stenosis disease of the supraclinoid internal carotid arteries is more severe on the right where stenosis is estimated at 70%. The anterior and middle cerebral vessels are patent. No large or medium vessel occlusion. Posterior circulation: Both vertebral arteries are patent through the foramen magnum. There is calcified plaque of the left vertebral artery V4 segment but without stenosis greater than 30%. Both vertebral arteries reach the basilar. No basilar stenosis. Superior cerebellar and posterior cerebral arteries are patent. Venous sinuses: Patent and normal. Anatomic variants: None significant. Review of the MIP images confirms the above findings IMPRESSION: 1. Aortic atherosclerosis. 2. Vessel origins from the arch are not entirely included. 3. 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 Severe stenosis at the distal bulb with diameter of only 1 mm. Compared to a  more distal cervical ICA diameter of 4.9 mm, this indicates a 75-80% stenosis. 4. Calcified plaque at the left ICA bulb and proximal cervical ICA. Minimal diameter in that location is 2.5 mm. Compared to a more distal cervical ICA diameter of 5 mm, this indicates a 50% stenosis. 5. Right vertebral artery origin is patent. Atherosclerotic narrowing just beyond the origin of 50-70%. Severe stenosis at the left vertebral artery origin, 80% or greater. 6. Atherosclerotic disease in both carotid siphon regions. 30% stenosis on both sides. Stenosis of the supraclinoid internal carotid arteries is more severe on the right where stenosis is estimated at 70%. 7. No intracranial large or medium vessel occlusion or correctable proximal stenosis. 8. 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 Aortic Atherosclerosis (ICD10-I70.0). Electronically Signed   By: Paulina Fusi M.D.   On: 03/09/2020 21:55   CT Angio Neck W and/or Wo Contrast  Result Date: 03/09/2020 CLINICAL DATA:  Right optic nerve edema. Abnormal vision right eye. Small embolic infarctions shown in the right cerebral hemisphere by MRI. EXAM: CT ANGIOGRAPHY HEAD AND NECK TECHNIQUE: Multidetector CT imaging of the head and neck was performed using the standard protocol during bolus administration of intravenous contrast. Multiplanar CT image reconstructions and MIPs were obtained to evaluate the vascular anatomy. Carotid stenosis measurements (when applicable) are obtained utilizing NASCET criteria, using the distal internal carotid diameter as the denominator. CONTRAST:  77mL OMNIPAQUE IOHEXOL 350 MG/ML SOLN COMPARISON:  MRI  same day FINDINGS: CT HEAD FINDINGS Brain: Age related atrophy. Chronic small-vessel ischemic changes seen affecting the pons and cerebral hemispheric white matter. No sign of acute infarction by CT. No mass lesion, hemorrhage,  hydrocephalus or extra-axial collection. Vascular: There is atherosclerotic calcification of the major vessels at the base of the brain. Skull: Negative Sinuses: Clear Orbits: Negative Review of the MIP images confirms the above findings CTA NECK FINDINGS Aortic arch: Aortic atherosclerosis. Vessel origins from the arch are not entirely included. Right carotid system: Common carotid artery widely patent to the bifurcation. Soft and calcified plaque in the ICA bulb. Severe stenosis at the distal bulb with diameter of only 1 mm. Compared to a more distal cervical ICA diameter of 4.9 mm, this indicates a 75-80% stenosis. Some atherosclerotic plaque of the upper cervical ICA just proximal to the skull base but without stenosis more than about 20%. Left carotid system: Common carotid artery widely patent to the bifurcation. Minimal soft plaque in the ICA bulb. Calcified plaque at the more distal bulb and proximal cervical ICA. Minimal diameter in that location is 2.5 mm. Compared to a more distal cervical ICA diameter of 5 mm, this indicates a 50% stenosis. Vertebral arteries: Right vertebral artery origin is patent. Atherosclerotic narrowing just beyond the origin of 50-70%. The vessel is widely patent beyond that through the cervical region to the foramen magnum. Severe stenosis at the left vertebral artery origin, 80% or greater. Beyond that, the vessel is widely patent to the foramen magnum. Skeleton: Ordinary spondylosis.  Chronic fusion C5 through C7. Other neck: No mass or lymphadenopathy. Some atrophic change of the right lobe of the thyroid. This could also be postsurgical. Upper chest: Benign appearing lipoma of the left posterior paraspinal musculature. Review of the MIP images confirms the above findings CTA HEAD FINDINGS Anterior circulation: Both internal carotid arteries are patent through the skull base and siphon regions. There is atherosclerotic calcification with stenosis estimated at 30% on both  sides. Stenosis disease of the supraclinoid internal carotid arteries is more severe on the right where stenosis is estimated at 70%. The anterior and middle cerebral vessels are patent. No large or medium vessel occlusion. Posterior circulation: Both vertebral arteries are patent through the foramen magnum. There is calcified plaque of the left vertebral artery V4 segment but without stenosis greater than 30%. Both vertebral arteries reach the basilar. No basilar stenosis. Superior cerebellar and posterior cerebral arteries are patent. Venous sinuses: Patent and normal. Anatomic variants: None significant. Review of the MIP images confirms the above findings IMPRESSION: 1. Aortic atherosclerosis. 2. Vessel origins from the arch are not entirely included. 3. 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 Severe stenosis at the distal bulb with diameter of only 1 mm. Compared to a more distal cervical ICA diameter of 4.9 mm, this indicates a 75-80% stenosis. 4. Calcified plaque at the left ICA bulb and proximal cervical ICA. Minimal diameter in that location is 2.5 mm. Compared to a more distal cervical ICA diameter of 5 mm, this indicates a 50% stenosis. 5. Right vertebral artery origin is patent. Atherosclerotic narrowing just beyond the origin of 50-70%. Severe stenosis at the left vertebral artery origin, 80% or greater. 6. Atherosclerotic disease in both carotid siphon regions. 30% stenosis on both sides. Stenosis of the supraclinoid internal carotid arteries is more severe on the right where stenosis is estimated at 70%. 7. No intracranial large or medium vessel occlusion or correctable proximal stenosis. 8. In this patient with  micro embolic infarctions in the right hemisphere, the significant disease likely relates to the right-sided cervical ICA stenosis. Aortic Atherosclerosis (ICD10-I70.0). Electronically Signed   By: Nelson Chimes M.D.   On: 03/09/2020 21:55   MR Brain W and Wo  Contrast  Result Date: 03/09/2020 CLINICAL DATA:  Monocular right sided vision loss.  Papilledema. EXAM: MRI HEAD AND ORBITS WITHOUT AND WITH CONTRAST TECHNIQUE: Multiplanar, multiecho pulse sequences of the brain and surrounding structures were obtained without and with intravenous contrast. Multiplanar, multiecho pulse sequences of the orbits and surrounding structures were obtained including fat saturation techniques, before and after intravenous contrast administration. CONTRAST:  44mL GADAVIST GADOBUTROL 1 MMOL/ML IV SOLN COMPARISON:  None. FINDINGS: MRI HEAD FINDINGS Brain: The study suffers from motion degradation despite the technologist's best efforts. Diffusion imaging shows punctate acute infarction in the right parieto-occipital subcortical white matter. Few other punctate foci of acute infarction at the right frontoparietal junction vertex. No large confluent infarction. Findings are consistent with micro embolic insults. Elsewhere, there chronic small-vessel ischemic changes of the pons. Few old small vessel cerebellar infarctions. Cerebral hemispheres show pronounced chronic small-vessel ischemic changes throughout the deep and subcortical white matter. No mass, hemorrhage, hydrocephalus or extra-axial collection. After contrast administration, no abnormal brain enhancement occurs. Vascular: Major vessels at the base of the brain show flow. Skull and upper cervical spine: Negative Other: None MRI ORBITS FINDINGS This study also suffers from motion degradation. Orbits: Both globes appear normal. Both optic nerves appear normal. Orbital fat is normal. Extra-ocular muscles are normal. Visualized sinuses: Clear except for minimal left anterior ethmoid fluid. Soft tissues: Other regional soft tissues appear normal. IMPRESSION: 1. Motion degraded exam. 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 Few other punctate foci of acute infarction at the right frontoparietal  junction vertex. Findings consistent with micro embolic insults from the right carotid, heart or ascending aorta. No swelling or hemorrhage. Certainly there could be micro embolic disease to the right orbit not visible by MRI. 2. 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 3. The orbital study itself is normal. Electronically Signed   By: Nelson Chimes M.D.   On: 03/09/2020 20:58   IR INTRAVSC STENT CERV CAROTID W/EMB-PROT MOD SED  Result Date: 03/10/2020 INDICATION: 80 year old male with past medical history significant for chronic pain. He presented with right-sided vision loss. 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 He was transferred to our service for a diagnostic cerebral angiogram with carotid stenting. In anticipation to this procedure patient was loaded on aspirin and Brilinta. EXAM: Ultrasound-guided vascular access Diagnostic cerebral angiogram Carotid stenting with cerebral protection device COMPARISON:  CT/CT angiogram of the head and neck March 09, 2020. MEDICATIONS: A total of 6,000 units of heparin IV. ANESTHESIA/SEDATION: The procedure was performed under monitored anesthesia care. CONTRAST:  60 mL of Omnipaque 300 mg/mL FLUOROSCOPY TIME:  Fluoroscopy Time: 27 minutes 48 seconds (541.3 mGy). COMPLICATIONS: None immediate. TECHNIQUE: Informed written consent was obtained from the patient and his wife after a thorough discussion of the procedural risks, benefits and alternatives. All questions were addressed. Maximal Sterile Barrier Technique was utilized including caps, mask, sterile gowns, sterile gloves, sterile drape, hand hygiene and skin antiseptic. A timeout was performed prior to the initiation of the procedure. The right groin was prepped and draped in the usual sterile fashion. Using a micropuncture kit  and the modified  Seldinger technique, access was gained to the right common femoral artery and an 8 French sheath was placed. Real-time ultrasound guidance was utilized for vascular access including the acquisition of a permanent ultrasound image documenting patency of the accessed vessel. Under fluoroscopy, a 5 [place] Berenstein 2 catheter and a 0.035" Terumo Glidewire were navigated into the aortic arch. The catheter was placed into the common origin of innominate and left common carotid artery. Frontal angiograms of the neck were obtained. The catheter was then placed into the left subclavian artery. Frontal angiogram of the neck were obtained. The catheter was then advanced into the right common carotid artery. Frontal and lateral angiograms of the neck were obtained followed by frontal and lateral angiograms of the head. FINDINGS: 1. Soft plaque in the cervical right ICA just distal to the bulb resulting in approximately 75-80% stenosis. Irregularity with ulceration at the carotid bulb. 2. 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 Slow opacification of intracranial branches noted. 3. Patent right common femoral artery. PROCEDURE: The Berenstein 2 catheter was exchanged over the wire and under biplane roadmap for a 6 [place] shuttle sheath. Frontal and lateral angiograms of the neck were obtained. Using biplane roadmap a 2.5-4.8 Emboshield NAV 6 cerebral protection device was advanced into the distal cervical segment of the right ICA. Subsequently, a 6-8 x 40 mm XACT carotid stent was deployed in the cervical right ICA from the origin of the bulb to the mid cervical segment, covering the bulb ulceration and focal stenosis. Follow-up angiogram showed adequate stent deployment with resolution of stenosis and no evidence of complication. The cerebral protection device was subsequently recaptured. Delayed angiograms with frontal and lateral views of the neck showed no evidence  of in stent platelet aggregation. Right common carotid artery angiograms with frontal and lateral views of the head showed significant improvement of the anterograde flow to the right anterior circulation. Right common femoral artery angiograms were obtained with right anterior oblique and lateral views. The puncture is at the level of the common femoral artery which has adequate caliber. The femoral sheath was exchanged for a Perclose ProGlide which was utilized for access closure. Immediate hemostasis was achieved. IMPRESSION: Successful and uncomplicated cervical right internal carotid artery stenting with cerebral protection device for treatment of symptomatic stenosis. PLAN: Continue on dual anti-platelet therapy with Brilinta and aspirin. Follow-up carotid duplex at three-month postprocedure. Electronically Signed   By: Pedro Earls M.D.   On: 03/10/2020 18:43   IR US Guide Vasc Access Right  Result Date: 03/10/2020 INDICATION: 80 year old male with past medical history significant for chronic pain. He presented with right-sided vision loss. 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 He was transferred to our service for a diagnostic cerebral angiogram with carotid stenting. In anticipation to this procedure patient was loaded on aspirin and Brilinta. EXAM: Ultrasound-guided vascular access Diagnostic cerebral angiogram Carotid stenting with cerebral protection device COMPARISON:  CT/CT angiogram of the head and neck March 09, 2020. MEDICATIONS: A total of 6,000 units of heparin IV. ANESTHESIA/SEDATION: The procedure was performed under monitored anesthesia care. CONTRAST:  60 mL of Omnipaque 300 mg/mL FLUOROSCOPY TIME:  Fluoroscopy Time: 27 minutes 48 seconds (541.3 mGy). COMPLICATIONS: None immediate. TECHNIQUE: Informed written consent was obtained from the patient and his wife after  a thorough discussion of the procedural risks, benefits and  alternatives. All questions were addressed. Maximal Sterile Barrier Technique was utilized including caps, mask, sterile gowns, sterile gloves, sterile drape, hand hygiene and skin antiseptic. A timeout was performed prior to the initiation of the procedure. The right groin was prepped and draped in the usual sterile fashion. Using a micropuncture kit and the modified Seldinger technique, access was gained to the right common femoral artery and an 8 French sheath was placed. Real-time ultrasound guidance was utilized for vascular access including the acquisition of a permanent ultrasound image documenting patency of the accessed vessel. Under fluoroscopy, a 5 [place] Berenstein 2 catheter and a 0.035" Terumo Glidewire were navigated into the aortic arch. The catheter was placed into the common origin of innominate and left common carotid artery. Frontal angiograms of the neck were obtained. The catheter was then placed into the left subclavian artery. Frontal angiogram of the neck were obtained. The catheter was then advanced into the right common carotid artery. Frontal and lateral angiograms of the neck were obtained followed by frontal and lateral angiograms of the head. FINDINGS: 1. Soft plaque in the cervical right ICA just distal to the bulb resulting in approximately 75-80% stenosis. Irregularity with ulceration at the carotid bulb. 2. 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 Slow opacification of intracranial branches noted. 3. Patent right common femoral artery. PROCEDURE: The Berenstein 2 catheter was exchanged over the wire and under biplane roadmap for a 6 [place] shuttle sheath. Frontal and lateral angiograms of the neck were obtained. Using biplane roadmap a 2.5-4.8 Emboshield NAV 6 cerebral protection device was advanced into the distal cervical segment of the right ICA. Subsequently, a  6-8 x 40 mm XACT carotid stent was deployed in the cervical right ICA from the origin of the bulb to the mid cervical segment, covering the bulb ulceration and focal stenosis. Follow-up angiogram showed adequate stent deployment with resolution of stenosis and no evidence of complication. The cerebral protection device was subsequently recaptured. Delayed angiograms with frontal and lateral views of the neck showed no evidence of in stent platelet aggregation. Right common carotid artery angiograms with frontal and lateral views of the head showed significant improvement of the anterograde flow to the right anterior circulation. Right common femoral artery angiograms were obtained with right anterior oblique and lateral views. The puncture is at the level of the common femoral artery which has adequate caliber. The femoral sheath was exchanged for a Perclose ProGlide which was utilized for access closure. Immediate hemostasis was achieved. IMPRESSION: Successful and uncomplicated cervical right internal carotid artery stenting with cerebral protection device for treatment of symptomatic stenosis. PLAN: Continue on dual anti-platelet therapy with Brilinta and aspirin. Follow-up carotid duplex at three-month postprocedure. Electronically Signed   By: Pedro Earls M.D.   On: 03/10/2020 18:43   ECHOCARDIOGRAM COMPLETE  Result Date: 03/10/2020    ECHOCARDIOGRAM REPORT   Patient Name:   Michael Wells Date of Exam: 03/10/2020 Medical Rec #:  [id number]      Height:       67.0 in Accession #:    [id number]     Weight:       170.0 lb Date of Birth:  [birth year]      BSA:          1.887 m Patient Age:    35 years       BP:           109/83 mmHg Patient Gender: M  HR:           93 bpm. Exam Location:  Inpatient Procedure: 2D Echo, Cardiac Doppler and Color Doppler Indications:    Stroke I63.9  History:        Patient has no prior history of Echocardiogram examinations.  Sonographer:    Merrie Roof Referring Phys: [id number] Barlow T TU IMPRESSIONS  1. Left ventricular ejection fraction, by estimation, is 60 to 65%. The left ventricle has normal function. The left ventricle has no regional wall motion abnormalities. Left ventricular diastolic parameters are consistent with Grade I diastolic dysfunction (impaired relaxation).  2. Right ventricular systolic function is normal. The right ventricular size is normal.  3. The mitral valve is normal in structure. No evidence of mitral valve regurgitation. No evidence of mitral stenosis.  4. The aortic valve is normal in structure. Aortic valve regurgitation is not visualized. No aortic stenosis is present.  5. The inferior vena cava is normal in size with greater than 50% respiratory variability, suggesting right atrial pressure of 3 mmHg. Conclusion(s)/Recommendation(s): No intracardiac source of embolism detected on this transthoracic study. A transesophageal echocardiogram is recommended to exclude cardiac source of embolism if clinically indicated. FINDINGS  Left Ventricle: Left ventricular ejection fraction, by estimation, is 60 to 65%. The left ventricle has normal function. The left ventricle has no regional wall motion abnormalities. The left ventricular internal cavity size was normal in size. There is  no left ventricular hypertrophy. Left ventricular diastolic parameters are consistent with Grade I diastolic dysfunction (impaired relaxation). Right Ventricle: The right ventricular size is normal. No increase in right ventricular wall thickness. Right ventricular systolic function is normal. Left Atrium: Left atrial size was normal in size. Right Atrium: Right atrial size was normal in size. Pericardium: There is no evidence of pericardial effusion. Mitral Valve: The mitral valve is normal in structure. No evidence of mitral valve regurgitation. No evidence of mitral valve stenosis. Tricuspid Valve: The tricuspid valve is normal in structure. Tricuspid  valve regurgitation is not demonstrated. No evidence of tricuspid stenosis. Aortic Valve: The aortic valve is normal in structure. Aortic valve regurgitation is not visualized. No aortic stenosis is present. Aortic valve mean gradient measures 4.0 mmHg. Aortic valve peak gradient measures 6.9 mmHg. Aortic valve area, by VTI measures 2.38 cm. Pulmonic Valve: The pulmonic valve was normal in structure. Pulmonic valve regurgitation is not visualized. No evidence of pulmonic stenosis. Aorta: The aortic root is normal in size and structure. Venous: The inferior vena cava is normal in size with greater than 50% respiratory variability, suggesting right atrial pressure of 3 mmHg. IAS/Shunts: No atrial level shunt detected by color flow Doppler.  LEFT VENTRICLE PLAX 2D LVIDd:         4.60 cm LVIDs:         3.00 cm LV PW:         1.00 cm LV IVS:        1.00 cm LVOT diam:     2.00 cm LV SV:         49 LV SV Index:   26 LVOT Area:     3.14 cm  LV Volumes (MOD) LV vol d, MOD A4C: 54.0 ml LV SV MOD A4C:     54.0 ml LEFT ATRIUM           Index LA diam:      3.60 cm 1.91 cm/m LA Vol (A2C): 46.2 ml 24.48 ml/m LA Vol (A4C): 49.4 ml 26.18 ml/m  AORTIC VALVE AV Area (Vmax):    2.34 cm AV Area (Vmean):   2.26 cm AV Area (VTI):     2.38 cm AV Vmax:           131.00 cm/s AV Vmean:          91.200 cm/s AV VTI:            0.205 m AV Peak Grad:      6.9 mmHg AV Mean Grad:      4.0 mmHg LVOT Vmax:         97.50 cm/s LVOT Vmean:        65.600 cm/s LVOT VTI:          0.155 m LVOT/AV VTI ratio: 0.76  AORTA Ao Root diam: 3.40 cm Ao Asc diam:  3.40 cm  SHUNTS Systemic VTI:  0.16 m Systemic Diam: 2.00 cm Candee Furbish MD Electronically signed by Candee Furbish MD Signature Date/Time: 03/10/2020/3:27:45 PM    Final    MR ORBITS W WO CONTRAST  Result Date: 03/09/2020 CLINICAL DATA:  Monocular right sided vision loss.  Papilledema. EXAM: MRI HEAD AND ORBITS WITHOUT AND WITH CONTRAST TECHNIQUE: Multiplanar, multiecho pulse sequences of the brain  and surrounding structures were obtained without and with intravenous contrast. Multiplanar, multiecho pulse sequences of the orbits and surrounding structures were obtained including fat saturation techniques, before and after intravenous contrast administration. CONTRAST:  23mL GADAVIST GADOBUTROL 1 MMOL/ML IV SOLN COMPARISON:  None. FINDINGS: MRI HEAD FINDINGS Brain: The study suffers from motion degradation despite the technologist's best efforts. Diffusion imaging shows punctate acute infarction in the right parieto-occipital subcortical white matter. Few other punctate foci of acute infarction at the right frontoparietal junction vertex. No large confluent infarction. Findings are consistent with micro embolic insults. Elsewhere, there chronic small-vessel ischemic changes of the pons. Few old small vessel cerebellar infarctions. Cerebral hemispheres show pronounced chronic small-vessel ischemic changes throughout the deep and subcortical white matter. No mass, hemorrhage, hydrocephalus or extra-axial collection. After contrast administration, no abnormal brain enhancement occurs. Vascular: Major vessels at the base of the brain show flow. Skull and upper cervical spine: Negative Other: None MRI ORBITS FINDINGS This study also suffers from motion degradation. Orbits: Both globes appear normal. Both optic nerves appear normal. Orbital fat is normal. Extra-ocular muscles are normal. Visualized sinuses: Clear except for minimal left anterior ethmoid fluid. Soft tissues: Other regional soft tissues appear normal. IMPRESSION: 1. Motion degraded exam. 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 Few other punctate foci of acute infarction at the right frontoparietal junction vertex. Findings consistent with micro embolic insults from the right carotid, heart or ascending aorta. No swelling or hemorrhage. Certainly there could be micro embolic disease to the right orbit not  visible by MRI. 2. 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 3. The orbital study itself is normal. Electronically Signed   By: Nelson Chimes M.D.   On: 03/09/2020 20:58   IR ANGIO VERTEBRAL SEL SUBCLAVIAN INNOMINATE UNI L MOD SED  Result Date: 03/10/2020 INDICATION: 80 year old male with past medical history significant for chronic pain. He presented with right-sided vision loss. 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 He was transferred to our service for a diagnostic cerebral angiogram with carotid stenting. In anticipation to this procedure patient was loaded on aspirin and Brilinta. EXAM: Ultrasound-guided vascular  access Diagnostic cerebral angiogram Carotid stenting with cerebral protection device COMPARISON:  CT/CT angiogram of the head and neck March 09, 2020. MEDICATIONS: A total of 6,000 units of heparin IV. ANESTHESIA/SEDATION: The procedure was performed under monitored anesthesia care. CONTRAST:  60 mL of Omnipaque 300 mg/mL FLUOROSCOPY TIME:  Fluoroscopy Time: 27 minutes 48 seconds (541.3 mGy). COMPLICATIONS: None immediate. TECHNIQUE: Informed written consent was obtained from the patient and his wife after a thorough discussion of the procedural risks, benefits and alternatives. All questions were addressed. Maximal Sterile Barrier Technique was utilized including caps, mask, sterile gowns, sterile gloves, sterile drape, hand hygiene and skin antiseptic. A timeout was performed prior to the initiation of the procedure. The right groin was prepped and draped in the usual sterile fashion. Using a micropuncture kit and the modified Seldinger technique, access was gained to the right common femoral artery and an 8 French sheath was placed. Real-time ultrasound guidance was utilized for vascular access including the acquisition of a  permanent ultrasound image documenting patency of the accessed vessel. Under fluoroscopy, a 5 [place] Berenstein 2 catheter and a 0.035" Terumo Glidewire were navigated into the aortic arch. The catheter was placed into the common origin of innominate and left common carotid artery. Frontal angiograms of the neck were obtained. The catheter was then placed into the left subclavian artery. Frontal angiogram of the neck were obtained. The catheter was then advanced into the right common carotid artery. Frontal and lateral angiograms of the neck were obtained followed by frontal and lateral angiograms of the head. FINDINGS: 1. Soft plaque in the cervical right ICA just distal to the bulb resulting in approximately 75-80% stenosis. Irregularity with ulceration at the carotid bulb. 2. 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 Slow opacification of intracranial branches noted. 3. Patent right common femoral artery. PROCEDURE: The Berenstein 2 catheter was exchanged over the wire and under biplane roadmap for a 6 [place] shuttle sheath. Frontal and lateral angiograms of the neck were obtained. Using biplane roadmap a 2.5-4.8 Emboshield NAV 6 cerebral protection device was advanced into the distal cervical segment of the right ICA. Subsequently, a 6-8 x 40 mm XACT carotid stent was deployed in the cervical right ICA from the origin of the bulb to the mid cervical segment, covering the bulb ulceration and focal stenosis. Follow-up angiogram showed adequate stent deployment with resolution of stenosis and no evidence of complication. The cerebral protection device was subsequently recaptured. Delayed angiograms with frontal and lateral views of the neck showed no evidence of in stent platelet aggregation. Right common carotid artery angiograms with frontal and lateral views of the head showed significant improvement of the anterograde flow to the right anterior  circulation. Right common femoral artery angiograms were obtained with right anterior oblique and lateral views. The puncture is at the level of the common femoral artery which has adequate caliber. The femoral sheath was exchanged for a Perclose ProGlide which was utilized for access closure. Immediate hemostasis was achieved. IMPRESSION: Successful and uncomplicated cervical right internal carotid artery stenting with cerebral protection device for treatment of symptomatic stenosis. PLAN: Continue on dual anti-platelet therapy with Brilinta and aspirin. Follow-up carotid duplex at three-month postprocedure. Electronically Signed   By: Pedro Earls M.D.   On: 03/10/2020 18:43    Labs:  CBC: Recent Labs    03/09/20 1600 03/10/20 2337 03/11/20 0337  WBC 7.1 8.4 7.9  HGB 15.0 12.7* 12.5*  HCT 43.9 37.3* 37.3*  PLT 171 157 156    COAGS: Recent Labs    03/10/20 1050  INR 1.0    BMP: Recent Labs    03/09/20 1600 03/11/20 0127  NA 138 135  K 3.9 3.5  CL 107 108  CO2 20* 18*  GLUCOSE 95 121*  BUN 11 10  CALCIUM 10.7* 10.3  CREATININE 1.03 0.87  GFRNONAA >60 >60    LIVER FUNCTION TESTS: Recent Labs    03/11/20 0127  BILITOT 2.2*  AST 32  ALT 20  ALKPHOS 97  PROT 6.4*  ALBUMIN 3.0*    Assessment and Plan:  History of right vision changes and punctate acute infarcts in the right parieto-occipital white matter in setting of severe right cervical ICA stenosis s/p endovascular revascularization using XACT stent placement via right femoral approach 03/10/2020 by Dr. Karenann Cai. Patient's condition stable, neurologically intact. Right femoral puncture site stable (hematoma resolved), distal pulses (DPs) 1+ bilaterally. Patient stable for discharge from [facility] standpoint. No stooping, bending, or lifting more than 10 pounds for 1 week post-procedure. Recommend counter pressure to right groin when climbing stairs/getting in and out of car. Continue  taking Brilinta 90 mg twice daily and Aspirin 81 mg once daily. Plan to follow-up with Dr. Karenann Cai with carotid US followed by consult 3 months after discharge ([facility] schedulers to call patient to set up this appointment). Further plans per [facility]- appreciate and agree with management. Please call [facility] with questions/concerns.   Electronically Signed: Earley Abide, PA-C 03/11/2020, 10:04 AM   I spent a total of 25 Minutes at the the patient's bedside AND on the patient's hospital floor or unit, greater than 50% of which was counseling/coordinating care for right cervical ICA stenosis s/p revascularization.

## 2020-03-13 ENCOUNTER — Encounter (HOSPITAL_COMMUNITY): Payer: Self-pay | Admitting: Radiology

## 2020-03-13 LAB — GLUCOSE, CAPILLARY: Glucose-Capillary: 57 mg/dL — ABNORMAL LOW (ref 70–99)

## 2020-03-16 ENCOUNTER — Other Ambulatory Visit: Payer: Self-pay | Admitting: *Deleted

## 2020-03-16 NOTE — Patient Outreach (Signed)
[facility]) Care Management  03/16/2020  GAVINN COLLARD 1940-03-29 [id number]   Member was admitted to hospital 2/24-2/26 for stroke.  Received EMMI follow up call, no red alerts but member/wife did have questions.  Call placed to wife, member's identity verified.  Identity verified.  This care manager introduced self and stated purpose of call.  [facility] care management services explained.  She report member has been doing fine, will have outpatient PT/OT evaluation next Wednesday.  He only question was when will he be able to drive again.  She is advised to discuss with neurology during follow up.  State there was a referral placed however his insurance will not cover that particular physician.  She has a call out to PCP for a new referral, awaiting call back.  Denies any further needs/questions.  Will close case at this time.  Kemper Durie, [place], MSN [facility] Care Management  [facility] Manager [phone]

## 2020-03-20 ENCOUNTER — Other Ambulatory Visit: Payer: Self-pay | Admitting: *Deleted

## 2020-03-20 NOTE — Patient Instructions (Signed)
American Journal of Respiratory and Critical Care Medicine, 202(2), e5-e31. https://doi.org/10.1164/rccm.202005-1982ST">  Health Risks of Smoking Smoking tobacco is very bad for your health. Tobacco smoke contains many toxic chemicals that can damage every part of your body. Secondhand smoke can be harmful to those around you. Tobacco or nicotine use can cause many long-term (chronic) diseases. Smoking is difficult to quit because a chemical in tobacco, called nicotine, causes addiction or dependence. When you smoke and inhale, nicotine is absorbed quickly into the bloodstream through your lungs. Both inhaled and non-inhaled nicotine may be addictive. How can quitting affect me? There are health benefits of quitting smoking. Some benefits happen right away and others take time. Benefits may include:  Blood flow, blood pressure, heart rate, and lung capacity may begin to improve. However, any lung damage that has already occurred cannot be repaired.  Temporary respiratory symptoms, such as nasal congestion and cough, may improve over time.  Your risk of heart disease, stroke, and cancer is reduced.  The overall quality of your health may improve.  You may save money, as you will not spend money on tobacco products and may spend less money on smoking-related health issues. What can increase my risk? Smoking harms nearly every organ in the body. People who smoke tobacco have a shorter life expectancy and an increased risk of many serious medical problems. These include:  More respiratory infections, such as colds and pneumonia.  Cancer.  Heart disease.  Stroke.  Chronic respiratory diseases.  Delayed wound healing and increased risk of complications during surgery.  Problems with reproduction, pregnancy, and childbirth, such as infertility, early (premature) births, stillbirths, and birth defects. Secondhand smoke exposure to children increases the risk of:  Sudden infant death  syndrome (SIDS).  Infections in the nose, throat, or airways (respiratory infections).  Chronic respiratory symptoms.   What actions can I take to quit? Smoking is an addiction that affects both your body and your mind, and long-time habits can be hard to change. Your health care provider can recommend:  Nicotine replacement products, such as patches, gum, and nasal sprays. Use these products only as directed. Do not replace cigarette smoking with electronic cigarettes, which are commonly called e-cigarettes. The safety of e-cigarettes is not known, and some may contain harmful chemicals.  Programs and community resources, which may include group support, education, or talk therapy.  Prescription medicines to help reduce cravings.  A combination of two or more quit methods, which will increase the success of quitting.   Where to find support Follow the recommendations from your health care provider about support groups and other assistance. You can also visit:  [employer] American Quitline Consortium: www.naquitline.org or call 1-800-QUIT-NOW.  U.S. Department of Health and Human Services: www.smokefree.gov  American Lung Association: www.freedomfromsmoking.org  American Heart Association: www.heart.org Where to find more information  Centers for Disease Control and Prevention: www.cdc.gov  World Health Organization: www.who.int Summary  Smoking tobacco is very bad for your health. Tobacco smoke contains many toxic chemicals that can damage every part of the body.  Smoking is difficult to quit because a chemical in tobacco, called nicotine, causes addiction or dependence.  There are immediate and long-term health benefits of quitting smoking.  A combination of two or more quit methods increases the success of quitting. This information is not intended to replace advice given to you by your health care provider. Make sure you discuss any questions you have with your health care  provider. Document Revised: 02/15/2019 Document Reviewed: 02/15/2019   Elsevier Patient Education  2021 Elsevier Inc.  

## 2020-03-20 NOTE — Patient Outreach (Signed)
[facility]) Care Management  03/20/2020  Michael Wells 1941-01-03 [id number]   RED ON EMMI ALERT - Stroke Day # 6 Date: 3/5 Red Alert Reason: Smoked or been around smoke   Outreach attempt #1, successful to wife.  She reports member is doing really well, admits that he is still smoking.  State she has advised him against it but he continues to smoke a couple cigarettes a day.  She will agrees to have smoking cessation education mailed to member in hopes of this encouraging him to quit.  Denies any other concerns at this time.   Plan: RN CM will send education on dangers of smoking.  Will follow up within the next 2 weeks.  Kemper Durie, [place], MSN [facility] Care Management  [facility] Manager [phone]

## 2020-03-22 ENCOUNTER — Ambulatory Visit: Payer: Medicare (Managed Care) | Admitting: Occupational Therapy

## 2020-03-22 ENCOUNTER — Encounter: Payer: Self-pay | Admitting: Physical Therapy

## 2020-03-22 ENCOUNTER — Ambulatory Visit: Payer: Medicare (Managed Care) | Attending: Internal Medicine | Admitting: Physical Therapy

## 2020-03-22 ENCOUNTER — Encounter: Payer: Self-pay | Admitting: Occupational Therapy

## 2020-03-22 ENCOUNTER — Other Ambulatory Visit: Payer: Self-pay

## 2020-03-22 DIAGNOSIS — R41840 Attention and concentration deficit: Secondary | ICD-10-CM | POA: Diagnosis present

## 2020-03-22 DIAGNOSIS — R29818 Other symptoms and signs involving the nervous system: Secondary | ICD-10-CM | POA: Diagnosis present

## 2020-03-22 DIAGNOSIS — R41842 Visuospatial deficit: Secondary | ICD-10-CM

## 2020-03-22 DIAGNOSIS — R41844 Frontal lobe and executive function deficit: Secondary | ICD-10-CM

## 2020-03-22 DIAGNOSIS — R2681 Unsteadiness on feet: Secondary | ICD-10-CM | POA: Insufficient documentation

## 2020-03-22 DIAGNOSIS — M6281 Muscle weakness (generalized): Secondary | ICD-10-CM

## 2020-03-22 DIAGNOSIS — R2689 Other abnormalities of gait and mobility: Secondary | ICD-10-CM | POA: Diagnosis present

## 2020-03-22 NOTE — Therapy (Signed)
[facility] [address] [place], [place], [address] Phone: [phone]   Fax:  [phone]  Occupational Therapy Evaluation  Patient Details  Name: Michael BarlowHenry H Reha MRN: [id number] Date of Birth: 1940-05-20 Referring Provider (OT): Dr. Natale MilchLancaster (will send to PCP as pt referred by hospitalist) Eileen StanfordKimberley Milsaps, NP   Encounter Date: 03/22/2020   OT End of Session - 03/22/20 1256    Visit Number 1    Number of Visits 25    Date for OT Re-Evaluation 06/14/20    Authorization Type [employer], out of network    Authorization Time Period 90 days-06/14/20    Authorization - Visit Number 1    Authorization - Number of Visits 10    OT Start Time 0720    OT Stop Time 0800    OT Time Calculation (min) 40 min           Past Medical History:  Diagnosis Date  . Anxiety   . Chronic pain   . H/O sleep disturbance     Past Surgical History:  Procedure Laterality Date  . IR ANGIO VERTEBRAL SEL SUBCLAVIAN INNOMINATE UNI L MOD SED  03/10/2020  . IR INTRAVSC STENT CERV CAROTID W/EMB-PROT MOD SED INCL ANGIO  03/10/2020  . IR STENT PLACEMENT ANTE CAROTID INC ANGIO  03/10/2020      . IR US GUIDE VASC ACCESS RIGHT  03/10/2020  . RADIOLOGY WITH ANESTHESIA N/A 03/10/2020   Procedure: IR WITH ANESTHESIA;  Surgeon: Radiologist, Medication, MD;  Location: [facility] OR;  Service: Radiology;  Laterality: N/A;    There were no vitals filed for this visit.   Subjective Assessment - 03/22/20 0724    Subjective  Pt reports his vision got blurry    Pertinent History 80 y.o. 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    Patient Stated Goals improve balance and vision    Currently in Pain? Yes    Pain Score 3     Pain Location Shoulder    Pain Orientation Right    Pain  Descriptors / Indicators Sore    Pain Type Acute pain    Pain Onset 1 to 4 weeks ago    Pain Frequency Intermittent    Aggravating Factors  reaching    Pain Relieving Factors rest             [facility] - 03/22/20 0727      Assessment   Medical Diagnosis CVA    Referring Provider (OT) Dr. Natale MilchLancaster   will send to PCP as pt referred by hospitalist   Onset Date/Surgical Date 03/09/20    Prior Therapy Acute OT      Precautions   Precautions Fall      Balance Screen   Has the patient fallen in the past 6 months No    Has the patient had a decrease in activity level because of a fear of falling?  Yes    Is the patient reluctant to leave their home because of a fear of falling?  No      Home  Environment   Family/patient expects to be discharged to: Private residence    [employer]ard[employer]athroom Shower/Tub Tub/Shower unit    Lives With Spouse      Prior Function   Level of Independence Independent    Vocation Retired  Leisure Play golf      ADL   Eating/Feeding Modified independent    Grooming Modified independent    Upper Body Bathing Supervision/safety   Takes sponge bath   Lower Body Bathing Supervision/safety    Upper Body Dressing Supervision/safety    Lower Body Dressing Supervision/safety    Toilet Transfer Modified independent    Tub/Shower Transfer Details (indicate cue type and reason --   currently sponge bathing     IADL   Shopping Needs to be accompanied on any shopping trip    Light Housekeeping Does not participate in any housekeeping tasks    Meal Prep Needs to have meals prepared and served    Medication Management Is not capable of dispensing or managing own medication   wife provides   Financial Management Requires assistance      Mobility   Mobility Status --   supervision     Written Expression   Dominant Hand Right    Handwriting 100% legible      Vision - History   Baseline Vision Wears glasses all the time      Vision Assessment   Ocular  Range of Motion Within Functional Limits    Tracking/Visual Pursuits Able to track stimulus in all quads without difficulty    Convergence Impaired (comment)    Visual Fields No apparent deficits   To be further assesed in functional context ,diplopia with near [facility]   Comment blurry vision in right eye, unable to correctly complete clock drawing test, or draw a cube.      Cognition   Overall Cognitive Status Impaired/Different from baseline    Area of Impairment Memory;Awareness    Memory Decreased short-term memory    Awareness Intellectual    Awareness Comments impaired intellectual awareness    Memory Impaired    Memory Impairment Decreased short term memory    Awareness Impaired    Awareness Impairment --   decreased intellectual awareness   Problem Solving Impaired    Cognition Comments delayed processing, unable to correctly complete trailmaking A, oriented to day date and place      Coordination   9 Hole Peg Test Right;Left    Right 9 Hole Peg Test not tested    Left 9 Hole Peg Test not tested      ROM / Strength   AROM / PROM / Strength AROM;Strength      AROM   Overall AROM  Within functional limits for tasks performed      Strength   Overall Strength Deficits    Overall Strength Comments Bilateral proximal shoulder strength 4/5, RUE distal 4/5, LUE distal 4+/5      Hand Function   Right Hand Grip (lbs) 45.1    Left Hand Grip (lbs) 47.6                             OT Short Term Goals - 03/22/20 1238      OT SHORT TERM GOAL #1   Title I with inital HEP    Time 4    Period Weeks    Status New    Target Date 04/19/20      OT SHORT TERM GOAL #2   Title Pt will verbalize understanding of compensatory strategeis for visual deficits    Time 4    Period Weeks    Status New      OT SHORT TERM GOAL #3   Title  Pt/ wife will verbalize understanding of compensatory strategeis for cognitve/memory deficits.    Time 4    Period Weeks    Status  New      OT SHORT TERM GOAL #4   Title Pt will perform tabletop scanning activities with  80% or better accuracy.    Time 4    Period Weeks    Status New      OT SHORT TERM GOAL #5   Title Pt will navigate a min distracting environment and locate items with 75% or better accuracy.    Time 4    Period Weeks    Status New      Additional Short Term Goals   Additional Short Term Goals Yes      OT SHORT TERM GOAL #6   Title Pt will demonstrate ability to sequence a simple functional task with 90% or better accuracy in a reasonable amount of time    Baseline delayed processing    Time 4    Period Weeks    Status New             OT Long Term Goals - 03/22/20 1241      OT LONG TERM GOAL #1   Title I with updated HEP.    Time 12    Period Weeks    Status New    Target Date 06/14/20      OT LONG TERM GOAL #2   Title Pt will perfom environmental scanning activities in a mod distracting environment with 90% better accuracy.    Time 12    Period Weeks      OT LONG TERM GOAL #3   Title Pt will perfrom shower transfer and shower, at a modified independent level    Baseline pt is currently sponge bathing.    Time 12    Period Weeks    Status New      OT LONG TERM GOAL #4   Title Pt will retrieve 3 lbs fom an overhead shelf with RUE with pain no greater than 2/10    Time 12    Period Weeks    Status New      OT LONG TERM GOAL #5   Title Test 9 hole peg test and set goal prn    Time 12    Period Weeks    Status New      Long Term Additional Goals   Additional Long Term Goals Yes      OT LONG TERM GOAL #6   Title I with all basic ADLs.    Time 12    Period Weeks    Status New                 Plan - 03/22/20 1252    Clinical Impression Statement Pt is a 80 y.o male hospitalized 03/09/20 with visual changes. MRI brain with  punctate acute infarcts in the right parieto-occipital region. Pt was d/c home on 03/11/20. Pt presents to occupational therapy with the  following deficits: cogntive deficits, decreased UE strength, visual deficits, decreased balance and functional moilitywhich impedes perfromance of ADLs/ IADLs. Pt. can benefit form skilled OT to address these deficits in order to maximize pt's safety and I with daily activities.  PTA pt living at home independently with his wife. Pt drove and managed his own medication.    OT Occupational Profile and History Detailed Assessment- Review of Records and additional review of physical, cognitive, psychosocial history related to current  functional performance    Occupational performance deficits (Please refer to evaluation for details): ADL's;IADL's;Leisure;Social Participation    Body Structure / Function / Physical Skills ADL;UE functional use;Balance;Pain;Gait;ROM;GMC;Coordination;Decreased knowledge of precautions;IADL;Dexterity;Mobility    Cognitive Skills Attention;Learn;Memory;Perception;Problem Solve;Safety Awareness;Sequencing;Thought;Understand    Rehab Potential Good    Clinical Decision Making Limited treatment options, no task modification necessary    Comorbidities Affecting Occupational Performance: May have comorbidities impacting occupational performance    Modification or Assistance to Complete Evaluation  No modification of tasks or assist necessary to complete eval    OT Frequency 2x / week   plus eval, anticipate d/c after 8 weeks   OT Duration 12 weeks    OT Treatment/Interventions Self-care/ADL training;Ultrasound;Energy conservation;Visual/perceptual remediation/compensation;Patient/family education;DME and/or AE instruction;Paraffin;Gait Training;Passive range of motion;Balance training;Fluidtherapy;Cryotherapy;Building services engineer;Therapeutic activities;Manual Therapy;Therapeutic exercise;Moist Heat;Neuromuscular education;Cognitive remediation/compensation    Plan further assess vision in functional context, table top and environmental scanning, vision compensations, test 9  hole peg test and set goal prn    Consulted and Agree with Plan of Care Patient;Family member/caregiver    Family Member Consulted wife           Patient will benefit from skilled therapeutic intervention in order to improve the following deficits and impairments:   Body Structure / Function / Physical Skills: ADL,UE functional use,Balance,Pain,Gait,ROM,GMC,Coordination,Decreased knowledge of precautions,IADL,Dexterity,Mobility Cognitive Skills: Attention,Learn,Memory,Perception,Problem Solve,Safety Awareness,Sequencing,Thought,Understand     Visit Diagnosis: Frontal lobe and executive function deficit - Plan: Ot plan of care cert/re-cert  Unsteadiness on feet - Plan: Ot plan of care cert/re-cert  Muscle weakness (generalized) - Plan: Ot plan of care cert/re-cert  Other symptoms and signs involving the nervous system - Plan: Ot plan of care cert/re-cert  Visuospatial deficit - Plan: Ot plan of care cert/re-cert  Attention and concentration deficit - Plan: Ot plan of care cert/re-cert    Problem List Patient Active Problem List   Diagnosis Date Noted  . Acute ischemic stroke (HCC) 03/10/2020  . 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 Chronic pain 03/09/2020    Kash Mothershead 03/22/2020, 1:02 PM  [place] [facility] [address] [place], [place], [address] Phone: [phone]   Fax:  [phone]  Name: DAMEAN POFFENBERGER MRN: [id number] Date of Birth: [birth year]

## 2020-03-22 NOTE — Therapy (Signed)
[facility] [address] [place], [place], [address] Phone: [phone]   Fax:  [phone]  Physical Therapy Evaluation  Patient Details  Name: Michael Wells MRN: [id number] Date of Birth: 02-07-1940 Referring Provider (PT): Azucena FallenLancaster, William C, MD   Encounter Date: 03/22/2020   PT End of Session - 03/22/20 0915    Visit Number 1    Number of Visits 17    Date for PT Re-Evaluation 13-Jun-2020   written for 60 day POC   Authorization Type [employer]    PT Start Time 0801    PT Stop Time 0845    PT Time Calculation (min) 44 min    Equipment Utilized During Treatment Gait belt    Activity Tolerance Patient tolerated treatment well    Behavior During Therapy [facility] for tasks assessed/performed           Past Medical History:  Diagnosis Date  . Anxiety   . Chronic pain   . H/O sleep disturbance     Past Surgical History:  Procedure Laterality Date  . IR ANGIO VERTEBRAL SEL SUBCLAVIAN INNOMINATE UNI L MOD SED  03/10/2020  . IR INTRAVSC STENT CERV CAROTID W/EMB-PROT MOD SED INCL ANGIO  03/10/2020  . IR STENT PLACEMENT ANTE CAROTID INC ANGIO  03/10/2020      . IR US GUIDE VASC ACCESS RIGHT  03/10/2020  . RADIOLOGY WITH ANESTHESIA N/A 03/10/2020   Procedure: IR WITH ANESTHESIA;  Surgeon: Radiologist, Medication, MD;  Location: [facility] OR;  Service: Radiology;  Laterality: N/A;    There were no vitals filed for this visit.    Subjective Assessment - 03/22/20 0804    Subjective Hospitalized 03/09/20 - presented with concerns of right eye blurry vision.MRI brain with a punctate acute infarcts in the right parieto-occipital region. Still blurry in his right eye, reports it is a little better. Has a follow up with the eye doctor towards the end of the month. Reports balance is feeling off, but not all of the time. Reports walking feels about the same. Not as strong as he used to be.    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    Currently in Pain? Other (Comment)   "just a little around his neck"             [facility] - 03/22/20 0810      Assessment   Medical Diagnosis CVA    Referring Provider (PT) Azucena FallenLancaster, William C, MD    Onset Date/Surgical Date 03/09/20    Hand Dominance Right    Prior Therapy acute PT      Precautions   Precautions Fall      Balance Screen   Has the patient fallen in the past 6 months Yes    How many times? 2, slipping on ice in the winter    Has the patient had a decrease in activity level because of a fear of falling?  No    Is the patient reluctant to leave their home because of a fear of falling?  No      Home Tourist information centre managernvironment   Living Environment Private residence    Living Arrangements Spouse/significant other  Type of Home House    Home Access Stairs to enter    Entrance Stairs-Number of Steps 2    Entrance Stairs-Rails Can reach both    Home Layout One level    Home Equipment Walker - standard;Grab bars - toilet;Grab bars - tub/shower      Prior Function   Level of Independence Independent    Vocation Retired    Leisure Nutritional therapist Intact    Additional Comments reports a little tingling in his L leg (reports he has had it for a while, but has gotten a little worse)      Coordination   Gross Motor Movements are Fluid and Coordinated No    Heel Shin Test slightly slowed B      ROM / Strength   AROM / PROM / Strength Strength      Strength   Strength Assessment Site Hip;Knee;Ankle    Right/Left Hip Left;Right    Right Hip Flexion 4/5    Left Hip Flexion 4-/5    Right/Left Knee Right;Left    Right Knee Flexion 4/5    Right Knee  Extension 4+/5    Left Knee Flexion 4+/5    Left Knee Extension 4+/5    Right/Left Ankle Right;Left    Right Ankle Dorsiflexion 4+/5    Left Ankle Dorsiflexion 4+/5      Transfers   Transfers Stand to Sit;Sit to Stand    Sit to Stand 5: Supervision;With upper extremity assist;From chair/3-in-1    Five time sit to stand comments  33.69 seconds, decr eccentric control    Stand to Sit 5: Supervision;With upper extremity assist;To chair/3-in-1      Ambulation/Gait   Ambulation/Gait Yes    Ambulation/Gait Assistance 5: Supervision    Ambulation/Gait Assistance Details around therapy gym. needing one episode of min A to prevent pt from bumping into BP monitor on pt's R    Assistive device None    Gait Pattern Decreased dorsiflexion - right;Decreased dorsiflexion - left;Poor foot clearance - left;Poor foot clearance - right;Shuffle;Right foot flat;Left foot flat;Decreased trunk rotation    Ambulation Surface Level;Indoor    Gait velocity 17.43 seconds = 1.88 ft/sec    Stairs Yes    Stairs Assistance 5: Supervision;4: Min guard    Stairs Assistance Details (indicate cue type and reason) alternating pattern ascending, step to pattern descending, takes incr time    Stair Management Technique Two rails;Step to pattern;Alternating pattern    Number of Stairs 4    Height of Stairs 6    Gait Comments per pt's wife, pt has had this shuffled/decr foot clearance gait pattern for a couple years      Standardized Balance Assessment   Standardized Balance Assessment Dynamic Gait Index;Timed Up and Go Test      Dynamic Gait Index   Level Surface Mild Impairment    Change in Gait Speed Mild Impairment    Gait with Horizontal Head Turns Mild Impairment    Gait with Vertical Head Turns Moderate Impairment    Gait and Pivot Turn Mild Impairment    Step Over Obstacle Moderate Impairment    Step Around Obstacles Mild Impairment    Steps Moderate Impairment    Total Score 13    DGI comment: 13/24       Timed Up and Go Test   Normal TUG (seconds) 13.85  Objective measurements completed on examination: See above findings.               PT Education - 03/22/20 0915    Education Details clinical findings, POC    Person(s) Educated Patient;Spouse    Methods Explanation    Comprehension Verbalized understanding            PT Short Term Goals - 03/22/20 0917      PT SHORT TERM GOAL #1   Title Pt will be independent with initial HEP in order to build upon functional gains made in therapy. ALL STGS DUE 04/19/20    Time 4    Period Weeks    Status New    Target Date 04/19/20      PT SHORT TERM GOAL #2   Title Pt will improve DGI score to at least a 15/24 in order to demo decr fall risk.    Baseline 13/24    Time 4    Period Weeks    Status New      PT SHORT TERM GOAL #3   Title Pt and pt's spouse will verbalize understanding of fall prevention in the home.    Time 4    Period Weeks    Status New      PT SHORT TERM GOAL #4   Title Pt will improve gait speed to at least 2.2 ft/sec in order to demo improved community mobility.    Baseline 1.88 ft/sec with no AD    Time 4    Period Weeks    Status New      PT SHORT TERM GOAL #5   Title Pt will perform 5x sit <> stand with BUE support in 28 seconds or less in order to demo improved functional BLE strength and decr fall risk.    Baseline 33.69 seconds    Time 4    Period Weeks    Status New             PT Long Term Goals - 03/22/20 0919      PT LONG TERM GOAL #1   Title Pt will be independent with final HEP in order to build upon functional gains made in therapy. ALL LTGS DUE 05/17/20.    Time 8    Period Weeks    Status New    Target Date 05/17/20      PT LONG TERM GOAL #2   Title Pt will improve DGI score to at least a 18/24 in order to demo decr fall risk.    Baseline 13/24    Time 8    Period Weeks    Status New      PT LONG TERM GOAL #3   Title Pt will  ambulate 500' outdoors over unlevel surfaces with supervision with no AD vs. LRAD in order to demo improved community mobility.    Time 8    Period Weeks    Status New      PT LONG TERM GOAL #4   Title Pt will improve gait speed to at least 2.5 ft/sec in order to demo improved community mobility.    Baseline 1.88 ft/sec with no AD    Time 8    Period Weeks    Status New      PT LONG TERM GOAL #5   Title Pt will perform 5x sit <> stand with BUE support in 21 seconds or less in order to demo improved functional BLE strength and decr fall risk.  Baseline 33.69 seconds    Time 8    Period Weeks    Status New                  Plan - 03/22/20 0037    Clinical Impression Statement Patient is a 80 year old male referred to Neuro OPPT s/p CVA (right MCA 3 punctate infarcts, consistent with right ICA stenosis on 03/09/20). Pt still with blurry vision in R eye. 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 The following deficits were present during the exam:  impaired vision, impaired balance, decr activity tolerance, impaired strength, gait abnormalities (shuffled gait pattern with decr B foot clearance), decr coordination. Based on DGI and 5x sit <> stand pt is at a high risk for falls. Pt's gait speed with no AD indicates that pt is a limited community ambulator. 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, water;Locomotion Level    Examination-Participation Restrictions Community Activity;Driving   playing golf   Stability/Clinical Decision Making Stable/Uncomplicated    Clinical Decision Making Low    Rehab Potential Good    PT Frequency 2x / week    PT Duration 8 weeks    PT  Treatment/Interventions ADLs/Self Care Home Management;DME Instruction;Gait training;Therapeutic activities;Functional mobility training;Stair training;Therapeutic exercise;Balance training;Neuromuscular re-education;Patient/family education;Vestibular;Energy conservation    PT Next Visit Plan initial HEP for functional BLE strength and balance. work on Technical brewer and balance/strength and activity tolerance. [employer].  pt with blurry vision in R eye.    Consulted and Agree with Plan of Care Patient;Family member/caregiver    Family Member Consulted pt's wife           Patient will benefit from skilled therapeutic intervention in order to improve the following deficits and impairments:  Abnormal gait,Decreased balance,Decreased activity tolerance,Decreased coordination,Difficulty walking,Decreased strength  Visit Diagnosis: Unsteadiness on feet  Muscle weakness (generalized)  Other abnormalities of gait and mobility  Other symptoms and signs involving the nervous system     Problem List Patient Active Problem List   Diagnosis Date Noted  . Acute ischemic stroke (HCC) 03/10/2020  . 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 Chronic pain 03/09/2020    Drake Leach, PT, DPT  03/22/2020, 9:27 AM  [facility] [address] [place], [place], [address] Phone: [phone]   Fax:  [phone]  Name: Michael Wells MRN: [id number] Date of Birth: Oct 18, 1940

## 2020-03-28 ENCOUNTER — Ambulatory Visit: Payer: Medicare (Managed Care) | Admitting: Occupational Therapy

## 2020-03-28 ENCOUNTER — Other Ambulatory Visit: Payer: Self-pay

## 2020-03-28 ENCOUNTER — Encounter: Payer: Self-pay | Admitting: Physical Therapy

## 2020-03-28 ENCOUNTER — Encounter: Payer: Self-pay | Admitting: Occupational Therapy

## 2020-03-28 ENCOUNTER — Ambulatory Visit: Payer: Medicare (Managed Care) | Admitting: Physical Therapy

## 2020-03-28 DIAGNOSIS — R29818 Other symptoms and signs involving the nervous system: Secondary | ICD-10-CM

## 2020-03-28 DIAGNOSIS — R2681 Unsteadiness on feet: Secondary | ICD-10-CM | POA: Diagnosis not present

## 2020-03-28 DIAGNOSIS — M6281 Muscle weakness (generalized): Secondary | ICD-10-CM

## 2020-03-28 DIAGNOSIS — R41844 Frontal lobe and executive function deficit: Secondary | ICD-10-CM

## 2020-03-28 DIAGNOSIS — R2689 Other abnormalities of gait and mobility: Secondary | ICD-10-CM

## 2020-03-28 DIAGNOSIS — R41840 Attention and concentration deficit: Secondary | ICD-10-CM

## 2020-03-28 DIAGNOSIS — R41842 Visuospatial deficit: Secondary | ICD-10-CM

## 2020-03-28 NOTE — Therapy (Signed)
[facility] [address] [place], [place], [address] Phone: [phone]   Fax:  [phone]  Physical Therapy Treatment  Patient Details  Name: Michael Wells MRN: [id number] Date of Birth: 05/20/1940 Referring Provider (PT): Azucena Fallen, MD   Encounter Date: 03/28/2020   PT End of Session - 03/28/20 0759    Visit Number 2    Number of Visits 17    Date for PT Re-Evaluation Jul 16, 2020   written for 60 day POC   Authorization Type [employer]    PT Start Time 0759    PT Stop Time 0841    PT Time Calculation (min) 42 min    Equipment Utilized During Treatment --    Activity Tolerance Patient tolerated treatment well    Behavior During Therapy [facility] for tasks assessed/performed           Past Medical History:  Diagnosis Date  . Anxiety   . Chronic pain   . H/O sleep disturbance     Past Surgical History:  Procedure Laterality Date  . IR ANGIO VERTEBRAL SEL SUBCLAVIAN INNOMINATE UNI L MOD SED  03/10/2020  . IR INTRAVSC STENT CERV CAROTID W/EMB-PROT MOD SED INCL ANGIO  03/10/2020  . IR STENT PLACEMENT ANTE CAROTID INC ANGIO  03/10/2020      . IR US GUIDE VASC ACCESS RIGHT  03/10/2020  . RADIOLOGY WITH ANESTHESIA N/A 03/10/2020   Procedure: IR WITH ANESTHESIA;  Surgeon: Radiologist, Medication, MD;  Location: [facility] OR;  Service: Radiology;  Laterality: N/A;    There were no vitals filed for this visit.   Subjective Assessment - 03/28/20 0758    Subjective No new complaints. No falls or pain to report.    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    Currently in Pain? No/denies    Pain Score 0-No pain               [facility] Adult PT Treatment/Exercise - 03/28/20 0800       Transfers   Transfers Stand to Sit;Sit to Stand    Sit to Stand 5: Supervision;With upper extremity assist;From chair/3-in-1    Stand to Sit 5: Supervision;With upper extremity assist;To chair/3-in-1      Ambulation/Gait   Ambulation/Gait Yes    Ambulation/Gait Assistance 5: Supervision    Ambulation/Gait Assistance Details 80   x2, plus around gym with session   Assistive device None    Gait Pattern Decreased dorsiflexion - right;Decreased dorsiflexion - left;Poor foot clearance - left;Poor foot clearance - right;Shuffle;Right foot flat;Left foot flat;Decreased trunk rotation    Ambulation Surface Level;Indoor      Neuro Re-ed    Neuro Re-ed Details  for balance/muscle re-ed: standing with single UE support on sturdy surface- alternating forward/backward stepping over red foam beam for 10 reps each side. min guard assist for balance with cues for increased step length/height to clear beam surface.      Exercises   Exercises Other Exercises    Other Exercises  issued ex's to HEP today to address strengthening and balance. Refer to [employer] for full details.      Knee/Hip Exercises: Aerobic   Other Aerobic Scifit UE/LE's level 2.0 x 8 minutes with goal >/=30 rpm for strengthening, activity tolerance  and to work on Wellsite geologist. Pt needed cues to to keep moving and for "big" movements to go through full range of motion.                  PT Education - 03/28/20 6286    Education Details HEP for strengthening and balance    Person(s) Educated Patient    Methods Explanation;Demonstration;Verbal cues;Handout    Comprehension Verbalized understanding;Returned demonstration;Verbal cues required;Need further instruction            PT Short Term Goals - 03/22/20 0917      PT SHORT TERM GOAL #1   Title Pt will be independent with initial HEP in order to build upon functional gains made in therapy. ALL STGS DUE 04/19/20    Time 4    Period Weeks    Status New    Target  Date 04/19/20      PT SHORT TERM GOAL #2   Title Pt will improve DGI score to at least a 15/24 in order to demo decr fall risk.    Baseline 13/24    Time 4    Period Weeks    Status New      PT SHORT TERM GOAL #3   Title Pt and pt's spouse will verbalize understanding of fall prevention in the home.    Time 4    Period Weeks    Status New      PT SHORT TERM GOAL #4   Title Pt will improve gait speed to at least 2.2 ft/sec in order to demo improved community mobility.    Baseline 1.88 ft/sec with no AD    Time 4    Period Weeks    Status New      PT SHORT TERM GOAL #5   Title Pt will perform 5x sit <> stand with BUE support in 28 seconds or less in order to demo improved functional BLE strength and decr fall risk.    Baseline 33.69 seconds    Time 4    Period Weeks    Status New             PT Long Term Goals - 03/22/20 0919      PT LONG TERM GOAL #1   Title Pt will be independent with final HEP in order to build upon functional gains made in therapy. ALL LTGS DUE 05/17/20.    Time 8    Period Weeks    Status New    Target Date 05/17/20      PT LONG TERM GOAL #2   Title Pt will improve DGI score to at least a 18/24 in order to demo decr fall risk.    Baseline 13/24    Time 8    Period Weeks    Status New      PT LONG TERM GOAL #3   Title Pt will ambulate 500' outdoors over unlevel surfaces with supervision with no AD vs. LRAD in order to demo improved community mobility.    Time 8    Period Weeks    Status New      PT LONG TERM GOAL #4   Title Pt will improve gait speed to at least 2.5 ft/sec in order to demo improved community mobility.    Baseline 1.88 ft/sec with no AD    Time 8    Period Weeks    Status New      PT LONG TERM GOAL #5   Title  Pt will perform 5x sit <> stand with BUE support in 21 seconds or less in order to demo improved functional BLE strength and decr fall risk.    Baseline 33.69 seconds    Time 8    Period Weeks    Status New                  Plan - 03/28/20 0759    Clinical Impression Statement Today's skilled session focused initially on establishment of an HEP for strengthening and balance. No issues reported or noted with performance in session today. Remainder of session continued to focus on strengthening and steppiing strategies with emphasis on increased step length/height as pt shuffles feet with gait. No carryover noted with gait out of session, will need continued work on this. The pt is progressing toward goals and should benefit from continued PT to progress toward unmet goals.    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 work on Technical brewer and balance/strength and activity tolerance. [employer].  pt with blurry vision in R eye.    Consulted and Agree with Plan of Care Patient;Family member/caregiver    Family Member Consulted pt's wife           Patient will benefit from skilled therapeutic intervention in order to improve the following deficits and impairments:  Abnormal gait,Decreased balance,Decreased activity tolerance,Decreased coordination,Difficulty walking,Decreased strength  Visit Diagnosis: Unsteadiness on feet  Muscle weakness (generalized)  Other symptoms and signs involving the nervous  system  Other abnormalities of gait and mobility     Problem List Patient Active Problem List   Diagnosis Date Noted  . Acute ischemic stroke (HCC) 03/10/2020  . 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 Chronic pain 03/09/2020    Sallyanne Kuster, PTA, [facility] Outpatient Neuro [facility] [address] [place], [place] [address] [phone] 03/28/20, 8:46 AM   Name: SUZANNE GARBERS MRN: [id number] Date of Birth: 09-Aug-1940

## 2020-03-28 NOTE — Therapy (Signed)
[facility] [address] [place], [place], [address] Phone: [phone]   Fax:  [phone]  Occupational Therapy Treatment  Patient Details  Name: Michael Wells MRN: [id number] Date of Birth: 1940/04/04 Referring Provider (OT): Dr. Natale Milch (will send to PCP as pt referred by hospitalist)   Encounter Date: 03/28/2020   OT End of Session - 03/28/20 0749    Visit Number 2    Number of Visits 25    Date for OT Re-Evaluation 06/14/20    Authorization Type [employer], out of network    Authorization Time Period 90 days-06/14/20    Authorization - Visit Number 2    Authorization - Number of Visits 10    OT Start Time [id number]    OT Stop Time 0757    OT Time Calculation (min) 40 min    Activity Tolerance Patient tolerated treatment well    Behavior During Therapy [facility] for tasks assessed/performed           Past Medical History:  Diagnosis Date  . Anxiety   . Chronic pain   . H/O sleep disturbance     Past Surgical History:  Procedure Laterality Date  . IR ANGIO VERTEBRAL SEL SUBCLAVIAN INNOMINATE UNI L MOD SED  03/10/2020  . IR INTRAVSC STENT CERV CAROTID W/EMB-PROT MOD SED INCL ANGIO  03/10/2020  . IR STENT PLACEMENT ANTE CAROTID INC ANGIO  03/10/2020      . IR US GUIDE VASC ACCESS RIGHT  03/10/2020  . RADIOLOGY WITH ANESTHESIA N/A 03/10/2020   Procedure: IR WITH ANESTHESIA;  Surgeon: Radiologist, Medication, MD;  Location: [facility] OR;  Service: Radiology;  Laterality: N/A;    There were no vitals filed for this visit.   Subjective Assessment - 03/28/20 0752    Subjective  Denies pain    Pertinent History 80 y.o. 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    Patient Stated Goals improve balance and vision    Currently in Pain? No/denies                   Treatment:Pt was noted to graze doorway on right side when walking into gym.  Tabletop scanning for number cancellation 25M 100% accuracy, increased time required grossly 20 mins Environmental scanning: required 4 passes, 7 located first pass, 3 second pass, 4 third pass and 1 for final pass, min v.c for scanning to both sides, high and low. Pt requires increased time and does not demonstrate effective scanning. 12 piece puzzle with increased time and mod v.c for organization.               OT Short Term Goals - 03/22/20 1238      OT SHORT TERM GOAL #1   Title I with inital HEP    Time 4    Period Weeks    Status New    Target Date 04/19/20      OT SHORT TERM GOAL #2   Title Pt will verbalize understanding of compensatory strategeis for visual deficits    Time 4    Period Weeks    Status New      OT SHORT TERM GOAL #3   Title Pt/ wife will verbalize understanding of compensatory strategeis for cognitve/memory deficits.    Time 4    Period Weeks  Status New      OT SHORT TERM GOAL #4   Title Pt will perform tabletop scanning activities with  80% or better accuracy.    Time 4    Period Weeks    Status New      OT SHORT TERM GOAL #5   Title Pt will navigate a min distracting environment and locate items with 75% or better accuracy.    Time 4    Period Weeks    Status New      Additional Short Term Goals   Additional Short Term Goals Yes      OT SHORT TERM GOAL #6   Title Pt will demonstrate ability to sequence a simple functional task with 90% or better accuracy in a reasonable amount of time    Baseline delayed processing    Time 4    Period Weeks    Status New             OT Long Term Goals - 03/22/20 1241      OT LONG TERM GOAL #1   Title I with updated HEP.    Time 12    Period Weeks    Status New    Target Date 06/14/20      OT LONG TERM GOAL #2   Title Pt will perfom environmental scanning activities in a mod  distracting environment with 90% better accuracy.    Time 12    Period Weeks      OT LONG TERM GOAL #3   Title Pt will perfrom shower transfer and shower, at a modified independent level    Baseline pt is currently sponge bathing.    Time 12    Period Weeks    Status New      OT LONG TERM GOAL #4   Title Pt will retrieve 3 lbs fom an overhead shelf with RUE with pain no greater than 2/10    Time 12    Period Weeks    Status New      OT LONG TERM GOAL #5   Title Test 9 hole peg test and set goal prn    Time 12    Period Weeks    Status New      Long Term Additional Goals   Additional Long Term Goals Yes      OT LONG TERM GOAL #6   Title I with all basic ADLs.    Time 12    Period Weeks    Status New                 Plan - 03/28/20 0750    Clinical Impression Statement Pt is progressing towards goals for basic tabletop scanning. He demonstrates significant difficulty with environmental scanning.    OT Occupational Profile and History Detailed Assessment- Review of Records and additional review of physical, cognitive, psychosocial history related to current functional performance    Occupational performance deficits (Please refer to evaluation for details): ADL's;IADL's;Leisure;Social Participation    Body Structure / Function / Physical Skills ADL;UE functional use;Balance;Pain;Gait;ROM;GMC;Coordination;Decreased knowledge of precautions;IADL;Dexterity;Mobility    Cognitive Skills Attention;Learn;Memory;Perception;Problem Solve;Safety Awareness;Sequencing;Thought;Understand    Rehab Potential Good    Clinical Decision Making Limited treatment options, no task modification necessary    Comorbidities Affecting Occupational Performance: May have comorbidities impacting occupational performance    Modification or Assistance to Complete Evaluation  No modification of tasks or assist necessary to complete eval    OT Frequency 2x / week  plus eval, anticipate d/c after 8  weeks   OT Duration 12 weeks    OT Treatment/Interventions Self-care/ADL training;Ultrasound;Energy conservation;Visual/perceptual remediation/compensation;Patient/family education;DME and/or AE instruction;Paraffin;Gait Training;Passive range of motion;Balance training;Fluidtherapy;Cryotherapy;Building services engineer;Therapeutic activities;Manual Therapy;Therapeutic exercise;Moist Heat;Neuromuscular education;Cognitive remediation/compensation    Plan ,vision compensations, test 9 hole peg test and set goal prn    Consulted and Agree with Plan of Care Patient    Family Member Consulted wife           Patient will benefit from skilled therapeutic intervention in order to improve the following deficits and impairments:   Body Structure / Function / Physical Skills: ADL,UE functional use,Balance,Pain,Gait,ROM,GMC,Coordination,Decreased knowledge of precautions,IADL,Dexterity,Mobility Cognitive Skills: Attention,Learn,Memory,Perception,Problem Solve,Safety Awareness,Sequencing,Thought,Understand     Visit Diagnosis: Muscle weakness (generalized)  Other symptoms and signs involving the nervous system  Visuospatial deficit  Attention and concentration deficit  Frontal lobe and executive function deficit    Problem List Patient Active Problem List   Diagnosis Date Noted  . Acute ischemic stroke (HCC) 03/10/2020  . 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 Chronic pain 03/09/2020    Elaf Clauson 03/28/2020, 7:53 AM  [facility] [address] [place], [place], [address] Phone: [phone]   Fax:  [phone]  Name: GRAHM ETSITTY MRN: [id number] Date of Birth: [birth year]

## 2020-03-28 NOTE — Patient Instructions (Signed)
Access Code: [facility] URL: [email or web address] Date: 03/28/2020 Prepared by: Sallyanne Kuster  Exercises Sit to Stand with Counter Support - 1 x daily - 5 x weekly - 1 sets - 10 reps Seated Knee Extension with Resistance - 1 x daily - 5 x weekly - 1 sets - 10 reps Seated Hip Abduction with Resistance - 1 x daily - 5 x weekly - 1 sets - 10 reps Heel Toe Raises with Counter Support - 1 x daily - 5 x weekly - 1 sets - 10 reps Standing Hip Abduction with Counter Support - 1 x daily - 5 x weekly - 1 sets - 10 reps

## 2020-03-29 ENCOUNTER — Ambulatory Visit: Payer: Medicare (Managed Care) | Admitting: Occupational Therapy

## 2020-03-29 ENCOUNTER — Ambulatory Visit: Payer: Medicare (Managed Care)

## 2020-03-29 DIAGNOSIS — R41844 Frontal lobe and executive function deficit: Secondary | ICD-10-CM

## 2020-03-29 DIAGNOSIS — R41842 Visuospatial deficit: Secondary | ICD-10-CM

## 2020-03-29 DIAGNOSIS — R29818 Other symptoms and signs involving the nervous system: Secondary | ICD-10-CM

## 2020-03-29 DIAGNOSIS — M6281 Muscle weakness (generalized): Secondary | ICD-10-CM

## 2020-03-29 DIAGNOSIS — R2681 Unsteadiness on feet: Secondary | ICD-10-CM

## 2020-03-29 DIAGNOSIS — R2689 Other abnormalities of gait and mobility: Secondary | ICD-10-CM

## 2020-03-29 DIAGNOSIS — R41840 Attention and concentration deficit: Secondary | ICD-10-CM

## 2020-03-29 NOTE — Patient Instructions (Signed)
1. Look for the edge of objects (to the left and/or right) so that you make sure you are seeing all of an object 2. Turn your head when walking, scan from side to side, particularly in busy environments 3. Use an organized scanning pattern. It's usually easier to scan from top to bottom, and left to right (like you are reading) 4. Double check yourself 5. Use a line guide (like a blank piece of paper) or your finger when reading   Activities to try at home to encourage visual scanning:   1. Word searches 2. Mazes 3. Puzzles 4. Card games 5. Computer games and/or searches 6. Connect-the-dots  Activities for environmental (larger) scanning:  1. With supervision, scan for items in grocery store or drugstore.  Begin with a familiar store, then progress to a new store you've never been in before. Make sure you have supervision with this. Make sure you have someone with you whenever you are crossing the street.

## 2020-03-29 NOTE — Therapy (Addendum)
[facility] [address] [place], [place], [address] Phone: [phone]   Fax:  [phone]  Occupational Therapy Treatment  Patient Details  Name: Michael Wells MRN: [id number] Date of Birth: 02-22-1940 Referring Provider (OT): Dr. Natale Milch (will send to PCP as pt referred by hospitalist)   Encounter Date: 03/29/2020   OT End of Session - 03/29/20 0806    Visit Number 3    Number of Visits 25    Date for OT Re-Evaluation 06/14/20    Authorization Type [employer], out of network    Authorization Time Period 90 days-06/14/20    Authorization - Visit Number 3    Authorization - Number of Visits 10    OT Start Time 0805    OT Stop Time 0845    OT Time Calculation (min) 40 min    Activity Tolerance Patient tolerated treatment well    Behavior During Therapy [facility] for tasks assessed/performed           Past Medical History:  Diagnosis Date  . Anxiety   . Chronic pain   . H/O sleep disturbance     Past Surgical History:  Procedure Laterality Date  . IR ANGIO VERTEBRAL SEL SUBCLAVIAN INNOMINATE UNI L MOD SED  03/10/2020  . IR INTRAVSC STENT CERV CAROTID W/EMB-PROT MOD SED INCL ANGIO  03/10/2020  . IR STENT PLACEMENT ANTE CAROTID INC ANGIO  03/10/2020      . IR US GUIDE VASC ACCESS RIGHT  03/10/2020  . RADIOLOGY WITH ANESTHESIA N/A 03/10/2020   Procedure: IR WITH ANESTHESIA;  Surgeon: Radiologist, Medication, MD;  Location: [facility] OR;  Service: Radiology;  Laterality: N/A;    There were no vitals filed for this visit.   Subjective Assessment - 03/29/20 0807    Pertinent History 80 y.o. 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    Patient Stated Goals improve balance and vision    Currently in Pain? No/denies                 Treatment: completing a 12 piece puzzle for improved scanning and problems solving, mod difficulty/ v.c for organization. Sequencing steps for functional task on constant therapy, 56% accuracy, 107.56 response time, mod v.c  Checked 9 hole peg test and updated LTG.                OT Education - 03/29/20 1101    Education Details visual compensation strategies, recommendation that pt does not shower/ transfer to shower without assitance, pt's wife was not present today to discuss.    Person(s) Educated Patient    Methods Explanation;Verbal cues;Handout    Comprehension Verbalized understanding            OT Short Term Goals - 03/29/20 0821      OT SHORT TERM GOAL #1   Title I with inital HEP    Time 4    Period Weeks    Status New    Target Date 04/19/20      OT SHORT TERM GOAL #2   Title Pt will verbalize understanding of compensatory strategeis for visual deficits    Time 4    Period Weeks    Status New      OT SHORT TERM GOAL #3   Title Pt/ wife will verbalize understanding of compensatory strategeis for  cognitve/memory deficits.    Time 4    Period Weeks    Status New      OT SHORT TERM GOAL #4   Title Pt will perform tabletop scanning activities with  80% or better accuracy.    Time 4    Period Weeks    Status New      OT SHORT TERM GOAL #5   Title Pt will navigate a min distracting environment and locate items with 75% or better accuracy.    Time 4    Period Weeks    Status New      OT SHORT TERM GOAL #6   Title Pt will demonstrate ability to sequence a simple functional task with 90% or better accuracy in a reasonable amount of time    Baseline delayed processing    Time 4    Period Weeks    Status New             OT Long Term Goals - 03/22/20 1241      OT LONG TERM GOAL #1   Title I with updated HEP.    Time 12    Period Weeks    Status New    Target Date 06/14/20      OT LONG TERM GOAL #2   Title Pt will perfom  environmental scanning activities in a mod distracting environment with 90% better accuracy.    Time 12    Period Weeks      OT LONG TERM GOAL #3   Title Pt will perfrom shower transfer and shower, at a modified independent level    Baseline pt is currently sponge bathing.    Time 12    Period Weeks    Status New      OT LONG TERM GOAL #4   Title Pt will retrieve 3 lbs fom an overhead shelf with RUE with pain no greater than 2/10    Time 12    Period Weeks    Status New      OT LONG TERM GOAL #5   Title Pt will demonstrate improved LUE fine motor coordiantion as evidenced by decreasing LUE 9 hole peg test score to 60 secs or less.   Time 12    Period Weeks    Status New      Long Term Additional Goals   Additional Long Term Goals Yes      OT LONG TERM GOAL #6   Title I with all basic ADLs.    Time 12    Period Weeks    Status New                 Plan - 03/29/20 0826    Clinical Impression Statement Pt is progressing towards goals however he remains limited by visual and cognitive deficits.    OT Occupational Profile and History Detailed Assessment- Review of Records and additional review of physical, cognitive, psychosocial history related to current functional performance    Occupational performance deficits (Please refer to evaluation for details): ADL's;IADL's;Leisure;Social Participation    Body Structure / Function / Physical Skills ADL;UE functional use;Balance;Pain;Gait;ROM;GMC;Coordination;Decreased knowledge of precautions;IADL;Dexterity;Mobility    Cognitive Skills Attention;Learn;Memory;Perception;Problem Solve;Safety Awareness;Sequencing;Thought;Understand    Rehab Potential Good    Clinical Decision Making Limited treatment options, no task modification necessary    Comorbidities Affecting Occupational Performance: May have comorbidities impacting occupational performance    Modification or Assistance to Complete Evaluation  No modification of tasks or  assist necessary to complete  eval    OT Frequency 2x / week   plus eval, anticipate d/c after 8 weeks   OT Duration 12 weeks    OT Treatment/Interventions Self-care/ADL training;Ultrasound;Energy conservation;Visual/perceptual remediation/compensation;Patient/family education;DME and/or AE instruction;Paraffin;Gait Training;Passive range of motion;Balance training;Fluidtherapy;Cryotherapy;Building services engineer;Therapeutic activities;Manual Therapy;Therapeutic exercise;Moist Heat;Neuromuscular education;Cognitive remediation/compensation    Plan discuss shower safety recommendations with pt/ wife, HEP for LUE coordination, proximal strength/ ROM, consider HEP for vision    Consulted and Agree with Plan of Care Patient    Family Member Consulted wife           Patient will benefit from skilled therapeutic intervention in order to improve the following deficits and impairments:   Body Structure / Function / Physical Skills: ADL,UE functional use,Balance,Pain,Gait,ROM,GMC,Coordination,Decreased knowledge of precautions,IADL,Dexterity,Mobility Cognitive Skills: Attention,Learn,Memory,Perception,Problem Solve,Safety Awareness,Sequencing,Thought,Understand     Visit Diagnosis: Muscle weakness (generalized)  Other symptoms and signs involving the nervous system  Visuospatial deficit  Attention and concentration deficit  Frontal lobe and executive function deficit  Unsteadiness on feet    Problem List Patient Active Problem List   Diagnosis Date Noted  . Acute ischemic stroke (HCC) 03/10/2020  . 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 Chronic pain 03/09/2020    Reinette Cuneo 03/29/2020, 12:03 PM Keene Breath, OTR/L [phone] Phone: [phone] 12:08 PM 03/29/20 [facility] [address] [place], [place], [address] Phone: [phone]   Fax:  [phone]  Name: Michael Wells MRN: [id number] Date of  Birth: May 03, 1940

## 2020-03-29 NOTE — Therapy (Signed)
[facility] [address] [place], [place], [address] Phone: [phone]   Fax:  [phone]  Physical Therapy Treatment  Patient Details  Name: Michael Wells MRN: [id number] Date of Birth: [birth year] Referring Provider (PT): Azucena Fallen, MD   Encounter Date: 03/29/2020   PT End of Session - 03/29/20 0851    Visit Number 3    Number of Visits 17    Date for PT Re-Evaluation 2020/07/15   written for 60 day POC   Authorization Type [employer]    PT Start Time 0845    PT Stop Time 0930    PT Time Calculation (min) 45 min    Equipment Utilized During Treatment Gait belt    Activity Tolerance Patient tolerated treatment well    Behavior During Therapy [facility] for tasks assessed/performed           Past Medical History:  Diagnosis Date  . Anxiety   . Chronic pain   . H/O sleep disturbance     Past Surgical History:  Procedure Laterality Date  . IR ANGIO VERTEBRAL SEL SUBCLAVIAN INNOMINATE UNI L MOD SED  03/10/2020  . IR INTRAVSC STENT CERV CAROTID W/EMB-PROT MOD SED INCL ANGIO  03/10/2020  . IR STENT PLACEMENT ANTE CAROTID INC ANGIO  03/10/2020      . IR US GUIDE VASC ACCESS RIGHT  03/10/2020  . RADIOLOGY WITH ANESTHESIA N/A 03/10/2020   Procedure: IR WITH ANESTHESIA;  Surgeon: Radiologist, Medication, MD;  Location: [facility] OR;  Service: Radiology;  Laterality: N/A;    There were no vitals filed for this visit.   Subjective Assessment - 03/29/20 0852    Subjective No new falls. Wife not present during the session.    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    Currently in Pain? No/denies              Scifit: UE and LE, manual level 10, 8' Standing calf stretch on 1/2 foam  roll: 5 x 20" Stading on airex: Wide BOS: standing with EO: 1', horizontal head turns: 10x, vertical head turns: 10x no HHA, min A due to posterior LOB FWD step ups: 8" box: bil HHA: 10x R and L Seated hip flexion: 8lbs on each ankle: 2 x 5 with 5 sec hold at top Seated LAQ: 8lbs on each ankle 2 x 5 R and L Standing heel raises: 8lbs on each ankle: 20x Sit to stand from mat table: 15 lbs kettle ball in hand: 10x                          PT Short Term Goals - 03/22/20 0917      PT SHORT TERM GOAL #1   Title Pt will be independent with initial HEP in order to build upon functional gains made in therapy. ALL STGS DUE 04/19/20    Time 4    Period Weeks    Status New    Target Date 04/19/20      PT SHORT TERM GOAL #2   Title Pt will improve DGI score to at least a 15/24 in order to demo decr fall risk.    Baseline 13/24    Time 4  Period Weeks    Status New      PT SHORT TERM GOAL #3   Title Pt and pt's spouse will verbalize understanding of fall prevention in the home.    Time 4    Period Weeks    Status New      PT SHORT TERM GOAL #4   Title Pt will improve gait speed to at least 2.2 ft/sec in order to demo improved community mobility.    Baseline 1.88 ft/sec with no AD    Time 4    Period Weeks    Status New      PT SHORT TERM GOAL #5   Title Pt will perform 5x sit <> stand with BUE support in 28 seconds or less in order to demo improved functional BLE strength and decr fall risk.    Baseline 33.69 seconds    Time 4    Period Weeks    Status New             PT Long Term Goals - 03/22/20 0919      PT LONG TERM GOAL #1   Title Pt will be independent with final HEP in order to build upon functional gains made in therapy. ALL LTGS DUE 05/17/20.    Time 8    Period Weeks    Status New    Target Date 05/17/20      PT LONG TERM GOAL #2   Title Pt will improve DGI score to at least a 18/24 in order to demo decr fall risk.    Baseline 13/24     Time 8    Period Weeks    Status New      PT LONG TERM GOAL #3   Title Pt will ambulate 500' outdoors over unlevel surfaces with supervision with no AD vs. LRAD in order to demo improved community mobility.    Time 8    Period Weeks    Status New      PT LONG TERM GOAL #4   Title Pt will improve gait speed to at least 2.5 ft/sec in order to demo improved community mobility.    Baseline 1.88 ft/sec with no AD    Time 8    Period Weeks    Status New      PT LONG TERM GOAL #5   Title Pt will perform 5x sit <> stand with BUE support in 21 seconds or less in order to demo improved functional BLE strength and decr fall risk.    Baseline 33.69 seconds    Time 8    Period Weeks    Status New                 Plan - 03/29/20 9470    Clinical Impression Statement Pt has significant weakness in bil hip flexors which is also impacting his ability to clear his feet when walking. Pt needs max cueing during session to stay on tasks for couting reps and to maintain proper form. Pt seems distracted as he kept looking at his watch to find out when therapy would end. Pt able to pick up legs better at end of the session and his cadence improved as well.    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 work on Actor with calf stretching/strengthening and hip flexor strengthening. Uneven surface balance-static and dynamic- pt has posterior LOB    Consulted and Agree with Plan of Care Patient;Family member/caregiver    Family Member Consulted pt's wife           Patient will benefit from skilled therapeutic intervention in order to improve the following deficits and impairments:  Abnormal gait,Decreased balance,Decreased activity tolerance,Decreased coordination,Difficulty walking,Decreased strength  Visit Diagnosis: Muscle weakness (generalized)  Other symptoms and signs involving the nervous system  Unsteadiness on feet  Other abnormalities of gait and mobility     Problem List Patient Active Problem List   Diagnosis Date Noted  . Acute ischemic stroke (HCC) 03/10/2020  . 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 Chronic pain 03/09/2020    Ileana Ladd, PT 03/29/2020, 9:35 AM  [facility] [address] [place], [place], [address] Phone: [phone]   Fax:  [phone]  Name: Michael Wells MRN: [id number] Date of Birth: 28-Nov-1940

## 2020-04-03 ENCOUNTER — Other Ambulatory Visit: Payer: Self-pay | Admitting: *Deleted

## 2020-04-03 NOTE — Patient Outreach (Signed)
[facility]) Care Management  04/03/2020  HALBERT JESSON 10/16/1940 [id number]   Call placed to member/wife to follow up on stroke recovery.  She state he is "doing pretty good."  He has started outpatient PT/OT, doing well with the sessions.  He still does not have follow up with neurologist yet, she will follow up with PCP as she was supposed to be sending referral to one that was in [employer].  Wife aware that member should follow up with IR within 3 months of discharge but no appointment scheduled yet.  Provided with contact information per discharge instructions.  Advised to call, verbalizes understanding and state she will call today.  Denies any further questions, will close case at this time.  Encouraged to contact this care manager with any concerns.  Kemper Durie, [place], MSN [facility] Care Management  [facility] Manager [phone]

## 2020-04-04 ENCOUNTER — Ambulatory Visit: Payer: Medicare (Managed Care) | Admitting: Physical Therapy

## 2020-04-04 ENCOUNTER — Other Ambulatory Visit: Payer: Self-pay

## 2020-04-04 ENCOUNTER — Ambulatory Visit: Payer: Medicare (Managed Care) | Admitting: Occupational Therapy

## 2020-04-04 ENCOUNTER — Encounter: Payer: Self-pay | Admitting: Physical Therapy

## 2020-04-04 DIAGNOSIS — R41842 Visuospatial deficit: Secondary | ICD-10-CM

## 2020-04-04 DIAGNOSIS — R29818 Other symptoms and signs involving the nervous system: Secondary | ICD-10-CM

## 2020-04-04 DIAGNOSIS — R2689 Other abnormalities of gait and mobility: Secondary | ICD-10-CM

## 2020-04-04 DIAGNOSIS — R2681 Unsteadiness on feet: Secondary | ICD-10-CM

## 2020-04-04 DIAGNOSIS — M6281 Muscle weakness (generalized): Secondary | ICD-10-CM

## 2020-04-04 DIAGNOSIS — R41840 Attention and concentration deficit: Secondary | ICD-10-CM

## 2020-04-04 DIAGNOSIS — R41844 Frontal lobe and executive function deficit: Secondary | ICD-10-CM

## 2020-04-04 NOTE — Therapy (Signed)
[facility] [address] [place], [place], [address] Phone: [phone]   Fax:  [phone]  Occupational Therapy Treatment  Patient Details  Name: Michael Wells MRN: [id number] Date of Birth: December 14, 1940 Referring Provider (OT): Dr. Natale Milch (will send to PCP as pt referred by hospitalist)   Encounter Date: 04/04/2020   OT End of Session - 04/04/20 0813    Visit Number 4    Number of Visits 25    Date for OT Re-Evaluation 06/14/20    Authorization Type [employer], out of network    Authorization Time Period 90 days-06/14/20    Authorization - Visit Number 4    Authorization - Number of Visits 10    OT Start Time 0800    OT Stop Time 0840    OT Time Calculation (min) 40 min    Activity Tolerance Patient tolerated treatment well    Behavior During Therapy [facility] for tasks assessed/performed           Past Medical History:  Diagnosis Date  . Anxiety   . Chronic pain   . H/O sleep disturbance     Past Surgical History:  Procedure Laterality Date  . IR ANGIO VERTEBRAL SEL SUBCLAVIAN INNOMINATE UNI L MOD SED  03/10/2020  . IR INTRAVSC STENT CERV CAROTID W/EMB-PROT MOD SED INCL ANGIO  03/10/2020  . IR STENT PLACEMENT ANTE CAROTID INC ANGIO  03/10/2020      . IR US GUIDE VASC ACCESS RIGHT  03/10/2020  . RADIOLOGY WITH ANESTHESIA N/A 03/10/2020   Procedure: IR WITH ANESTHESIA;  Surgeon: Radiologist, Medication, MD;  Location: [facility] OR;  Service: Radiology;  Laterality: N/A;    There were no vitals filed for this visit.   Subjective Assessment - 04/04/20 0813    Subjective  Denies pain    Pertinent History 80 y.o. 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    Patient Stated Goals improve balance and vision    Currently in Pain? No/denies                    Treatment: Arm bike x 5 mins level 1 for conditioning Pt's wife reprots pt is not showering, he continues to sponge bathe, he has confusion at times.         OTTreatment/ Education - 04/04/20 1217    Education Details beginning vision HEP-pt/wife were instructed, pt's wife to assist, coordination HEP for LUE. Pt required mod-max v.c. and demonstration, pt remains easily distracted   Person(s) Educated Patient;Spouse    Methods Explanation;Verbal cues;Handout;Demonstration    Comprehension Verbalized understanding;Returned demonstration;Verbal cues required;Need further instruction            OT Short Term Goals - 03/29/20 0102      OT SHORT TERM GOAL #1   Title I with inital HEP    Time 4    Period Weeks    Status New    Target Date 04/19/20      OT SHORT TERM GOAL #2   Title Pt will verbalize understanding of compensatory strategeis for visual deficits    Time 4    Period Weeks    Status New      OT SHORT TERM GOAL #3   Title Pt/ wife will verbalize understanding of compensatory strategeis for cognitve/memory deficits.    Time  4    Period Weeks    Status New      OT SHORT TERM GOAL #4   Title Pt will perform tabletop scanning activities with  80% or better accuracy.    Time 4    Period Weeks    Status New      OT SHORT TERM GOAL #5   Title Pt will navigate a min distracting environment and locate items with 75% or better accuracy.    Time 4    Period Weeks    Status New      OT SHORT TERM GOAL #6   Title Pt will demonstrate ability to sequence a simple functional task with 90% or better accuracy in a reasonable amount of time    Baseline delayed processing    Time 4    Period Weeks    Status New             OT Long Term Goals - 03/29/20 1206      OT LONG TERM GOAL #1   Title I with updated HEP.    Time 12    Period Weeks    Status New      OT LONG TERM GOAL #2   Title Pt will perfom environmental scanning activities  in a mod distracting environment with 90% better accuracy.    Time 12    Period Weeks      OT LONG TERM GOAL #3   Title Pt will perfrom shower transfer and shower, at a modified independent level    Baseline pt is currently sponge bathing.    Time 12    Period Weeks    Status New      OT LONG TERM GOAL #4   Title Pt will retrieve 3 lbs fom an overhead shelf with RUE with pain no greater than 2/10    Time 12    Period Weeks    Status New      OT LONG TERM GOAL #5   Title Pt will demonstrate improved LUE fine motor coordiantion as evidenced by decreasing LUE 9 hole peg test score to 60 secs or less.    Baseline RUE 43, LUE 1 min 17 secs    Time 12    Period Weeks    Status New      OT LONG TERM GOAL #6   Title I with all basic ADLs.    Time 12    Period Weeks    Status New                 Plan - 04/04/20 1962    Clinical Impression Statement Pt is progressing towards goals slowly. Pt's cognitve deficits are a limiting factor. Therapist has requested pt has his wife stay for therapy in some future visits to perfrom some pt/ family education.    OT Occupational Profile and History Detailed Assessment- Review of Records and additional review of physical, cognitive, psychosocial history related to current functional performance    Occupational performance deficits (Please refer to evaluation for details): ADL's;IADL's;Leisure;Social Participation    Body Structure / Function / Physical Skills ADL;UE functional use;Balance;Pain;Gait;ROM;GMC;Coordination;Decreased knowledge of precautions;IADL;Dexterity;Mobility    Cognitive Skills Attention;Learn;Memory;Perception;Problem Solve;Safety Awareness;Sequencing;Thought;Understand    Rehab Potential Good    Clinical Decision Making Limited treatment options, no task modification necessary    Comorbidities Affecting Occupational Performance: May have comorbidities impacting occupational performance    Modification or Assistance to  Complete Evaluation  No modification of tasks or assist  necessary to complete eval    OT Frequency 2x / week   plus eval, anticipate d/c after 8 weeks   OT Duration 12 weeks    OT Treatment/Interventions Self-care/ADL training;Ultrasound;Energy conservation;Visual/perceptual remediation/compensation;Patient/family education;DME and/or AE instruction;Paraffin;Gait Training;Passive range of motion;Balance training;Fluidtherapy;Cryotherapy;Building services engineer;Therapeutic activities;Manual Therapy;Therapeutic exercise;Moist Heat;Neuromuscular education;Cognitive remediation/compensation    Plan environmental scanning, exercises for proximal strength/ ROM, check on HEP for vision    Consulted and Agree with Plan of Care Patient;Family member/caregiver    Family Member Consulted wife           Patient will benefit from skilled therapeutic intervention in order to improve the following deficits and impairments:   Body Structure / Function / Physical Skills: ADL,UE functional use,Balance,Pain,Gait,ROM,GMC,Coordination,Decreased knowledge of precautions,IADL,Dexterity,Mobility Cognitive Skills: Attention,Learn,Memory,Perception,Problem Solve,Safety Awareness,Sequencing,Thought,Understand     Visit Diagnosis: Muscle weakness (generalized)  Other symptoms and signs involving the nervous system  Visuospatial deficit  Attention and concentration deficit  Frontal lobe and executive function deficit  Unsteadiness on feet    Problem List Patient Active Problem List   Diagnosis Date Noted  . Acute ischemic stroke (HCC) 03/10/2020  . 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 Chronic pain 03/09/2020    RINE,KATHRYN 04/04/2020, 12:20 PM  [facility] [address] [place], [place], [address] Phone: [phone]   Fax:  [phone]  Name: Michael Wells MRN: [id number] Date of Birth: May 04, 1940

## 2020-04-04 NOTE — Therapy (Signed)
[facility] [address] [place], [place], [address] Phone: [phone]   Fax:  [phone]  Physical Therapy Treatment  Patient Details  Name: Michael Wells MRN: [id number] Date of Birth: 1940-11-05 Referring Provider (PT): Azucena Fallen, MD   Encounter Date: 04/04/2020   PT End of Session - 04/04/20 0720    Visit Number 4    Number of Visits 17    Date for PT Re-Evaluation 2020-06-28   written for 60 day POC   Authorization Type [employer]    PT Start Time 0716    PT Stop Time 0756    PT Time Calculation (min) 40 min    Equipment Utilized During Treatment Gait belt    Activity Tolerance Patient tolerated treatment well    Behavior During Therapy [facility] for tasks assessed/performed           Past Medical History:  Diagnosis Date  . Anxiety   . Chronic pain   . H/O sleep disturbance     Past Surgical History:  Procedure Laterality Date  . IR ANGIO VERTEBRAL SEL SUBCLAVIAN INNOMINATE UNI L MOD SED  03/10/2020  . IR INTRAVSC STENT CERV CAROTID W/EMB-PROT MOD SED INCL ANGIO  03/10/2020  . IR STENT PLACEMENT ANTE CAROTID INC ANGIO  03/10/2020      . IR US GUIDE VASC ACCESS RIGHT  03/10/2020  . RADIOLOGY WITH ANESTHESIA N/A 03/10/2020   Procedure: IR WITH ANESTHESIA;  Surgeon: Radiologist, Medication, MD;  Location: [facility] OR;  Service: Radiology;  Laterality: N/A;    There were no vitals filed for this visit.   Subjective Assessment - 04/04/20 0719    Subjective No new complaints. Pt denies any falls or pain today. Reports the HEP is going well.    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    Currently in Pain?  No/denies    Pain Score 0-No pain                  [facility] Adult PT Treatment/Exercise - 04/04/20 0721      Transfers   Transfers Stand to Sit;Sit to Stand    Sit to Stand 5: Supervision;With upper extremity assist;From chair/3-in-1    Stand to Sit 5: Supervision;With upper extremity assist;To chair/3-in-1      Ambulation/Gait   Ambulation/Gait Yes    Ambulation/Gait Assistance 5: Supervision    Ambulation/Gait Assistance Details cues for increased step length and increased hip/knee flexion for improved foot clearance with gait to decrease shuffling. cues needed for reciprocal arm swing as well and upright posture.    Ambulation Distance (Feet) 230 Feet   x1 plus around gym with session   Assistive device None    Gait Pattern Decreased dorsiflexion - right;Decreased dorsiflexion - left;Poor foot clearance - left;Poor foot clearance - right;Shuffle;Right foot flat;Left foot flat;Decreased trunk rotation    Ambulation Surface Level;Indoor      Knee/Hip Exercises: Aerobic   Other Aerobic Scifit UE/LE's level 2.5 x 8 minutes with goal >/=30 rpm for strengthening, activity tolerance and to work on Wellsite geologist. Pt needed cues to to keep moving and for "big" movements to go through full range of motion.  Knee/Hip Exercises: Standing   Forward Step Up Both;1 set;10 reps;Hand Hold: 2;Step Height: 6";Limitations    Forward Step Up Limitations with contralateral march to stance leg with PTA hand as a target to promote increased hip flexion with march for each rep. cues needed on form and technique.      Knee/Hip Exercises: Seated   Long Arc Quad AROM;Strengthening;Both;1 set;10 reps;Weights;Limitations    Long Arc Quad Weight 2 lbs.    Long [employer] Quad Limitations cues for full range, slow motions and to stay tall as pt tended to lean back.    Marching AROM;Strengthening;Both;1 set;10 reps;Weights;Limitations    Marching Limitations cues with PTA hand for target to increase hip  flexion with marching    Marching Weights 2 lbs.               Balance Exercises - 04/04/20 0747      Balance Exercises: Standing   Standing Eyes Opened Wide (BOA);Head turns;Foam/compliant surface;Other reps (comment);Limitations    Standing Eyes Opened Limitations on airex with feet hip width apart- head movements left<>right, up<>down for 10 reps each. cues needed to stay on task with head movements.    Standing Eyes Closed Wide (BOA);Foam/compliant surface;3 reps;30 secs;Limitations    Standing Eyes Closed Limitations on airex with feet hip width apart- 30 sec's x 3 reps. cues to keep eyes closed as pt tends to open them randomly.    Sit to Stand Standard surface;Upper extremity support;Foam/compliant surface;Limitations    Sit to Stand Limitations seated with feet on airex  for sit<>stands x 10 reps, emphasis on tall posture and controlled descent. min guard to min assit for balance.               PT Short Term Goals - 03/22/20 0917      PT SHORT TERM GOAL #1   Title Pt will be independent with initial HEP in order to build upon functional gains made in therapy. ALL STGS DUE 04/19/20    Time 4    Period Weeks    Status New    Target Date 04/19/20      PT SHORT TERM GOAL #2   Title Pt will improve DGI score to at least a 15/24 in order to demo decr fall risk.    Baseline 13/24    Time 4    Period Weeks    Status New      PT SHORT TERM GOAL #3   Title Pt and pt's spouse will verbalize understanding of fall prevention in the home.    Time 4    Period Weeks    Status New      PT SHORT TERM GOAL #4   Title Pt will improve gait speed to at least 2.2 ft/sec in order to demo improved community mobility.    Baseline 1.88 ft/sec with no AD    Time 4    Period Weeks    Status New      PT SHORT TERM GOAL #5   Title Pt will perform 5x sit <> stand with BUE support in 28 seconds or less in order to demo improved functional BLE strength and decr fall risk.    Baseline  33.69 seconds    Time 4    Period Weeks    Status New             PT Long Term Goals - 03/22/20 0919      PT LONG TERM GOAL #1   Title Pt  will be independent with final HEP in order to build upon functional gains made in therapy. ALL LTGS DUE 05/17/20.    Time 8    Period Weeks    Status New    Target Date 05/17/20      PT LONG TERM GOAL #2   Title Pt will improve DGI score to at least a 18/24 in order to demo decr fall risk.    Baseline 13/24    Time 8    Period Weeks    Status New      PT LONG TERM GOAL #3   Title Pt will ambulate 500' outdoors over unlevel surfaces with supervision with no AD vs. LRAD in order to demo improved community mobility.    Time 8    Period Weeks    Status New      PT LONG TERM GOAL #4   Title Pt will improve gait speed to at least 2.5 ft/sec in order to demo improved community mobility.    Baseline 1.88 ft/sec with no AD    Time 8    Period Weeks    Status New      PT LONG TERM GOAL #5   Title Pt will perform 5x sit <> stand with BUE support in 21 seconds or less in order to demo improved functional BLE strength and decr fall risk.    Baseline 33.69 seconds    Time 8    Period Weeks    Status New                 Plan - 04/04/20 0720    Clinical Impression Statement Today's skilled session continued to focus on strengthening, gait and balance on compliant surfaces. Pt continues to need cues for increased hip/knee flexion for improved foot clearance cue to shuffled steps. Pt able to correct for a few steps after cues before returning to shuffled steps. Pt continues to demo a posterior balance loss bias on compliant surfaces. Up to min assist needed to correct. Cues through out session to task as pt is easily distracted (both internally and externally). The pt should benefit from continued PT to progress toward unmet goals.    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, water;Locomotion Level    Examination-Participation Restrictions Community Activity;Driving   playing golf   Stability/Clinical Decision Making Stable/Uncomplicated    Rehab Potential Good    PT Frequency 2x / week    PT Duration 8 weeks    PT Treatment/Interventions ADLs/Self Care Home Management;DME Instruction;Gait training;Therapeutic activities;Functional mobility training;Stair training;Therapeutic exercise;Balance training;Neuromuscular re-education;Patient/family education;Vestibular;Energy conservation    PT Next Visit Plan .work on Technical brewer with calf stretching/strengthening and hip flexor strengthening. Uneven surface balance-static and dynamic- pt has posterior LOB    Consulted and Agree with Plan of Care Patient    Family Member Consulted --           Patient will benefit from skilled therapeutic intervention in order to improve the following deficits and impairments:  Abnormal gait,Decreased balance,Decreased activity tolerance,Decreased coordination,Difficulty walking,Decreased strength  Visit Diagnosis: Muscle weakness (generalized)  Other symptoms and signs involving the nervous system  Unsteadiness on feet  Other abnormalities of gait and mobility     Problem List Patient Active Problem List   Diagnosis Date Noted  . Acute ischemic stroke (HCC) 03/10/2020  . 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 Chronic pain  03/09/2020    Sallyanne Kuster, PTA, [facility] [address], [address] [place], [place] [address] [phone] 04/04/20, 10:35 AM   Name: Michael Wells MRN: [id number] Date of Birth: 10-26-1940

## 2020-04-04 NOTE — Patient Instructions (Signed)
Diplopia HEP:  Perform at least 2 times per day. Stop if your eye becomes fatigued or hurts and try again later.  1. Hold a small object/card in front of you.  Hold it in the middle at arm's length away.    2. Cover your LEFT eye and look at the object with your RIGHT eye.  3. Slowly move the object side to side in front of you while continuing to watch it with your RIGHT eye.  4.  Remember to keep your head still and only move your eye.  5.  Repeat 5-10 times.  6.  Then, move object up and down while watching it 5-10 times.   Coordination Activities  Perform the following activities for 20 minutes 1 times per day with left hand(s).   Rotate ball in fingertips (clockwise and counter-clockwise).  Flip cards 1 at a time as fast as you can.  Deal cards with your thumb (Hold deck in hand and push card off top with thumb).  Pick up coins and place in container or coin bank.  Pick up coins and stack.

## 2020-04-05 ENCOUNTER — Telehealth (HOSPITAL_COMMUNITY): Payer: Self-pay

## 2020-04-05 NOTE — Telephone Encounter (Signed)
Pt's wife called to schedule f/u. Pt not due until May. Will call back at the end of April to schedule. AW

## 2020-04-07 ENCOUNTER — Encounter: Payer: Self-pay | Admitting: Occupational Therapy

## 2020-04-07 ENCOUNTER — Encounter: Payer: Self-pay | Admitting: Physical Therapy

## 2020-04-07 ENCOUNTER — Ambulatory Visit: Payer: Medicare (Managed Care) | Admitting: Physical Therapy

## 2020-04-07 ENCOUNTER — Other Ambulatory Visit: Payer: Self-pay

## 2020-04-07 ENCOUNTER — Ambulatory Visit: Payer: Medicare (Managed Care) | Admitting: Occupational Therapy

## 2020-04-07 DIAGNOSIS — R2681 Unsteadiness on feet: Secondary | ICD-10-CM

## 2020-04-07 DIAGNOSIS — R41844 Frontal lobe and executive function deficit: Secondary | ICD-10-CM

## 2020-04-07 DIAGNOSIS — R41840 Attention and concentration deficit: Secondary | ICD-10-CM

## 2020-04-07 DIAGNOSIS — M6281 Muscle weakness (generalized): Secondary | ICD-10-CM

## 2020-04-07 DIAGNOSIS — R2689 Other abnormalities of gait and mobility: Secondary | ICD-10-CM

## 2020-04-07 DIAGNOSIS — R29818 Other symptoms and signs involving the nervous system: Secondary | ICD-10-CM

## 2020-04-07 DIAGNOSIS — R41842 Visuospatial deficit: Secondary | ICD-10-CM

## 2020-04-07 NOTE — Patient Instructions (Signed)
  Strengthening: Resisted Flexion   Hold tubing with _one arm____ arm(s) at side. Pull forward and up. Move shoulder through pain-free range of motion. Repeat __10__ times per set.  Do _1-2_ sessions per day , every other day hold on to chair with other hand, repeat with other arm   Strengthening: Resisted Extension   Hold tubing in ___left __ hand(s), arm forward. Pull arm back, elbow straight. Repeat _10___ times per set. Do _1-2___ sessions per day, every other day. Repeat with other hand, hold onto chair with hand that is not exercising   Resisted Horizontal Abduction: Bilateral   Sit  tubing in both hands, arms out in front. Keeping arms straight, pinch shoulder blades together and stretch arms out. Repeat _10___ times per set. Do _1-2___ sessions per day, every other day.   Elbow Flexion: Resisted   With tubing held in _one_____ hand(s) and other end secured under foot, curl arm up as far as possible. Repeat _10___ times per set. Do _1-2___ sessions per day, every other day.Repeat with other hand

## 2020-04-07 NOTE — Therapy (Signed)
[facility] [address] [place], [place], [address] Phone: [phone]   Fax:  [phone]  Occupational Therapy Treatment  Patient Details  Name: Michael Wells MRN: [id number] Date of Birth: 1940-03-25 Referring Provider (OT): Dr. Natale Milch (will send to PCP as pt referred by hospitalist)   Encounter Date: 04/07/2020   OT End of Session - 04/07/20 0806    Visit Number 5    Number of Visits 25    Date for OT Re-Evaluation 06/14/20    Authorization Type [employer], out of network    Authorization Time Period 90 days-06/14/20    Authorization - Visit Number 5    Authorization - Number of Visits 10    OT Start Time 0805    OT Stop Time 0845    OT Time Calculation (min) 40 min    Activity Tolerance Patient tolerated treatment well    Behavior During Therapy [facility] for tasks assessed/performed           Past Medical History:  Diagnosis Date  . Anxiety   . Chronic pain   . H/O sleep disturbance     Past Surgical History:  Procedure Laterality Date  . IR ANGIO VERTEBRAL SEL SUBCLAVIAN INNOMINATE UNI L MOD SED  03/10/2020  . IR INTRAVSC STENT CERV CAROTID W/EMB-PROT MOD SED INCL ANGIO  03/10/2020  . IR STENT PLACEMENT ANTE CAROTID INC ANGIO  03/10/2020      . IR US GUIDE VASC ACCESS RIGHT  03/10/2020  . RADIOLOGY WITH ANESTHESIA N/A 03/10/2020   Procedure: IR WITH ANESTHESIA;  Surgeon: Radiologist, Medication, MD;  Location: [facility] OR;  Service: Radiology;  Laterality: N/A;    There were no vitals filed for this visit.   Subjective Assessment - 04/07/20 0841    Subjective  Denies pain    Pertinent History 80 y.o. 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    Patient Stated Goals improve balance and vision    Currently in Pain? No/denies                 Treatment: environmental scanning :pt. 11/15 located first pass, 2 located second pass then 2 located 3rd pass, min v.c Copying small peg design for coordination, vision/ cognition, max v.c for design, min difficulty for coordination                OT Treatment/Education - 04/07/20 1009    Education Details yellow theraband HEP, pt/ wife educated, handout issued- pt required mod v.c    Person(s) Educated Patient;Spouse    Methods Explanation;Verbal cues;Handout;Demonstration    Comprehension Verbalized understanding;Returned demonstration;Verbal cues required;Need further instruction            OT Short Term Goals - 03/29/20 1025      OT SHORT TERM GOAL #1   Title I with inital HEP    Time 4    Period Weeks    Status New    Target Date 04/19/20      OT SHORT TERM GOAL #2   Title Pt will verbalize understanding of compensatory strategeis for visual deficits    Time 4    Period Weeks    Status New      OT SHORT TERM GOAL #3   Title Pt/ wife will verbalize understanding of compensatory strategeis for cognitve/memory deficits.  Time 4    Period Weeks    Status New      OT SHORT TERM GOAL #4   Title Pt will perform tabletop scanning activities with  80% or better accuracy.    Time 4    Period Weeks    Status New      OT SHORT TERM GOAL #5   Title Pt will navigate a min distracting environment and locate items with 75% or better accuracy.    Time 4    Period Weeks    Status New      OT SHORT TERM GOAL #6   Title Pt will demonstrate ability to sequence a simple functional task with 90% or better accuracy in a reasonable amount of time    Baseline delayed processing    Time 4    Period Weeks    Status New             OT Long Term Goals - 03/29/20 1206      OT LONG TERM GOAL #1   Title I with updated HEP.    Time 12    Period Weeks    Status New      OT LONG TERM GOAL #2   Title Pt will perfom environmental scanning  activities in a mod distracting environment with 90% better accuracy.    Time 12    Period Weeks      OT LONG TERM GOAL #3   Title Pt will perfrom shower transfer and shower, at a modified independent level    Baseline pt is currently sponge bathing.    Time 12    Period Weeks    Status New      OT LONG TERM GOAL #4   Title Pt will retrieve 3 lbs fom an overhead shelf with RUE with pain no greater than 2/10    Time 12    Period Weeks    Status New      OT LONG TERM GOAL #5   Title Pt will demonstrate improved LUE fine motor coordiantion as evidenced by decreasing LUE 9 hole peg test score to 60 secs or less.    Baseline RUE 43, LUE 1 min 17 secs    Time 12    Period Weeks    Status New      OT LONG TERM GOAL #6   Title I with all basic ADLs.    Time 12    Period Weeks    Status New                 Plan - 04/07/20 0806    Clinical Impression Statement Pt is progressing towards goals. He reports performing vision HEP at home.    OT Occupational Profile and History Detailed Assessment- Review of Records and additional review of physical, cognitive, psychosocial history related to current functional performance    Occupational performance deficits (Please refer to evaluation for details): ADL's;IADL's;Leisure;Social Participation    Body Structure / Function / Physical Skills ADL;UE functional use;Balance;Pain;Gait;ROM;GMC;Coordination;Decreased knowledge of precautions;IADL;Dexterity;Mobility    Cognitive Skills Attention;Learn;Memory;Perception;Problem Solve;Safety Awareness;Sequencing;Thought;Understand    Rehab Potential Good    Clinical Decision Making Limited treatment options, no task modification necessary    Comorbidities Affecting Occupational Performance: May have comorbidities impacting occupational performance    Modification or Assistance to Complete Evaluation  No modification of tasks or assist necessary to complete eval    OT Frequency 2x / week   plus  eval, anticipate d/c after 8  weeks   OT Duration 12 weeks    OT Treatment/Interventions Self-care/ADL training;Ultrasound;Energy conservation;Visual/perceptual remediation/compensation;Patient/family education;DME and/or AE instruction;Paraffin;Gait Training;Passive range of motion;Balance training;Fluidtherapy;Cryotherapy;Building services engineer;Therapeutic activities;Manual Therapy;Therapeutic exercise;Moist Heat;Neuromuscular education;Cognitive remediation/compensation    Plan review yellow theraband HEP, reveiw vision HEP    Consulted and Agree with Plan of Care Patient;Family member/caregiver    Family Member Consulted wife           Patient will benefit from skilled therapeutic intervention in order to improve the following deficits and impairments:   Body Structure / Function / Physical Skills: ADL,UE functional use,Balance,Pain,Gait,ROM,GMC,Coordination,Decreased knowledge of precautions,IADL,Dexterity,Mobility Cognitive Skills: Attention,Learn,Memory,Perception,Problem Solve,Safety Awareness,Sequencing,Thought,Understand     Visit Diagnosis: Muscle weakness (generalized)  Other symptoms and signs involving the nervous system  Unsteadiness on feet  Visuospatial deficit  Attention and concentration deficit  Frontal lobe and executive function deficit    Problem List Patient Active Problem List   Diagnosis Date Noted  . Acute ischemic stroke (HCC) 03/10/2020  . 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 Chronic pain 03/09/2020    Michael Wells 04/07/2020, 10:11 AM Keene Breath, OTR/L [phone] Phone: [phone] 12:05 PM 04/07/20 [facility] [address] [place], [place], [address] Phone: [phone]   Fax:  [phone]  Name: Michael Wells MRN: [id number] Date of Birth: May 29, 1940

## 2020-04-07 NOTE — Therapy (Signed)
[facility] [address] [place], [place], [address] Phone: [phone]   Fax:  [phone]  Physical Therapy Treatment  Patient Details  Name: Michael Wells MRN: [id number] Date of Birth: 07-20-1940 Referring Provider (PT): Azucena Fallen, MD   Encounter Date: 04/07/2020   PT End of Session - 04/07/20 0805    Visit Number 5    Number of Visits 17    Date for PT Re-Evaluation 06/14/2020   written for 60 day POC   Authorization Type [employer]    PT Start Time 0718    PT Stop Time 0759    PT Time Calculation (min) 41 min    Equipment Utilized During Treatment Gait belt    Activity Tolerance Patient tolerated treatment well    Behavior During Therapy [facility] for tasks assessed/performed           Past Medical History:  Diagnosis Date  . Anxiety   . Chronic pain   . H/O sleep disturbance     Past Surgical History:  Procedure Laterality Date  . IR ANGIO VERTEBRAL SEL SUBCLAVIAN INNOMINATE UNI L MOD SED  03/10/2020  . IR INTRAVSC STENT CERV CAROTID W/EMB-PROT MOD SED INCL ANGIO  03/10/2020  . IR STENT PLACEMENT ANTE CAROTID INC ANGIO  03/10/2020      . IR US GUIDE VASC ACCESS RIGHT  03/10/2020  . RADIOLOGY WITH ANESTHESIA N/A 03/10/2020   Procedure: IR WITH ANESTHESIA;  Surgeon: Radiologist, Medication, MD;  Location: [facility] OR;  Service: Radiology;  Laterality: N/A;    There were no vitals filed for this visit.   Subjective Assessment - 04/07/20 0720    Subjective No falls. No changes.    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    Currently in Pain? No/denies                             [facility] Adult  PT Treatment/Exercise - 04/07/20 0724      Ambulation/Gait   Ambulation/Gait Yes    Ambulation/Gait Assistance 5: Supervision    Ambulation/Gait Assistance Details cues for heel strike (able to perform for a couple steps before going back to shuffled pattern), and taking bigger steps.     Ambulation Distance (Feet) 230 Feet    Assistive device None    Gait Pattern Decreased dorsiflexion - right;Decreased dorsiflexion - left;Poor foot clearance - left;Poor foot clearance - right;Shuffle;Right foot flat;Left foot flat;Decreased trunk rotation    Ambulation Surface Level;Indoor      Neuro Re-ed    Neuro Re-ed Details  using agility ladder - stepping one foot in each block for incr step length/foot clearance, down and back x5 reps, cues to perform slowly as pt initially performing too quickly, when going slowly needs min A at times for balance.      Knee/Hip Exercises: Aerobic   Other Aerobic Scifit UE/LE's level 2.5 x 6 minutes with goal >/=30 rpm for strengthening, activity tolerance and to work on reciprocal movemements. Pt needed cues to to keep moving and for "big" movements and to really kick his legs out each time to go through full  range of motion               Balance Exercises - 04/07/20 0737      Balance Exercises: Standing   Rockerboard Anterior/posterior;EO;Limitations    Rockerboard Limitations weight shifting x10 reps with BUE support - cues for technique, holding board steady - 2 x 30 seconds with UE support as needed, pt reporting incr back pain on rockerboard and needing to get off    Marching Upper extremity assist 1;Foam/compliant surface;Static;10 reps;Limitations   2 sets   Marching Limitations Standing on air ex: alternating marching with cues to how high to lift up leg for hip/knee flexion using boomwhacker, incr difficulty lifting RLE at times    Other Standing Exercises alternating forward stepping over 4" beam x10 reps, then repeated alternating lateral stepping  x10 reps, cues for incr foot clearance and for alternating feet    Other Standing Exercises Comments --               PT Short Term Goals - 03/22/20 9379      PT SHORT TERM GOAL #1   Title Pt will be independent with initial HEP in order to build upon functional gains made in therapy. ALL STGS DUE 04/19/20    Time 4    Period Weeks    Status New    Target Date 04/19/20      PT SHORT TERM GOAL #2   Title Pt will improve DGI score to at least a 15/24 in order to demo decr fall risk.    Baseline 13/24    Time 4    Period Weeks    Status New      PT SHORT TERM GOAL #3   Title Pt and pt's spouse will verbalize understanding of fall prevention in the home.    Time 4    Period Weeks    Status New      PT SHORT TERM GOAL #4   Title Pt will improve gait speed to at least 2.2 ft/sec in order to demo improved community mobility.    Baseline 1.88 ft/sec with no AD    Time 4    Period Weeks    Status New      PT SHORT TERM GOAL #5   Title Pt will perform 5x sit <> stand with BUE support in 28 seconds or less in order to demo improved functional BLE strength and decr fall risk.    Baseline 33.69 seconds    Time 4    Period Weeks    Status New             PT Long Term Goals - 03/22/20 0919      PT LONG TERM GOAL #1   Title Pt will be independent with final HEP in order to build upon functional gains made in therapy. ALL LTGS DUE 05/17/20.    Time 8    Period Weeks    Status New    Target Date 05/17/20      PT LONG TERM GOAL #2   Title Pt will improve DGI score to at least a 18/24 in order to demo decr fall risk.    Baseline 13/24    Time 8    Period Weeks    Status New      PT LONG TERM GOAL #3   Title Pt will ambulate 500' outdoors over unlevel surfaces with supervision with no AD vs. LRAD in order to demo improved community mobility.  Time 8    Period Weeks    Status New      PT LONG TERM GOAL #4   Title Pt will improve gait speed to at least 2.5 ft/sec in  order to demo improved community mobility.    Baseline 1.88 ft/sec with no AD    Time 8    Period Weeks    Status New      PT LONG TERM GOAL #5   Title Pt will perform 5x sit <> stand with BUE support in 21 seconds or less in order to demo improved functional BLE strength and decr fall risk.    Baseline 33.69 seconds    Time 8    Period Weeks    Status New                 Plan - 04/07/20 0807    Clinical Impression Statement Today's skilled session focused on strengthening, gait training, and balance with focus on foot clearance/compliant surfaces. Pt continues with shuffled steps, did well for a few steps with cues for heel strike first and also taking longer steps, however then pt reverts back to shuffled pattern. Will continue to progress towards LTGs.    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, water;Locomotion Level    Examination-Participation Restrictions Community Activity;Driving   playing golf   Stability/Clinical Decision Making Stable/Uncomplicated    Rehab Potential Good    PT Frequency 2x / week    PT Duration 8 weeks    PT Treatment/Interventions ADLs/Self Care Home Management;DME Instruction;Gait training;Therapeutic activities;Functional mobility training;Stair training;Therapeutic exercise;Balance training;Neuromuscular re-education;Patient/family education;Vestibular;Energy conservation    PT Next Visit Plan work on foot clearance/gait mechanics with calf stretching/strengthening and hip flexor strengthening. Uneven surface balance-static and dynamic- pt has posterior LOB. warm up on SciFit    Consulted and Agree with Plan of Care Patient           Patient will benefit from skilled therapeutic intervention in order to improve the following deficits and impairments:  Abnormal gait,Decreased balance,Decreased activity  tolerance,Decreased coordination,Difficulty walking,Decreased strength  Visit Diagnosis: Muscle weakness (generalized)  Other symptoms and signs involving the nervous system  Unsteadiness on feet  Other abnormalities of gait and mobility     Problem List Patient Active Problem List   Diagnosis Date Noted  . Acute ischemic stroke (HCC) 03/10/2020  . 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 Chronic pain 03/09/2020    Drake Leach, PT, DPT  04/07/2020, 8:09 AM  [facility] [address] [place], [place], [address] Phone: [phone]   Fax:  [phone]  Name: Michael Wells MRN: [id number] Date of Birth: 26-Jul-1940

## 2020-04-11 ENCOUNTER — Encounter: Payer: Self-pay | Admitting: Physical Therapy

## 2020-04-11 ENCOUNTER — Other Ambulatory Visit: Payer: Self-pay

## 2020-04-11 ENCOUNTER — Ambulatory Visit: Payer: Medicare (Managed Care) | Admitting: Occupational Therapy

## 2020-04-11 ENCOUNTER — Ambulatory Visit: Payer: Medicare (Managed Care) | Admitting: Physical Therapy

## 2020-04-11 DIAGNOSIS — R41840 Attention and concentration deficit: Secondary | ICD-10-CM

## 2020-04-11 DIAGNOSIS — R41842 Visuospatial deficit: Secondary | ICD-10-CM

## 2020-04-11 DIAGNOSIS — R29818 Other symptoms and signs involving the nervous system: Secondary | ICD-10-CM

## 2020-04-11 DIAGNOSIS — R2681 Unsteadiness on feet: Secondary | ICD-10-CM

## 2020-04-11 DIAGNOSIS — M6281 Muscle weakness (generalized): Secondary | ICD-10-CM

## 2020-04-11 DIAGNOSIS — R41844 Frontal lobe and executive function deficit: Secondary | ICD-10-CM

## 2020-04-11 DIAGNOSIS — R2689 Other abnormalities of gait and mobility: Secondary | ICD-10-CM

## 2020-04-11 NOTE — Therapy (Signed)
[facility] [address] [place], [place], [address] Phone: [phone]   Fax:  [phone]  Occupational Therapy Treatment  Patient Details  Name: Michael Wells MRN: [id number] Date of Birth: [birth year] Referring Provider (OT): Dr. Natale Milch (will send to PCP as pt referred by hospitalist)   Encounter Date: 04/11/2020   OT End of Session - 04/11/20 0812    Visit Number 6    Number of Visits 25    Date for OT Re-Evaluation 06/14/20    Authorization Type [employer], out of network    Authorization Time Period 90 days-06/14/20    Authorization - Visit Number 6    Authorization - Number of Visits 10    OT Start Time 0803    OT Stop Time 0847    OT Time Calculation (min) 44 min    Activity Tolerance Patient tolerated treatment well    Behavior During Therapy [facility] for tasks assessed/performed           Past Medical History:  Diagnosis Date  . Anxiety   . Chronic pain   . H/O sleep disturbance     Past Surgical History:  Procedure Laterality Date  . IR ANGIO VERTEBRAL SEL SUBCLAVIAN INNOMINATE UNI L MOD SED  03/10/2020  . IR INTRAVSC STENT CERV CAROTID W/EMB-PROT MOD SED INCL ANGIO  03/10/2020  . IR STENT PLACEMENT ANTE CAROTID INC ANGIO  03/10/2020      . IR US GUIDE VASC ACCESS RIGHT  03/10/2020  . RADIOLOGY WITH ANESTHESIA N/A 03/10/2020   Procedure: IR WITH ANESTHESIA;  Surgeon: Radiologist, Medication, MD;  Location: [facility] OR;  Service: Radiology;  Laterality: N/A;    There were no vitals filed for this visit.   Subjective Assessment - 04/11/20 0830    Subjective  Denies pain    Pertinent History 80 y.o. 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    Patient Stated Goals improve balance and vision    Currently in Pain? No/denies                        Treatment: Placing various shaped pieces in concentration board with LUE for increased  fine motor coordination and visual scanning, min-mod difficulty, min v.c  Arm bike x 6 mins level 1 for conditioning, mod v.c to maintain speed. Standing to perform functional reaching with bilateral UE's to place graded clothespins on vertical antenna, min-mod v.c          OT treatment/  Education - 04/11/20 0831    Education Details updates to vision HEP to include both eyes, pt's wife is assisting him by holding card, pt returned demonstration wih min v.c, Reveiwed yellow theraband HEP 10-15 reps each, min-mod v.c for performance.    Person(s) Educated Patient;Spouse    Methods Explanation;Verbal cues;Handout;Demonstration    Comprehension Verbalized understanding;Returned demonstration;Verbal cues required            OT Short Term Goals - 03/29/20 0821      OT SHORT TERM GOAL #1   Title I with inital HEP    Time 4    Period Weeks    Status New    Target Date 04/19/20      OT SHORT TERM GOAL #2   Title Pt will verbalize understanding of compensatory strategeis for visual  deficits    Time 4    Period Weeks    Status New      OT SHORT TERM GOAL #3   Title Pt/ wife will verbalize understanding of compensatory strategeis for cognitve/memory deficits.    Time 4    Period Weeks    Status New      OT SHORT TERM GOAL #4   Title Pt will perform tabletop scanning activities with  80% or better accuracy.    Time 4    Period Weeks    Status New      OT SHORT TERM GOAL #5   Title Pt will navigate a min distracting environment and locate items with 75% or better accuracy.    Time 4    Period Weeks    Status New      OT SHORT TERM GOAL #6   Title Pt will demonstrate ability to sequence a simple functional task with 90% or better accuracy in a reasonable amount of time    Baseline delayed processing    Time 4    Period Weeks    Status New              OT Long Term Goals - 03/29/20 1206      OT LONG TERM GOAL #1   Title I with updated HEP.    Time 12    Period Weeks    Status New      OT LONG TERM GOAL #2   Title Pt will perfom environmental scanning activities in a mod distracting environment with 90% better accuracy.    Time 12    Period Weeks      OT LONG TERM GOAL #3   Title Pt will perfrom shower transfer and shower, at a modified independent level    Baseline pt is currently sponge bathing.    Time 12    Period Weeks    Status New      OT LONG TERM GOAL #4   Title Pt will retrieve 3 lbs fom an overhead shelf with RUE with pain no greater than 2/10    Time 12    Period Weeks    Status New      OT LONG TERM GOAL #5   Title Pt will demonstrate improved LUE fine motor coordiantion as evidenced by decreasing LUE 9 hole peg test score to 60 secs or less.    Baseline RUE 43, LUE 1 min 17 secs    Time 12    Period Weeks    Status New      OT LONG TERM GOAL #6   Title I with all basic ADLs.    Time 12    Period Weeks    Status New                  Patient will benefit from skilled therapeutic intervention in order to improve the following deficits and impairments:           Visit Diagnosis: Muscle weakness (generalized)  Other symptoms and signs involving the nervous system  Visuospatial deficit  Attention and concentration deficit  Frontal lobe and executive function deficit  Other abnormalities of gait and mobility  Unsteadiness on feet    Problem List Patient Active Problem List   Diagnosis Date Noted  . Acute ischemic stroke (HCC) 03/10/2020  . 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 Chronic pain 03/09/2020    Michael Wells 04/11/2020, 9:14 AM  [facility] [address] [place], [place], [address] Phone: [phone]   Fax:  [phone]  Name: Michael Wells MRN: [id number] Date of Birth: 11/04/1940

## 2020-04-11 NOTE — Patient Instructions (Signed)
Vision  HEP:  Perform at least 2 times per day. Stop if your eye becomes fatigued or hurts and try again later.  1. Hold a small object/card in front of you.  Hold it in the middle at arm's length away.    2. Cover your LEFT eye and look at the object with your RIGHT eye.  3. Slowly move the object side to side in front of you while continuing to watch it with your RIGHT eye.  4.  Remember to keep your head still and only move your eye.  5.  Repeat 5-10 times.  6.  Then, move object up and down while watching it 5-10 times.  7. Cover your RIGHT eye and look at the object with your LEFT eye while you repeat #1-6 above.  8. . repeat with both eyes moving slowly and only in the range that you can keep the image 1. ( perform side to side 10 reps and up and down 10 reps)

## 2020-04-11 NOTE — Therapy (Signed)
[facility] [address] [place], [place], [address] Phone: [phone]   Fax:  [phone]  Physical Therapy Treatment  Patient Details  Name: Michael Wells MRN: [id number] Date of Birth: Apr 05, 1940 Referring Provider (PT): Azucena Fallen, MD   Encounter Date: 04/11/2020   PT End of Session - 04/11/20 0721    Visit Number 6    Number of Visits 17    Date for PT Re-Evaluation 06/22/2020   written for 60 day POC   Authorization Type [employer]    PT Start Time 0718    PT Stop Time 0758    PT Time Calculation (min) 40 min    Equipment Utilized During Treatment Gait belt    Activity Tolerance Patient tolerated treatment well    Behavior During Therapy [facility] for tasks assessed/performed           Past Medical History:  Diagnosis Date  . Anxiety   . Chronic pain   . H/O sleep disturbance     Past Surgical History:  Procedure Laterality Date  . IR ANGIO VERTEBRAL SEL SUBCLAVIAN INNOMINATE UNI L MOD SED  03/10/2020  . IR INTRAVSC STENT CERV CAROTID W/EMB-PROT MOD SED INCL ANGIO  03/10/2020  . IR STENT PLACEMENT ANTE CAROTID INC ANGIO  03/10/2020      . IR US GUIDE VASC ACCESS RIGHT  03/10/2020  . RADIOLOGY WITH ANESTHESIA N/A 03/10/2020   Procedure: IR WITH ANESTHESIA;  Surgeon: Radiologist, Medication, MD;  Location: [facility] OR;  Service: Radiology;  Laterality: N/A;    There were no vitals filed for this visit.   Subjective Assessment - 04/11/20 0720    Subjective No new complaints. No falls or pain to report. States the HEP is going well at home.    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    Currently  in Pain? No/denies    Pain Score 0-No pain                  [facility] Adult PT Treatment/Exercise - 04/11/20 0722      Transfers   Transfers Stand to Sit;Sit to Stand    Sit to Stand 5: Supervision;With upper extremity assist;From chair/3-in-1    Stand to Sit 5: Supervision;With upper extremity assist;To chair/3-in-1      Ambulation/Gait   Ambulation/Gait Yes    Ambulation/Gait Assistance 5: Supervision    Ambulation/Gait Assistance Details cues for larger steps and for heel strike with bil feet due to shuffled steps.    Ambulation Distance (Feet) --   around gym with session   Assistive device None    Gait Pattern Decreased dorsiflexion - right;Decreased dorsiflexion - left;Poor foot clearance - left;Poor foot clearance - right;Shuffle;Right foot flat;Left foot flat;Decreased trunk rotation    Ambulation Surface Level;Indoor      Exercises   Exercises --      Knee/Hip Exercises: Aerobic   Other Aerobic Scifit UE/LE's level 2.7 x 8 minutes with goal >/=30 rpm for strengthening, activity tolerance. Had seat in farthest position to also promote increased hip and knee exension. Pt needed cues to to keep moving and for "big" movements and to really kick  his legs out each time to go through full range of motion      Knee/Hip Exercises: Machines for Strengthening   Cybex Leg Press bil LE's- 80#'s 2 sets of 10 reps with cues for full extension and cues/assist for controlled return with each rep.               Balance Exercises - 04/11/20 0737      Balance Exercises: Standing   Rockerboard Anterior/posterior;Lateral;Head turns;EO;EC;30 seconds;10 reps;Limitations    Rockerboard Limitations performed both ways on balance board with no UE support: rocking the board with EO, progressing to EC. then holding the board steady for EC 30 sec's x 3 reps, progressing to EC head movements left<>right, up<>down for ~10 reps each. cues needed throughout for posture, weight shifitng and to keep  eyes closed. increased cues needed with board in lateral direction for technique to rick the board.               PT Short Term Goals - 03/22/20 0917      PT SHORT TERM GOAL #1   Title Pt will be independent with initial HEP in order to build upon functional gains made in therapy. ALL STGS DUE 04/19/20    Time 4    Period Weeks    Status New    Target Date 04/19/20      PT SHORT TERM GOAL #2   Title Pt will improve DGI score to at least a 15/24 in order to demo decr fall risk.    Baseline 13/24    Time 4    Period Weeks    Status New      PT SHORT TERM GOAL #3   Title Pt and pt's spouse will verbalize understanding of fall prevention in the home.    Time 4    Period Weeks    Status New      PT SHORT TERM GOAL #4   Title Pt will improve gait speed to at least 2.2 ft/sec in order to demo improved community mobility.    Baseline 1.88 ft/sec with no AD    Time 4    Period Weeks    Status New      PT SHORT TERM GOAL #5   Title Pt will perform 5x sit <> stand with BUE support in 28 seconds or less in order to demo improved functional BLE strength and decr fall risk.    Baseline 33.69 seconds    Time 4    Period Weeks    Status New             PT Long Term Goals - 03/22/20 0919      PT LONG TERM GOAL #1   Title Pt will be independent with final HEP in order to build upon functional gains made in therapy. ALL LTGS DUE 05/17/20.    Time 8    Period Weeks    Status New    Target Date 05/17/20      PT LONG TERM GOAL #2   Title Pt will improve DGI score to at least a 18/24 in order to demo decr fall risk.    Baseline 13/24    Time 8    Period Weeks    Status New      PT LONG TERM GOAL #3   Title Pt will ambulate 500' outdoors over unlevel surfaces with supervision with no AD vs. LRAD in order to demo improved community mobility.    Time  8    Period Weeks    Status New      PT LONG TERM GOAL #4   Title Pt will improve gait speed to at least 2.5 ft/sec in order  to demo improved community mobility.    Baseline 1.88 ft/sec with no AD    Time 8    Period Weeks    Status New      PT LONG TERM GOAL #5   Title Pt will perform 5x sit <> stand with BUE support in 21 seconds or less in order to demo improved functional BLE strength and decr fall risk.    Baseline 33.69 seconds    Time 8    Period Weeks    Status New                 Plan - 04/11/20 0721    Clinical Impression Statement Today's skilled session continued to focus on strengthening and balance training on compliant surfaces. Pt needed cues to task throughout session. Continues with flexed knees and shuffled steps with gait, can correct for 1-2 steps after cued then returns to this gait pattern. The pt is slolwy progressing toward goals and should benefit from continued PT to progress toward unmet goals.    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, water;Locomotion Level    Examination-Participation Restrictions Community Activity;Driving   playing golf   Stability/Clinical Decision Making Stable/Uncomplicated    Rehab Potential Good    PT Frequency 2x / week    PT Duration 8 weeks    PT Treatment/Interventions ADLs/Self Care Home Management;DME Instruction;Gait training;Therapeutic activities;Functional mobility training;Stair training;Therapeutic exercise;Balance training;Neuromuscular re-education;Patient/family education;Vestibular;Energy conservation    PT Next Visit Plan work on foot clearance/gait mechanics with calf stretching/strengthening and hip flexor strengthening. Uneven surface balance-static and dynamic- pt has posterior LOB. warm up on SciFit. STGs cue 04/19/20    Consulted and Agree with Plan of Care Patient    Family Member Consulted pt's wife           Patient will benefit from skilled therapeutic intervention in order to  improve the following deficits and impairments:  Abnormal gait,Decreased balance,Decreased activity tolerance,Decreased coordination,Difficulty walking,Decreased strength  Visit Diagnosis: Muscle weakness (generalized)  Other symptoms and signs involving the nervous system  Unsteadiness on feet     Problem List Patient Active Problem List   Diagnosis Date Noted  . Acute ischemic stroke (HCC) 03/10/2020  . 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 Chronic pain 03/09/2020    Sallyanne Kuster, PTA, [facility] [address] [place], [place] [address] [phone] 04/11/20, 8:13 AM   Name: Michael Wells MRN: [id number] Date of Birth: 1940-05-24

## 2020-04-13 ENCOUNTER — Encounter: Payer: Self-pay | Admitting: Physical Therapy

## 2020-04-13 ENCOUNTER — Other Ambulatory Visit: Payer: Self-pay

## 2020-04-13 ENCOUNTER — Ambulatory Visit: Payer: Medicare (Managed Care) | Admitting: Occupational Therapy

## 2020-04-13 ENCOUNTER — Ambulatory Visit: Payer: Medicare (Managed Care) | Admitting: Physical Therapy

## 2020-04-13 DIAGNOSIS — R2681 Unsteadiness on feet: Secondary | ICD-10-CM | POA: Diagnosis not present

## 2020-04-13 DIAGNOSIS — R29818 Other symptoms and signs involving the nervous system: Secondary | ICD-10-CM

## 2020-04-13 DIAGNOSIS — M6281 Muscle weakness (generalized): Secondary | ICD-10-CM

## 2020-04-13 DIAGNOSIS — R41840 Attention and concentration deficit: Secondary | ICD-10-CM

## 2020-04-13 DIAGNOSIS — R2689 Other abnormalities of gait and mobility: Secondary | ICD-10-CM

## 2020-04-13 DIAGNOSIS — R41842 Visuospatial deficit: Secondary | ICD-10-CM

## 2020-04-13 DIAGNOSIS — R41844 Frontal lobe and executive function deficit: Secondary | ICD-10-CM

## 2020-04-13 NOTE — Therapy (Signed)
[facility] [address] [place], [place], [address] Phone: [phone]   Fax:  [phone]  Occupational Therapy Treatment  Patient Details  Name: Michael Wells MRN: [id number] Date of Birth: 12-29-1940 Referring Provider (OT): Dr. Natale Milch (will send to PCP as pt referred by hospitalist)   Encounter Date: 04/13/2020   OT End of Session - 04/13/20 0832    Visit Number 7    Number of Visits 25    Date for OT Re-Evaluation 06/14/20    Authorization Type [employer], out of network    Authorization - Visit Number 7    Authorization - Number of Visits 10    OT Start Time 0803    OT Stop Time 0845    OT Time Calculation (min) 42 min           Past Medical History:  Diagnosis Date  . Anxiety   . Chronic pain   . H/O sleep disturbance     Past Surgical History:  Procedure Laterality Date  . IR ANGIO VERTEBRAL SEL SUBCLAVIAN INNOMINATE UNI L MOD SED  03/10/2020  . IR INTRAVSC STENT CERV CAROTID W/EMB-PROT MOD SED INCL ANGIO  03/10/2020  . IR STENT PLACEMENT ANTE CAROTID INC ANGIO  03/10/2020      . IR US GUIDE VASC ACCESS RIGHT  03/10/2020  . RADIOLOGY WITH ANESTHESIA N/A 03/10/2020   Procedure: IR WITH ANESTHESIA;  Surgeon: Radiologist, Medication, MD;  Location: [facility] OR;  Service: Radiology;  Laterality: N/A;    There were no vitals filed for this visit.   Subjective Assessment - 04/13/20 0832    Subjective  Denies pain    Pertinent History 80 y.o. 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    Patient Stated Goals improve balance and vision    Currently in Pain? No/denies                  Treatment environmental scanning basic 15/16 located first pass. Pt demonstrates improved scanning and balance. Completing 12 piece  puzzle for visual and cognitive skills, mod difficulty, min-mod v.c  Standing to place graded clothespins on vertical antennae with right and left UE's min v.c, min difficulty for increased strength, endurance and functional reach. Arm bike x 6 mins level 1 for conditioning. Min v.c to maintain speed, pt maintained 20-30 rpm                OT Short Term Goals - 04/13/20 0817      OT SHORT TERM GOAL #1   Title I with inital HEP    Time 4    Period Weeks    Status On-going    Target Date 04/19/20      OT SHORT TERM GOAL #2   Title Pt will verbalize understanding of compensatory strategeis for visual deficits    Time 4    Period Weeks    Status On-going      OT SHORT TERM GOAL #3   Title Pt/ wife will verbalize understanding of compensatory strategeis for cognitve/memory deficits.    Time 4    Period Weeks    Status On-going      OT SHORT TERM GOAL #4   Title Pt will perform tabletop scanning activities with  80% or better accuracy.    Time 4  Period Weeks    Status On-going      OT SHORT TERM GOAL #5   Title Pt will navigate a min distracting environment and locate items with 75% or better accuracy.    Time 4    Period Weeks    Status On-going      OT SHORT TERM GOAL #6   Title Pt will demonstrate ability to sequence a simple functional task with 90% or better accuracy in a reasonable amount of time    Baseline delayed processing    Time 4    Period Weeks    Status On-going             OT Long Term Goals - 03/29/20 1206      OT LONG TERM GOAL #1   Title I with updated HEP.    Time 12    Period Weeks    Status New      OT LONG TERM GOAL #2   Title Pt will perfom environmental scanning activities in a mod distracting environment with 90% better accuracy.    Time 12    Period Weeks      OT LONG TERM GOAL #3   Title Pt will perfrom shower transfer and shower, at a modified independent level    Baseline pt is currently sponge bathing.    Time  12    Period Weeks    Status New      OT LONG TERM GOAL #4   Title Pt will retrieve 3 lbs fom an overhead shelf with RUE with pain no greater than 2/10    Time 12    Period Weeks    Status New      OT LONG TERM GOAL #5   Title Pt will demonstrate improved LUE fine motor coordiantion as evidenced by decreasing LUE 9 hole peg test score to 60 secs or less.    Baseline RUE 43, LUE 1 min 17 secs    Time 12    Period Weeks    Status New      OT LONG TERM GOAL #6   Title I with all basic ADLs.    Time 12    Period Weeks    Status New                  Patient will benefit from skilled therapeutic intervention in order to improve the following deficits and impairments:           Visit Diagnosis: Muscle weakness (generalized)  Other symptoms and signs involving the nervous system  Visuospatial deficit  Attention and concentration deficit  Frontal lobe and executive function deficit    Problem List Patient Active Problem List   Diagnosis Date Noted  . Acute ischemic stroke (HCC) 03/10/2020  . 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 Chronic pain 03/09/2020    [name] 04/13/2020, 8:33 AM  [facility] [address] [place], [place], [address] Phone: [phone]   Fax:  [phone]  Name: Michael Wells MRN: [id number] Date of Birth: [birth year]

## 2020-04-13 NOTE — Therapy (Signed)
[facility] [address] [place], [place], [address] Phone: [phone]   Fax:  [phone]  Physical Therapy Treatment  Patient Details  Name: Michael Wells MRN: [id number] Date of Birth: [birth year] Referring Provider (PT): Azucena Fallen, MD   Encounter Date: 04/13/2020   PT End of Session - 04/13/20 0724    Visit Number 7    Number of Visits 17    Date for PT Re-Evaluation 06/18/2020   written for 60 day POC   Authorization Type [employer]    PT Start Time 0719    PT Stop Time 0800    PT Time Calculation (min) 41 min    Equipment Utilized During Treatment Gait belt    Activity Tolerance Patient tolerated treatment well    Behavior During Therapy [facility] for tasks assessed/performed           Past Medical History:  Diagnosis Date  . Anxiety   . Chronic pain   . H/O sleep disturbance     Past Surgical History:  Procedure Laterality Date  . IR ANGIO VERTEBRAL SEL SUBCLAVIAN INNOMINATE UNI L MOD SED  03/10/2020  . IR INTRAVSC STENT CERV CAROTID W/EMB-PROT MOD SED INCL ANGIO  03/10/2020  . IR STENT PLACEMENT ANTE CAROTID INC ANGIO  03/10/2020      . IR US GUIDE VASC ACCESS RIGHT  03/10/2020  . RADIOLOGY WITH ANESTHESIA N/A 03/10/2020   Procedure: IR WITH ANESTHESIA;  Surgeon: Radiologist, Medication, MD;  Location: [facility] OR;  Service: Radiology;  Laterality: N/A;    There were no vitals filed for this visit.   Subjective Assessment - 04/13/20 0723    Subjective No new complaints. No falls or pain to report. States the HEP is going well at home.    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    Currently in Pain?  No/denies    Pain Score 0-No pain                [facility] Adult PT Treatment/Exercise - 04/13/20 0724      Transfers   Transfers Stand to Sit;Sit to Stand    Sit to Stand 5: Supervision;With upper extremity assist;From chair/3-in-1    Stand to Sit 5: Supervision;With upper extremity assist;To chair/3-in-1      Ambulation/Gait   Ambulation/Gait Yes    Ambulation/Gait Assistance 5: Supervision    Ambulation/Gait Assistance Details cues for increased step length and increased step height with gait throughout the session    Ambulation Distance (Feet) --   around the gym with session   Assistive device None    Gait Pattern Decreased dorsiflexion - right;Decreased dorsiflexion - left;Poor foot clearance - left;Poor foot clearance - right;Shuffle;Right foot flat;Left foot flat;Decreased trunk rotation    Ambulation Surface Level;Indoor      High Level Balance   High Level Balance Activities Side stepping;Marching forwards;Marching backwards    High Level Balance Comments red/blue mats next to counter top: 3 laps each/each way with cues for high knees with marching, especially with backwards marching and cues to lift feet with side stepping. Min guard to min assist for balance with single UE support on counter top.  Neuro Re-ed    Neuro Re-ed Details  for balance/NMR/coordination: use of floor ladder to work on increased step length and increased step height for 6 laps forward with min guard assist, cues to task needed.      Knee/Hip Exercises: Aerobic   Other Aerobic Scifit UE/LE's level 3.0 x 8 minutes with goal >/=30 rpm for strengthening, activity tolerance. Had seat in farthest position to also promote increased hip and knee exension. Pt needed cues to to keep moving and for "big" movements and to really kick his legs out each time to go through full range of motion               Balance Exercises - 04/13/20 0749      Balance Exercises: Standing   Step Over Hurdles / Cones  4 small hurdles on floor next to counter top- forward reciprocal stepping with single UE support on counter for 8 laps. cues for increased hip/knee flexion and increased step length. min guard assist for safety.               PT Short Term Goals - 03/22/20 0917      PT SHORT TERM GOAL #1   Title Pt will be independent with initial HEP in order to build upon functional gains made in therapy. ALL STGS DUE 04/19/20    Time 4    Period Weeks    Status New    Target Date 04/19/20      PT SHORT TERM GOAL #2   Title Pt will improve DGI score to at least a 15/24 in order to demo decr fall risk.    Baseline 13/24    Time 4    Period Weeks    Status New      PT SHORT TERM GOAL #3   Title Pt and pt's spouse will verbalize understanding of fall prevention in the home.    Time 4    Period Weeks    Status New      PT SHORT TERM GOAL #4   Title Pt will improve gait speed to at least 2.2 ft/sec in order to demo improved community mobility.    Baseline 1.88 ft/sec with no AD    Time 4    Period Weeks    Status New      PT SHORT TERM GOAL #5   Title Pt will perform 5x sit <> stand with BUE support in 28 seconds or less in order to demo improved functional BLE strength and decr fall risk.    Baseline 33.69 seconds    Time 4    Period Weeks    Status New             PT Long Term Goals - 03/22/20 0919      PT LONG TERM GOAL #1   Title Pt will be independent with final HEP in order to build upon functional gains made in therapy. ALL LTGS DUE 05/17/20.    Time 8    Period Weeks    Status New    Target Date 05/17/20      PT LONG TERM GOAL #2   Title Pt will improve DGI score to at least a 18/24 in order to demo decr fall risk.    Baseline 13/24    Time 8    Period Weeks    Status New      PT LONG TERM GOAL #3   Title Pt will ambulate 500' outdoors over unlevel  surfaces with supervision with no AD vs. LRAD in order to demo improved community mobility.    Time 8    Period Weeks     Status New      PT LONG TERM GOAL #4   Title Pt will improve gait speed to at least 2.5 ft/sec in order to demo improved community mobility.    Baseline 1.88 ft/sec with no AD    Time 8    Period Weeks    Status New      PT LONG TERM GOAL #5   Title Pt will perform 5x sit <> stand with BUE support in 21 seconds or less in order to demo improved functional BLE strength and decr fall risk.    Baseline 33.69 seconds    Time 8    Period Weeks    Status New                 Plan - 04/13/20 0724    Clinical Impression Statement Today's skilled session continued to focus on strengthening and gait mechanics with emphasis on increased hip/knee flexion for increased step height/lenght. Minimal carryover noted in session despite cues/activities performed. The pt should benefit form continued PT to progress toward unmet goals.    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, water;Locomotion Level    Examination-Participation Restrictions Community Activity;Driving   playing golf   Stability/Clinical Decision Making Stable/Uncomplicated    Rehab Potential Good    PT Frequency 2x / week    PT Duration 8 weeks    PT Treatment/Interventions ADLs/Self Care Home Management;DME Instruction;Gait training;Therapeutic activities;Functional mobility training;Stair training;Therapeutic exercise;Balance training;Neuromuscular re-education;Patient/family education;Vestibular;Energy conservation    PT Next Visit Plan work on foot clearance/gait mechanics with calf stretching/strengthening and hip flexor strengthening. Uneven surface balance-static and dynamic- pt has posterior LOB. warm up on [employer]. STGs cue 04/19/20    Consulted and Agree with Plan of Care Patient    Family Member Consulted pt's wife           Patient will benefit from skilled therapeutic  intervention in order to improve the following deficits and impairments:  Abnormal gait,Decreased balance,Decreased activity tolerance,Decreased coordination,Difficulty walking,Decreased strength  Visit Diagnosis: Muscle weakness (generalized)  Unsteadiness on feet  Other abnormalities of gait and mobility  Other symptoms and signs involving the nervous system     Problem List Patient Active Problem List   Diagnosis Date Noted  . Acute ischemic stroke (HCC) 03/10/2020  . 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 Chronic pain 03/09/2020    Sallyanne Kuster, PTA, [facility] [address], [address] [place], [place] [address] [phone] 04/13/20, 4:22 PM   Name: BASSAM DRESCH MRN: [id number] Date of Birth: [birth year]

## 2020-04-17 ENCOUNTER — Ambulatory Visit: Payer: Medicare (Managed Care) | Admitting: Occupational Therapy

## 2020-04-17 ENCOUNTER — Other Ambulatory Visit: Payer: Self-pay

## 2020-04-17 ENCOUNTER — Ambulatory Visit: Payer: Medicare (Managed Care) | Attending: Internal Medicine | Admitting: Physical Therapy

## 2020-04-17 ENCOUNTER — Encounter: Payer: Self-pay | Admitting: Physical Therapy

## 2020-04-17 DIAGNOSIS — R41840 Attention and concentration deficit: Secondary | ICD-10-CM | POA: Diagnosis present

## 2020-04-17 DIAGNOSIS — R41844 Frontal lobe and executive function deficit: Secondary | ICD-10-CM

## 2020-04-17 DIAGNOSIS — M6281 Muscle weakness (generalized): Secondary | ICD-10-CM

## 2020-04-17 DIAGNOSIS — R41842 Visuospatial deficit: Secondary | ICD-10-CM | POA: Insufficient documentation

## 2020-04-17 DIAGNOSIS — R2689 Other abnormalities of gait and mobility: Secondary | ICD-10-CM | POA: Diagnosis present

## 2020-04-17 DIAGNOSIS — R2681 Unsteadiness on feet: Secondary | ICD-10-CM | POA: Diagnosis present

## 2020-04-17 DIAGNOSIS — R29818 Other symptoms and signs involving the nervous system: Secondary | ICD-10-CM | POA: Insufficient documentation

## 2020-04-17 NOTE — Therapy (Signed)
[facility] [address] [place], [place], [address] Phone: [phone]   Fax:  [phone]  Occupational Therapy Treatment  Patient Details  Name: Michael Wells MRN: [id number] Date of Birth: 16-Jul-1940 Referring Provider (OT): Dr. Natale Milch (will send to PCP as pt referred by hospitalist)   Encounter Date: 04/17/2020   OT End of Session - 04/17/20 0807    Visit Number 8    Number of Visits 25    Date for OT Re-Evaluation 06/14/20    Authorization Type [employer], out of network    Authorization - Visit Number 8    Authorization - Number of Visits 10    OT Start Time 0803    OT Stop Time 0845    OT Time Calculation (min) 42 min    Activity Tolerance Patient tolerated treatment well    Behavior During Therapy [facility] for tasks assessed/performed           Past Medical History:  Diagnosis Date  . Anxiety   . Chronic pain   . H/O sleep disturbance     Past Surgical History:  Procedure Laterality Date  . IR ANGIO VERTEBRAL SEL SUBCLAVIAN INNOMINATE UNI L MOD SED  03/10/2020  . IR INTRAVSC STENT CERV CAROTID W/EMB-PROT MOD SED INCL ANGIO  03/10/2020  . IR STENT PLACEMENT ANTE CAROTID INC ANGIO  03/10/2020      . IR US GUIDE VASC ACCESS RIGHT  03/10/2020  . RADIOLOGY WITH ANESTHESIA N/A 03/10/2020   Procedure: IR WITH ANESTHESIA;  Surgeon: Radiologist, Medication, MD;  [facility]: [facility] OR;  Service: Radiology;  Laterality: N/A;    There were no vitals filed for this visit.   Subjective Assessment - 04/17/20 0803    Subjective  Denies pain    Pertinent History 80 y.o. 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    Patient Stated Goals improve balance and vision    Currently in Pain? No/denies            Copying small peg design with  mod cueing for problem-solving strategy/organization and visual scanning.    Reviewed visual HEP and pt returned demo with mod cueing and difficulty with tracking (pt with too much difficulty holding card and tracking so therapist held card).  Pt needed multiple re-directions due to decr attention (internal and external distractions).    Reviewed yellow theraband HEP.--pt needed mod cueing for technique and attention.           OT Short Term Goals - 04/17/20 0841      OT SHORT TERM GOAL #1   Title I with inital HEP    Time 4    Period Weeks    Status On-going   04/17/20:  needs mod cueing   Target Date 04/19/20      OT SHORT TERM GOAL #2   Title Pt will verbalize understanding of compensatory strategeis for visual deficits    Time 4    Period Weeks    Status On-going      OT SHORT TERM GOAL #3   Title Pt/ wife will verbalize understanding of compensatory strategeis for cognitve/memory deficits.    Time 4    Period Weeks    Status On-going      OT SHORT TERM GOAL #4   Title Pt will perform tabletop scanning activities  with  80% or better accuracy.    Time 4    Period Weeks    Status On-going      OT SHORT TERM GOAL #5   Title Pt will navigate a min distracting environment and locate items with 75% or better accuracy.    Time 4    Period Weeks    Status On-going      OT SHORT TERM GOAL #6   Title Pt will demonstrate ability to sequence a simple functional task with 90% or better accuracy in a reasonable amount of time    Baseline delayed processing    Time 4    Period Weeks    Status On-going             OT Long Term Goals - 03/29/20 1206      OT LONG TERM GOAL #1   Title I with updated HEP.    Time 12    Period Weeks    Status New      OT LONG TERM GOAL #2   Title Pt will perfom environmental scanning activities in a mod distracting environment with 90% better accuracy.    Time 12    Period Weeks      OT LONG TERM GOAL #3   Title Pt will perfrom  shower transfer and shower, at a modified independent level    Baseline pt is currently sponge bathing.    Time 12    Period Weeks    Status New      OT LONG TERM GOAL #4   Title Pt will retrieve 3 lbs fom an overhead shelf with RUE with pain no greater than 2/10    Time 12    Period Weeks    Status New      OT LONG TERM GOAL #5   Title Pt will demonstrate improved LUE fine motor coordiantion as evidenced by decreasing LUE 9 hole peg test score to 60 secs or less.    Baseline RUE 43, LUE 1 min 17 secs    Time 12    Period Weeks    Status New      OT LONG TERM GOAL #6   Title I with all basic ADLs.    Time 12    Period Weeks    Status New                 Plan - 04/17/20 0808    Clinical Impression Statement Pt continues to demo difficulty with visual perceptual activities and organization/problem solving.  Pt also with continued decr attention affecting performance and needed mod cueing for HEP review with pt easily distracted.    OT Occupational Profile and History Detailed Assessment- Review of Records and additional review of physical, cognitive, psychosocial history related to current functional performance    Occupational performance deficits (Please refer to evaluation for details): ADL's;IADL's;Leisure;Social Participation    Body Structure / Function / Physical Skills ADL;UE functional use;Balance;Pain;Gait;ROM;GMC;Coordination;Decreased knowledge of precautions;IADL;Dexterity;Mobility    Cognitive Skills Attention;Learn;Memory;Perception;Problem Solve;Safety Awareness;Sequencing;Thought;Understand    Rehab Potential Good    Clinical Decision Making Limited treatment options, no task modification necessary    Comorbidities Affecting Occupational Performance: May have comorbidities impacting occupational performance    Modification or Assistance to Complete Evaluation  No modification of tasks or assist necessary to complete eval    OT Frequency 2x / week   plus  eval, anticipate d/c after 8 weeks   OT Duration 12 weeks    OT  Treatment/Interventions Self-care/ADL training;Ultrasound;Energy conservation;Visual/perceptual remediation/compensation;Patient/family education;DME and/or AE instruction;Paraffin;Gait Training;Passive range of motion;Balance training;Fluidtherapy;Cryotherapy;Building services engineer;Therapeutic activities;Manual Therapy;Therapeutic exercise;Moist Heat;Neuromuscular education;Cognitive remediation/compensation    Plan check STGs    Consulted and Agree with Plan of Care Patient;Family member/caregiver    Family Member Consulted wife           Patient will benefit from skilled therapeutic intervention in order to improve the following deficits and impairments:   Body Structure / Function / Physical Skills: ADL,UE functional use,Balance,Pain,Gait,ROM,GMC,Coordination,Decreased knowledge of precautions,IADL,Dexterity,Mobility Cognitive Skills: Attention,Learn,Memory,Perception,Problem Solve,Safety Awareness,Sequencing,Thought,Understand     Visit Diagnosis: Visuospatial deficit  Other symptoms and signs involving the nervous system  Frontal lobe and executive function deficit  Attention and concentration deficit  Unsteadiness on feet  Muscle weakness (generalized)    Problem List Patient Active Problem List   Diagnosis Date Noted  . Acute ischemic stroke (HCC) 03/10/2020  . 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 Chronic pain 03/09/2020    [facility] 04/17/2020, 8:48 AM  [facility] [address] [place], [place], [address] Phone: [phone]   Fax:  [phone]  Name: Michael Wells MRN: [id number] Date of Birth: [birth year]   Willa Frater, OTR/L [facility] [address] [place], [place]  [address] [phone] phone [phone] 04/17/20 8:48 AM

## 2020-04-17 NOTE — Therapy (Signed)
[facility] [address] [place], [place], [address] Phone: [phone]   Fax:  [phone]  Physical Therapy Treatment  Patient Details  Name: Michael Wells MRN: [id number] Date of Birth: September 13, 1940 Referring Provider (PT): Little Ishikawa, MD   Encounter Date: 04/17/2020   PT End of Session - 04/17/20 0814    Visit Number 8    Number of Visits 17    Date for PT Re-Evaluation 06-27-2020   written for 60 day POC   Authorization Type [employer]    PT Start Time 0718    PT Stop Time 0759    PT Time Calculation (min) 41 min    Equipment Utilized During Treatment Gait belt    Activity Tolerance Patient tolerated treatment well    Behavior During Therapy [facility] for tasks assessed/performed           Past Medical History:  Diagnosis Date  . Anxiety   . Chronic pain   . H/O sleep disturbance     Past Surgical History:  Procedure Laterality Date  . IR ANGIO VERTEBRAL SEL SUBCLAVIAN INNOMINATE UNI L MOD SED  03/10/2020  . IR INTRAVSC STENT CERV CAROTID W/EMB-PROT MOD SED INCL ANGIO  03/10/2020  . IR STENT PLACEMENT ANTE CAROTID INC ANGIO  03/10/2020      . IR US GUIDE VASC ACCESS RIGHT  03/10/2020  . RADIOLOGY WITH ANESTHESIA N/A 03/10/2020   Procedure: IR WITH ANESTHESIA;  Surgeon: Radiologist, Medication, MD;  Location: [facility];  Service: Radiology;  Laterality: N/A;    There were no vitals filed for this visit.   Subjective Assessment - 04/17/20 0721    Subjective No changes, no falls.    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    Currently in Pain? No/denies                             [facility]  Adult PT Treatment/Exercise - 04/17/20 0725      Transfers   Transfers Stand to Sit;Sit to Stand    Sit to Stand 5: Supervision;With upper extremity assist;From chair/3-in-1    Five time sit to stand comments  32.22 seconds 1st attempt, 31.85 seconds 2nd attempt with UE support from chair    Stand to Sit 5: Supervision;With upper extremity assist;To chair/3-in-1      Ambulation/Gait   Ambulation/Gait Yes    Ambulation/Gait Assistance 5: Supervision    Ambulation/Gait Assistance Details cues for incr step length throughout session, pt continues with shuffled gait pattern    Ambulation Distance (Feet) --   clinic distances between activites   Assistive device None    Gait Pattern Decreased dorsiflexion - right;Decreased dorsiflexion - left;Poor foot clearance - left;Poor foot clearance - right;Shuffle;Right foot flat;Left foot flat;Decreased trunk rotation    Ambulation Surface Level;Indoor    Gait velocity 18.82 seconds = 1.74 ft/sec      Neuro Re-ed    Neuro Re-ed Details  using agility ladder - stepping one foot in each block for incr step length down and back x5 reps, min guard/min A at times for balance (pt occasionally with LOB  to the R)              Balance Exercises - 04/17/20 0001      Balance Exercises: Standing   SLS Eyes open;Solid surface;Limitations    SLS Limitations alternating toe taps to cone, x8 reps B, without UE support needing min guard/min A, couple reps with UE support    Stepping Strategy Lateral;Foam/compliant surface;UE support;Limitations    Stepping Strategy Limitations standing on blue air ex x8 reps B alternating between R and L, verbal and demo cues for proper technique    Step Over Hurdles / Cones 4 small hurdles on floor next to counter top- forward reciprocal stepping with single UE support on counter down and back x4 reps, cues for increased hip/knee flexion to clear obstacles and increased step length. min guard assist for safety.    Heel Raises  10 reps;Both   on blue air ex, cues for technique   Toe Raise Both;10 reps   on blue air ex, cues for technique   Other Standing Exercises with blue air ex: step up/up and down/down, x8 reps leading with each leg, cues for proper sequencing and to the task at hand, intermittent UE support               PT Short Term Goals - 04/17/20 0726      PT SHORT TERM GOAL #1   Title Pt will be independent with initial HEP in order to build upon functional gains made in therapy. ALL STGS DUE 04/19/20    Time 4    Period Weeks    Status New    Target Date 04/19/20      PT SHORT TERM GOAL #2   Title Pt will improve DGI score to at least a 15/24 in order to demo decr fall risk.    Baseline 13/24    Time 4    Period Weeks    Status New      PT SHORT TERM GOAL #3   Title Pt and pt's spouse will verbalize understanding of fall prevention in the home.    Time 4    Period Weeks    Status New      PT SHORT TERM GOAL #4   Title Pt will improve gait speed to at least 2.2 ft/sec in order to demo improved community mobility.    Baseline 1.88 ft/sec with no AD, 18.82 seconds = 1.74 ft/sec on 04/17/20    Time 4    Period Weeks    Status Not Met      PT SHORT TERM GOAL #5   Title Pt will perform 5x sit <> stand with BUE support in 28 seconds or less in order to demo improved functional BLE strength and decr fall risk.    Baseline 33.69 seconds; 31.85 seconds  with UE support from chair on 04/17/20    Time 4    Period Weeks    Status Not Met             PT Long Term Goals - 03/22/20 0919      PT LONG TERM GOAL #1   Title Pt will be independent with final HEP in order to build upon functional gains made in therapy. ALL LTGS DUE 05/17/20.    Time 8    Period Weeks    Status New    Target Date 05/17/20      PT LONG TERM GOAL #2   Title Pt will improve DGI score to  at least a 18/24 in order to demo decr fall risk.    Baseline 13/24    Time 8    Period Weeks    Status New      PT LONG TERM  GOAL #3   Title Pt will ambulate 500' outdoors over unlevel surfaces with supervision with no AD vs. LRAD in order to demo improved community mobility.    Time 8    Period Weeks    Status New      PT LONG TERM GOAL #4   Title Pt will improve gait speed to at least 2.5 ft/sec in order to demo improved community mobility.    Baseline 1.88 ft/sec with no AD    Time 8    Period Weeks    Status New      PT LONG TERM GOAL #5   Title Pt will perform 5x sit <> stand with BUE support in 21 seconds or less in order to demo improved functional BLE strength and decr fall risk.    Baseline 33.69 seconds    Time 8    Period Weeks    Status New                 Plan - 04/17/20 5732    Clinical Impression Statement Began to check pt's STGs today. Pt did not meet STG #4 and #5. Pt performed 5x sit <> stand in 31.85 seconds with UE support (previously 33.69 seconds). Pt's gait speed today was 1.74 ft/sec (previously 1.88 ft/sec), indicating that pt is still at a recurrent fall risk. Pt still with shuffled steps with gait and decr foot clearance. Remainder of session focused on balance strategies on compliant surfaces and for incr foot clearance. Will continue to progress towards LTGs.    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 Uneven surface balance-static and dynamic- pt has posterior LOB. warm up on Sci[employer].    Consulted and Agree with Plan of Care Patient    Family Member Consulted pt's wife           Patient will benefit from skilled therapeutic intervention in order to improve the following deficits and impairments:  Abnormal gait,Decreased balance,Decreased activity tolerance,Decreased coordination,Difficulty walking,Decreased strength  Visit Diagnosis: Muscle weakness (generalized)  Other symptoms and signs involving the nervous system  Unsteadiness on feet     Problem List Patient Active Problem List   Diagnosis Date Noted  . Acute ischemic stroke ([place]) 03/10/2020  . Embolic stroke ([place]) 20/25/4270  . Chronic pain 03/09/2020    Arliss Journey, PT, DPT  04/17/2020, 8:23 AM  [facility] [address] [place], [place], [address] Phone: [phone]   Fax:  [phone]  Name: GERVASE COLBERG MRN: [id number] Date of Birth: [birth year]

## 2020-04-20 ENCOUNTER — Other Ambulatory Visit: Payer: Self-pay

## 2020-04-20 ENCOUNTER — Ambulatory Visit: Payer: Medicare (Managed Care) | Admitting: Physical Therapy

## 2020-04-20 ENCOUNTER — Encounter: Payer: Self-pay | Admitting: Physical Therapy

## 2020-04-20 ENCOUNTER — Ambulatory Visit: Payer: Medicare (Managed Care) | Admitting: Occupational Therapy

## 2020-04-20 DIAGNOSIS — M6281 Muscle weakness (generalized): Secondary | ICD-10-CM | POA: Diagnosis not present

## 2020-04-20 DIAGNOSIS — R2689 Other abnormalities of gait and mobility: Secondary | ICD-10-CM

## 2020-04-20 DIAGNOSIS — R29818 Other symptoms and signs involving the nervous system: Secondary | ICD-10-CM

## 2020-04-20 DIAGNOSIS — R2681 Unsteadiness on feet: Secondary | ICD-10-CM

## 2020-04-20 NOTE — Therapy (Signed)
[facility] [address] [place], [place], [address] Phone: [phone]   Fax:  [phone]  Physical Therapy Treatment  Patient Details  Name: Michael Wells MRN: [id number] Date of Birth: September 10, 1940 Referring Provider (PT): Little Ishikawa, MD   Encounter Date: 04/20/2020   PT End of Session - 04/20/20 0721    Visit Number 9    Number of Visits 17    Date for PT Re-Evaluation 07-07-20   written for 60 day POC   Authorization Type [employer]    PT Start Time [phone]    PT Stop Time 0800    PT Time Calculation (min) 42 min    Equipment Utilized During Treatment Gait belt    Activity Tolerance Patient tolerated treatment well    Behavior During Therapy [facility] for tasks assessed/performed           Past Medical History:  Diagnosis Date  . Anxiety   . Chronic pain   . H/O sleep disturbance     Past Surgical History:  Procedure Laterality Date  . IR ANGIO VERTEBRAL SEL SUBCLAVIAN INNOMINATE UNI L MOD SED  03/10/2020  . IR INTRAVSC STENT CERV CAROTID W/EMB-PROT MOD SED INCL ANGIO  03/10/2020  . IR STENT PLACEMENT ANTE CAROTID INC ANGIO  03/10/2020      . IR US GUIDE VASC ACCESS RIGHT  03/10/2020  . RADIOLOGY WITH ANESTHESIA N/A 03/10/2020   Procedure: IR WITH ANESTHESIA;  Surgeon: Radiologist, Medication, MD;  Location: [facility];  Service: Radiology;  Laterality: N/A;    There were no vitals filed for this visit.   Subjective Assessment - 04/20/20 0721    Subjective No new complaints.No falls or pain to report.    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    Currently in Pain? No/denies    Pain Score 0-No pain               [facility] PT Assessment - 04/20/20 0723      Dynamic Gait Index   Level Surface Mild Impairment    Change in Gait Speed Normal    Gait with Horizontal Head Turns Normal    Gait with Vertical Head Turns Normal    Gait and Pivot Turn Mild Impairment    Step Over Obstacle Mild Impairment    Step Around Obstacles Normal    Steps Moderate Impairment   reciprocal to ascend/step to for descent   Total Score 19    DGI comment: 19/24 </= 19 is predictive of falls               [facility] Adult PT Treatment/Exercise - 04/20/20 0723      Transfers   Transfers Stand to Sit;Sit to Stand    Sit to Stand 5: Supervision;With upper extremity assist;From chair/3-in-1    Stand to Sit 5: Supervision;With upper extremity assist;To chair/3-in-1      Ambulation/Gait   Ambulation/Gait Yes    Ambulation/Gait Assistance 5: Supervision    Ambulation/Gait Assistance Details cues for increased step length, reciprocal arm swing and increased foot clearance with gait    Assistive device None    Gait Pattern Decreased dorsiflexion - right;Decreased dorsiflexion -  left;Poor foot clearance - left;Poor foot clearance - right;Shuffle;Right foot flat;Left foot flat;Decreased trunk rotation    Ambulation Surface Level;Indoor      Self-Care   Self-Care Other Self-Care Comments    Other Self-Care Comments  reviewed and provided pt with fall prevention strategies for home. Handout given to spouse in lobby as she was not in clinic/session; Discussed HEP with patient. He reports doing them with spouse assist.      Neuro Re-ed    Neuro Re-ed Details  for balance/NMR: with use of floor ladder working on increased step length and height for 4 laps with min assist and cues needed; seated at edge of mat: lateral stepping out/in over black foam beam with the beam in tall position for 5 reps each side. cues for increased hip flexion to clear beam.      Knee/Hip Exercises: Aerobic   Other Aerobic Scifit UE/LE's level 3.0 x  8 minutes with goal >/=30 rpm for strengthening, activity tolerance. Had seat in farthest position to also promote increased hip and knee exension. Pt needed cues to to keep moving and for "big" movements and to really kick his legs out each time to go through full range of motion                  PT Education - 04/20/20 2035    Education Details fall prevention strategies; progress toward goals.    Person(s) Educated Patient    Methods Explanation;Demonstration;Verbal cues;Handout    Comprehension Verbalized understanding;Returned demonstration;Verbal cues required;Need further instruction            PT Short Term Goals - 04/20/20 6063      PT SHORT TERM GOAL #1   Title Pt will be independent with initial HEP in order to build upon functional gains made in therapy. ALL STGS DUE 04/19/20    Baseline 04/20/20: met per pt report in session today    Time --    Period --    Status Achieved    Target Date 04/19/20      PT SHORT TERM GOAL #2   Title Pt will improve DGI score to at least a 15/24 in order to demo decr fall risk.    Baseline 04/20/20: met in session today with score 19/24    Time --    Period --    Status Achieved      PT SHORT TERM GOAL #3   Title Pt and pt's spouse will verbalize understanding of fall prevention in the home.    Baseline 04/20/20: issued/reviewed with pt today in session today, handout given to spouse in lobby    Time --    Period --    Status Achieved      PT SHORT TERM GOAL #4   Title Pt will improve gait speed to at least 2.2 ft/sec in order to demo improved community mobility.    Baseline 04/17/20: 18.82 seconds = 1.74 ft/sec    Status Not Met      PT SHORT TERM GOAL #5   Title Pt will perform 5x sit <> stand with BUE support in 28 seconds or less in order to demo improved functional BLE strength and decr fall risk.    Baseline 04/17/20: 31.85 seconds with UE support from chair    Status Not Met             PT Long Term Goals - 03/22/20  0919      PT LONG TERM GOAL #1  Title Pt will be independent with final HEP in order to build upon functional gains made in therapy. ALL LTGS DUE 05/17/20.    Time 8    Period Weeks    Status New    Target Date 05/17/20      PT LONG TERM GOAL #2   Title Pt will improve DGI score to at least a 18/24 in order to demo decr fall risk.    Baseline 13/24    Time 8    Period Weeks    Status New      PT LONG TERM GOAL #3   Title Pt will ambulate 500' outdoors over unlevel surfaces with supervision with no AD vs. LRAD in order to demo improved community mobility.    Time 8    Period Weeks    Status New      PT LONG TERM GOAL #4   Title Pt will improve gait speed to at least 2.5 ft/sec in order to demo improved community mobility.    Baseline 1.88 ft/sec with no AD    Time 8    Period Weeks    Status New      PT LONG TERM GOAL #5   Title Pt will perform 5x sit <> stand with BUE support in 21 seconds or less in order to demo improved functional BLE strength and decr fall risk.    Baseline 33.69 seconds    Time 8    Period Weeks    Status New                 Plan - 04/20/20 4403    Clinical Impression Statement Today's skilled session initially focused on progress toward remaining STGs with goals met. Remainder of session continued to focus on gait mechanics and strengthening. No issues noted or reported in session. The pt is progressing toward goals and should benefit from continued PT to progress toward unmet LTGs.    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;Locomotion Level    Examination-Participation Restrictions Community Activity;Driving   playing golf   Stability/Clinical Decision Making Stable/Uncomplicated    Rehab Potential Good    PT Frequency 2x / week    PT Duration 8 weeks    PT Treatment/Interventions ADLs/Self  Care Home Management;DME Instruction;Gait training;Therapeutic activities;Functional mobility training;Stair training;Therapeutic exercise;Balance training;Neuromuscular re-education;Patient/family education;Vestibular;Energy conservation    PT Next Visit Plan work on foot clearance/gait mechanics with calf stretching/strengthening and hip flexor strengthening. Uneven surface balance-static and dynamic- pt has posterior LOB. warm up on Sci[employer].    PT Home Exercise Plan Access Code: [place] and Agree with Plan of Care Patient    Family Member Consulted pt's wife           Patient will benefit from skilled therapeutic intervention in order to improve the following deficits and impairments:  Abnormal gait,Decreased balance,Decreased activity tolerance,Decreased coordination,Difficulty walking,Decreased strength  Visit Diagnosis: Unsteadiness on feet  Muscle weakness (generalized)  Other abnormalities of gait and mobility  Other symptoms and signs involving the nervous system     Problem List Patient Active Problem List   Diagnosis Date Noted  . Acute ischemic stroke ([place]) 03/10/2020  . Embolic stroke ([place]) 47/42/5956  . Chronic pain 03/09/2020    Willow Ora, PTA, [facility] [address] [place], [place] [address] [phone] 04/20/20, 9:31 PM   Name:  Michael Wells MRN: [id number] Date of Birth: 1940/12/18

## 2020-04-20 NOTE — Patient Instructions (Addendum)
Fall Prevention in the Home, Adult Falls can cause injuries and can happen to people of all ages. There are many things you can do to make your home safe and to help prevent falls. Ask for help when making these changes. What actions can I take to prevent falls? General Instructions  Use good lighting in all rooms. Replace any light bulbs that burn out.  Turn on the lights in dark areas. Use night-lights.  Keep items that you use often in easy-to-reach places. Lower the shelves around your home if needed.  Set up your furniture so you have a clear path. Avoid moving your furniture around.  Do not have throw rugs or other things on the floor that can make you trip.  Avoid walking on wet floors.  If any of your floors are uneven, fix them.  Add color or contrast paint or tape to clearly mark and help you see: ? Grab bars or handrails. ? First and last steps of staircases. ? Where the edge of each step is.  If you use a stepladder: ? Make sure that it is fully opened. Do not climb a closed stepladder. ? Make sure the sides of the stepladder are locked in place. ? Ask someone to hold the stepladder while you use it.  Know where your pets are when moving through your home. What can I do in the bathroom?  Keep the floor dry. Clean up any water on the floor right away.  Remove soap buildup in the tub or shower.  Use nonskid mats or decals on the floor of the tub or shower.  Attach bath mats securely with double-sided, nonslip rug tape.  If you need to sit down in the shower, use a plastic, nonslip stool.  Install grab bars by the toilet and in the tub and shower. Do not use towel bars as grab bars.      What can I do in the bedroom?  Make sure that you have a light by your bed that is easy to reach.  Do not use any sheets or blankets for your bed that hang to the floor.  Have a firm chair with side arms that you can use for support when you get dressed. What can I do in  the kitchen?  Clean up any spills right away.  If you need to reach something above you, use a step stool with a grab bar.  Keep electrical cords out of the way.  Do not use floor polish or wax that makes floors slippery. What can I do with my stairs?  Do not leave any items on the stairs.  Make sure that you have a light switch at the top and the bottom of the stairs.  Make sure that there are handrails on both sides of the stairs. Fix handrails that are broken or loose.  Install nonslip stair treads on all your stairs.  Avoid having throw rugs at the top or bottom of the stairs.  Choose a carpet that does not hide the edge of the steps on the stairs.  Check carpeting to make sure that it is firmly attached to the stairs. Fix carpet that is loose or worn. What can I do on the outside of my home?  Use bright outdoor lighting.  Fix the edges of walkways and driveways and fix any cracks.  Remove anything that might make you trip as you walk through a door, such as a raised step or threshold.  Trim any   bushes or trees on paths to your home.  Check to see if handrails are loose or broken and that both sides of all steps have handrails.  Install guardrails along the edges of any raised decks and porches.  Clear paths of anything that can make you trip, such as tools or rocks.  Have leaves, snow, or ice cleared regularly.  Use sand or salt on paths during winter.  Clean up any spills in your garage right away. This includes grease or oil spills. What other actions can I take?  Wear shoes that: ? Have a low heel. Do not wear high heels. ? Have rubber bottoms. ? Feel good on your feet and fit well. ? Are closed at the toe. Do not wear open-toe sandals.  Use tools that help you move around if needed. These include: ? Canes. ? Walkers. ? Scooters. ? Crutches.  Review your medicines with your doctor. Some medicines can make you feel dizzy. This can increase your chance  of falling. Ask your doctor what else you can do to help prevent falls. Where to find more information  Centers for Disease Control and Prevention, [employer]: FootballExhibition.com.br  [employer] on Aging: https://walker.com/ Contact a doctor if:  You are afraid of falling at home.  You feel weak, drowsy, or dizzy at home.  You fall at home. Summary  There are many simple things that you can do to make your home safe and to help prevent falls.  Ways to make your home safe include removing things that can make you trip and installing grab bars in the bathroom.  Ask for help when making these changes in your home. This information is not intended to replace advice given to you by your health care provider. Make sure you discuss any questions you have with your health care provider. Document Revised: 08/04/2019 Document Reviewed: 08/04/2019 Elsevier Patient Education  2021 Elsevier Inc.   Access Code: [place] URL: [email or web address] Date: 04/20/2020 Prepared by: Sallyanne Kuster  Exercises Sit to Stand with Counter Support - 1 x daily - 5 x weekly - 1 sets - 10 reps Seated Knee Extension with Resistance - 1 x daily - 5 x weekly - 1 sets - 10 reps Seated Hip Abduction with Resistance - 1 x daily - 5 x weekly - 1 sets - 10 reps Heel Toe Raises with Counter Support - 1 x daily - 5 x weekly - 1 sets - 10 reps Standing Hip Abduction with Counter Support - 1 x daily - 5 x weekly - 1 sets - 10 reps

## 2020-04-25 ENCOUNTER — Encounter: Payer: Self-pay | Admitting: Physical Therapy

## 2020-04-25 ENCOUNTER — Ambulatory Visit: Payer: Medicare (Managed Care) | Admitting: Occupational Therapy

## 2020-04-25 ENCOUNTER — Ambulatory Visit: Payer: Medicare (Managed Care) | Admitting: Physical Therapy

## 2020-04-25 ENCOUNTER — Other Ambulatory Visit: Payer: Self-pay

## 2020-04-25 DIAGNOSIS — R41840 Attention and concentration deficit: Secondary | ICD-10-CM

## 2020-04-25 DIAGNOSIS — R2681 Unsteadiness on feet: Secondary | ICD-10-CM

## 2020-04-25 DIAGNOSIS — R2689 Other abnormalities of gait and mobility: Secondary | ICD-10-CM

## 2020-04-25 DIAGNOSIS — R29818 Other symptoms and signs involving the nervous system: Secondary | ICD-10-CM

## 2020-04-25 DIAGNOSIS — M6281 Muscle weakness (generalized): Secondary | ICD-10-CM

## 2020-04-25 DIAGNOSIS — R41842 Visuospatial deficit: Secondary | ICD-10-CM

## 2020-04-25 DIAGNOSIS — R41844 Frontal lobe and executive function deficit: Secondary | ICD-10-CM

## 2020-04-25 NOTE — Therapy (Signed)
[facility] [address] [place], [place], [address] Phone: [phone]   Fax:  [phone]  Occupational Therapy Treatment  Patient Details  Name: Michael Wells MRN: [id number] Date of Birth: 1940/06/23 Referring Provider (OT): Dr. Avon Gully (will send to PCP as pt referred by hospitalist)   Encounter Date: 04/25/2020   OT End of Session - 04/25/20 0836    Visit Number 9    Number of Visits 25    Date for OT Re-Evaluation 06/14/20    Authorization Type [employer], out of network    Authorization - Visit Number 9    Authorization - Number of Visits 10    Progress Note Due on Visit 19    OT Start Time 0805    OT Stop Time 0844    OT Time Calculation (min) 39 min    Activity Tolerance Patient tolerated treatment well    Behavior During Therapy [facility] for tasks assessed/performed           Past Medical History:  Diagnosis Date  . Anxiety   . Chronic pain   . H/O sleep disturbance     Past Surgical History:  Procedure Laterality Date  . IR ANGIO VERTEBRAL SEL SUBCLAVIAN INNOMINATE UNI L MOD SED  03/10/2020  . IR INTRAVSC STENT CERV CAROTID W/EMB-PROT MOD SED INCL ANGIO  03/10/2020  . IR STENT PLACEMENT ANTE CAROTID INC ANGIO  03/10/2020      . IR US GUIDE VASC ACCESS RIGHT  03/10/2020  . RADIOLOGY WITH ANESTHESIA N/A 03/10/2020   Procedure: IR WITH ANESTHESIA;  Surgeon: Radiologist, Medication, MD;  Location: [facility] [facility];  Service: Radiology;  Laterality: N/A;    There were no vitals filed for this visit.   Subjective Assessment - 04/25/20 0806    Subjective  Denies pain    Pertinent History 80 y.o. 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    Patient Stated Goals improve balance and vision    Currently in Pain? No/denies              Treatment: Tabletop scanning number cancellation 31M with 100% accuracy, number copying task with only 1 error. Environmental scanning task to locate items in min distracting environment, 70%. Pt needs reinforcement of visual strategies. Sequencing functional task on constant therapy 81% accuracy, min v.c and increased time                      OT Short Term Goals - 04/25/20 0839      OT SHORT TERM GOAL #1   Title I with inital HEP    Time 4    Period Weeks    Status Partially Met   04/25/20-Pt can complete with wife's assist   Target Date 04/19/20      OT SHORT TERM GOAL #2   Title Pt will verbalize understanding of compensatory strategeis for visual deficits    Time 4    Period Weeks    Status On-going   needs reinforcement     OT SHORT TERM GOAL #3   Title Pt/ wife will verbalize understanding of compensatory strategeis for cognitve/memory deficits.    Time 4    Period Weeks    Status On-going   needs reinforcement     OT SHORT TERM GOAL #4   Title Pt  will perform tabletop scanning activities with  80% or better accuracy.    Time 4    Period Weeks    Status Achieved   90-100% for basic today     OT SHORT TERM GOAL #5   Title Pt will navigate a min distracting environment and locate items with 75% or better accuracy.    Time 4    Period Weeks    Status On-going   70%     OT SHORT TERM GOAL #6   Title Pt will demonstrate ability to sequence a simple functional task with 90% or better accuracy in a reasonable amount of time    Baseline delayed processing    Time 4    Period Weeks    Status On-going   81 % on constant therapy            OT Long Term Goals - 04/25/20 0835      OT LONG TERM GOAL #1   Title I with updated HEP.    Time 12    Period Weeks    Status On-going      OT LONG TERM GOAL #2   Title Pt will perfom environmental scanning activities in a mod distracting environment with 90% better accuracy.    Time 12    Period  Weeks    Status On-going      OT LONG TERM GOAL #3   Title Pt will perfrom shower transfer and shower, at a modified independent level    Baseline pt is currently sponge bathing.    Time 12    Period Weeks    Status On-going      OT LONG TERM GOAL #4   Title Pt will retrieve 3 lbs fom an overhead shelf with RUE with pain no greater than 2/10    Time 12    Period Weeks    Status On-going      OT LONG TERM GOAL #5   Title Pt will demonstrate improved LUE fine motor coordiantion as evidenced by decreasing LUE 9 hole peg test score to 60 secs or less.    Baseline RUE 43, LUE 1 min 17 secs    Time 12    Period Weeks    Status On-going      OT LONG TERM GOAL #6   Title I with all basic ADLs.    Time 12    Period Weeks    Status On-going                 Plan - 04/25/20 0840    Clinical Impression Statement For the reporting period of 03/22/20-04/25/20 Pt has met 1/5 long term goals, he is progressing yet remains limited by short term memory deficits.Pt is progressing towards goals with improving tabletop scanning and attention. Pt can benefit from skilled OT to address cognition, visual perceptual skills, coordiantion and strength in order to maximize pt's safety and I with ADLs/ IADLS. He will benefit from reinforcement of strategies for vision/ cognition and pt/ family education.    OT Occupational Profile and History Detailed Assessment- Review of Records and additional review of physical, cognitive, psychosocial history related to current functional performance    Occupational performance deficits (Please refer to evaluation for details): ADL's;IADL's;Leisure;Social Participation    Body Structure / Function / Physical Skills ADL;UE functional use;Balance;Pain;Gait;ROM;GMC;Coordination;Decreased knowledge of precautions;IADL;Dexterity;Mobility    Cognitive Skills Attention;Learn;Memory;Perception;Problem Solve;Safety Awareness;Sequencing;Thought;Understand    Rehab Potential  Good    Clinical Decision Making Limited treatment  options, no task modification necessary    Comorbidities Affecting Occupational Performance: May have comorbidities impacting occupational performance    Modification or Assistance to Complete Evaluation  No modification of tasks or assist necessary to complete eval    OT Frequency 2x / week   plus eval, anticipate d/c after 8 weeks   OT Duration 12 weeks    OT Treatment/Interventions Self-care/ADL training;Ultrasound;Energy conservation;Visual/perceptual remediation/compensation;Patient/family education;DME and/or AE instruction;Paraffin;Gait Training;Passive range of motion;Balance training;Fluidtherapy;Cryotherapy;Therapist, nutritional;Therapeutic activities;Manual Therapy;Therapeutic exercise;Moist Heat;Neuromuscular education;Cognitive remediation/compensation    Plan yellow theraband exercises from HEP, coordination activities, environmental scanning    Consulted and Agree with Plan of Care Patient;Family member/caregiver    Family Member Consulted wife           Patient will benefit from skilled therapeutic intervention in order to improve the following deficits and impairments:   Body Structure / Function / Physical Skills: ADL,UE functional use,Balance,Pain,Gait,ROM,GMC,Coordination,Decreased knowledge of precautions,IADL,Dexterity,Mobility Cognitive Skills: Attention,Learn,Memory,Perception,Problem Solve,Safety Awareness,Sequencing,Thought,Understand     Visit Diagnosis: Muscle weakness (generalized)  Other symptoms and signs involving the nervous system  Visuospatial deficit  Frontal lobe and executive function deficit  Attention and concentration deficit    Problem List Patient Active Problem List   Diagnosis Date Noted  . Acute ischemic stroke ([place]) 03/10/2020  . Embolic stroke ([place]) 25/42/7062  . Chronic pain 03/09/2020    Tzipporah Nagorski 04/25/2020, 10:42 AM  [facility] [address] [place], [place], [address] Phone: [phone]   Fax:  [phone]  Name: Michael Wells MRN: [id number] Date of Birth: Jul 20, 1940

## 2020-04-25 NOTE — Therapy (Signed)
[facility] [address] [place], [place], [address] Phone: [phone]   Fax:  [phone]  Physical Therapy Treatment  Patient Details  Name: Michael Wells MRN: [id number] Date of Birth: 07-24-1940 Referring Provider (PT): Little Ishikawa, MD   Encounter Date: 04/25/2020   PT End of Session - 04/25/20 0724    Visit Number 10    Number of Visits 17    Date for PT Re-Evaluation 07/01/2020   written for 60 day POC   Authorization Type [employer]    PT Start Time 0720    PT Stop Time 0800    PT Time Calculation (min) 40 min    Equipment Utilized During Treatment Gait belt    Activity Tolerance Patient tolerated treatment well    Behavior During Therapy [name] for tasks assessed/performed           Past Medical History:  Diagnosis Date  . Anxiety   . Chronic pain   . H/O sleep disturbance     Past Surgical History:  Procedure Laterality Date  . IR ANGIO VERTEBRAL SEL SUBCLAVIAN INNOMINATE UNI L MOD SED  03/10/2020  . IR INTRAVSC STENT CERV CAROTID W/EMB-PROT MOD SED INCL ANGIO  03/10/2020  . IR STENT PLACEMENT ANTE CAROTID INC ANGIO  03/10/2020      . IR US GUIDE VASC ACCESS RIGHT  03/10/2020  . RADIOLOGY WITH ANESTHESIA N/A 03/10/2020   Procedure: IR WITH ANESTHESIA;  Surgeon: Radiologist, Medication, MD;  Location: [facility];  Service: Radiology;  Laterality: N/A;    There were no vitals filed for this visit.   Subjective Assessment - 04/25/20 0724    Subjective No new complaints.No falls or pain to report.    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    Currently in Pain? No/denies    Pain Score 0-No pain                [facility] Adult PT Treatment/Exercise - 04/25/20 0725      Transfers   Transfers Stand to Sit;Sit to Stand    Sit to Stand 5: Supervision;With upper extremity assist;From chair/3-in-1    Stand to Sit 5: Supervision;With upper extremity assist;To chair/3-in-1      Ambulation/Gait   Ambulation/Gait Yes    Ambulation/Gait Assistance 5: Supervision    Ambulation/Gait Assistance Details around gym with session    Assistive device None    Gait Pattern Decreased dorsiflexion - right;Decreased dorsiflexion - left;Poor foot clearance - left;Poor foot clearance - right;Shuffle;Right foot flat;Left foot flat;Decreased trunk rotation    Ambulation Surface Level;Indoor      High Level Balance   High Level Balance Activities Side stepping;Marching forwards;Backward walking;Tandem walking   tandem forward/backwards   High Level Balance Comments red/blue mats next to counter top: 3 laps each/each way with cues for posture, correct ex form/technique. high marching and to lift feet with side stepping.      Neuro Re-ed    Neuro Re-ed Details  for balance/muscle re-ed: use of floor ladder to work on increased step length/height with min guard to min assist, cues for technique with 8 laps performed.      Knee/Hip Exercises:  Aerobic   Other Aerobic Scifit UE/LE's level 3.0 x 8 minutes with goal >/=30 rpm for strengthening, activity tolerance. Had seat in farthest position to also promote increased hip and knee exension. Pt needed cues to to keep moving and for "big" movements and to really kick his legs out each time to go through full range of motion               Balance Exercises - 04/25/20 0747      Balance Exercises: Standing   Step Over Hurdles / Cones 4 small hurdles on red/blue mats next to counter: forward reciprocal stepping for 6 laps, then lateral stepping for 3 laps each way wiht UE support, cues for increased step height/lenght, step placement and posture.               PT  Short Term Goals - 04/20/20 6333      PT SHORT TERM GOAL #1   Title Pt will be independent with initial HEP in order to build upon functional gains made in therapy. ALL STGS DUE 04/19/20    Baseline 04/20/20: met per pt report in session today    Time --    Period --    Status Achieved    Target Date 04/19/20      PT SHORT TERM GOAL #2   Title Pt will improve DGI score to at least a 15/24 in order to demo decr fall risk.    Baseline 04/20/20: met in session today with score 19/24    Time --    Period --    Status Achieved      PT SHORT TERM GOAL #3   Title Pt and pt's spouse will verbalize understanding of fall prevention in the home.    Baseline 04/20/20: issued/reviewed with pt today in session today, handout given to spouse in lobby    Time --    Period --    Status Achieved      PT SHORT TERM GOAL #4   Title Pt will improve gait speed to at least 2.2 ft/sec in order to demo improved community mobility.    Baseline 04/17/20: 18.82 seconds = 1.74 ft/sec    Status Not Met      PT SHORT TERM GOAL #5   Title Pt will perform 5x sit <> stand with BUE support in 28 seconds or less in order to demo improved functional BLE strength and decr fall risk.    Baseline 04/17/20: 31.85 seconds with UE support from chair    Status Not Met             PT Long Term Goals - 03/22/20 0919      PT LONG TERM GOAL #1   Title Pt will be independent with final HEP in order to build upon functional gains made in therapy. ALL LTGS DUE 05/17/20.    Time 8    Period Weeks    Status New    Target Date 05/17/20      PT LONG TERM GOAL #2   Title Pt will improve DGI score to at least a 18/24 in order to demo decr fall risk.    Baseline 13/24    Time 8    Period Weeks    Status New      PT LONG TERM GOAL #3   Title Pt will ambulate 500' outdoors over unlevel surfaces with supervision with no AD vs. LRAD in order to demo improved community mobility.  Time 8    Period Weeks    Status New      PT LONG  TERM GOAL #4   Title Pt will improve gait speed to at least 2.5 ft/sec in order to demo improved community mobility.    Baseline 1.88 ft/sec with no AD    Time 8    Period Weeks    Status New      PT LONG TERM GOAL #5   Title Pt will perform 5x sit <> stand with BUE support in 21 seconds or less in order to demo improved functional BLE strength and decr fall risk.    Baseline 33.69 seconds    Time 8    Period Weeks    Status New                 Plan - 04/25/20 0725    Clinical Impression Statement Today's skilled session continued to focus on strengthening, gait training and balance training. Minimal carryover noted for increased step length/height with gait as pt continues to need constant reminder cues. Pt is able to increase both of these with activiites, just does not carryover with gait. Will benefit from continued PT to continue to address gait/strengtheing/balanct toward goals.    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;Locomotion Level    Examination-Participation Restrictions Community Activity;Driving   playing golf   Stability/Clinical Decision Making Stable/Uncomplicated    Rehab Potential Good    PT Frequency 2x / week    PT Duration 8 weeks    PT Treatment/Interventions ADLs/Self Care Home Management;DME Instruction;Gait training;Therapeutic activities;Functional mobility training;Stair training;Therapeutic exercise;Balance training;Neuromuscular re-education;Patient/family education;Vestibular;Energy conservation    PT Next Visit Plan work on foot clearance/gait mechanics with calf stretching/strengthening and hip flexor strengthening. Uneven surface balance-static and dynamic- pt has posterior LOB. warm up on [employer].    PT Home Exercise Plan Access Code: [place] and Agree with Plan of Care Patient     Family Member Consulted pt's wife           Patient will benefit from skilled therapeutic intervention in order to improve the following deficits and impairments:  Abnormal gait,Decreased balance,Decreased activity tolerance,Decreased coordination,Difficulty walking,Decreased strength  Visit Diagnosis: Unsteadiness on feet  Muscle weakness (generalized)  Other abnormalities of gait and mobility     Problem List Patient Active Problem List   Diagnosis Date Noted  . Acute ischemic stroke ([place]) 03/10/2020  . Embolic stroke ([place]) 60/63/0160  . Chronic pain 03/09/2020    Willow Ora, PTA, [facility] [address] [place], [place] [address] [phone] 04/25/20, 3:14 PM   Name: Michael Wells MRN: [id number] Date of Birth: July 25, 1940

## 2020-04-26 ENCOUNTER — Ambulatory Visit: Payer: Medicare (Managed Care) | Admitting: Physical Therapy

## 2020-04-26 ENCOUNTER — Ambulatory Visit: Payer: Medicare (Managed Care) | Admitting: Occupational Therapy

## 2020-04-26 ENCOUNTER — Encounter: Payer: Self-pay | Admitting: Physical Therapy

## 2020-04-26 DIAGNOSIS — R41840 Attention and concentration deficit: Secondary | ICD-10-CM

## 2020-04-26 DIAGNOSIS — R29818 Other symptoms and signs involving the nervous system: Secondary | ICD-10-CM

## 2020-04-26 DIAGNOSIS — M6281 Muscle weakness (generalized): Secondary | ICD-10-CM | POA: Diagnosis not present

## 2020-04-26 DIAGNOSIS — R2681 Unsteadiness on feet: Secondary | ICD-10-CM

## 2020-04-26 DIAGNOSIS — R41842 Visuospatial deficit: Secondary | ICD-10-CM

## 2020-04-26 NOTE — Therapy (Signed)
[facility] [address] [place], [place], [address] Phone: [phone]   Fax:  [phone]  Physical Therapy Treatment  Patient Details  Name: Michael Wells MRN: [id number] Date of Birth: October 26, 1940 Referring Provider (PT): Little Ishikawa, MD   Encounter Date: 04/26/2020   PT End of Session - 04/26/20 0938    Visit Number 11    Number of Visits 17    Date for PT Re-Evaluation 06-23-20   written for 60 day POC   Authorization Type [employer]    PT Start Time 0848    PT Stop Time 0928    PT Time Calculation (min) 40 min    Equipment Utilized During Treatment Gait belt    Activity Tolerance Patient tolerated treatment well    Behavior During Therapy [facility] for tasks assessed/performed           Past Medical History:  Diagnosis Date  . Anxiety   . Chronic pain   . H/O sleep disturbance     Past Surgical History:  Procedure Laterality Date  . IR ANGIO VERTEBRAL SEL SUBCLAVIAN INNOMINATE UNI L MOD SED  03/10/2020  . IR INTRAVSC STENT CERV CAROTID W/EMB-PROT MOD SED INCL ANGIO  03/10/2020  . IR STENT PLACEMENT ANTE CAROTID INC ANGIO  03/10/2020      . IR US GUIDE VASC ACCESS RIGHT  03/10/2020  . RADIOLOGY WITH ANESTHESIA N/A 03/10/2020   Procedure: IR WITH ANESTHESIA;  Surgeon: Radiologist, Medication, MD;  Location: [facility];  Service: Radiology;  Laterality: N/A;    There were no vitals filed for this visit.   Subjective Assessment - 04/26/20 0850    Subjective Nothing new since he was last here.    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    Currently in Pain? No/denies                              [facility] Adult PT Treatment/Exercise - 04/26/20 0851      Ambulation/Gait   Ambulation/Gait Yes    Ambulation/Gait Assistance 5: Supervision    Ambulation/Gait Assistance Details into clinic and between activities, cues for heel strike and incr step length    Assistive device None    Gait Pattern Decreased dorsiflexion - right;Decreased dorsiflexion - left;Poor foot clearance - left;Poor foot clearance - right;Shuffle;Right foot flat;Left foot flat;Decreased trunk rotation    Ambulation Surface Level;Indoor      Knee/Hip Exercises: Aerobic   Other Aerobic Scifit with BLE/BUE at level 2.5 x 6 minutes with goal >/=30 rpm for strengthening, activity tolerance. Pt needed cues to to keep moving and for "big" movements and to really kick his legs out each time to go through full range of motion. Therapist needing to assist move arm holds to help assist with bigger ROM               Balance Exercises - 04/26/20 0001      Balance Exercises: Standing   Standing Eyes Opened Foam/compliant surface    Standing Eyes Opened Limitations on blue air ex, feet hip width distance 2 x 10 repes head turns, 2  x 10 reps head nods - needing max verbal cues for technique and paying attention to the task at hand    Stepping Strategy Anterior;Posterior    Stepping Strategy Limitations alternating stepping over 4" foam beam x10 reps B, posterior stepping strategy on foam with BUE support x10 reps, alternating lateral stepping over 4" beam x8 reps B -needing verbal cues for proper technique and sequencing    Sidestepping Foam/compliant support;Limitations;2 reps    Sidestepping Limitations on blue mat, down and back x2 reps in // bars without UE support, max verbal and tactile cues to keep body sideways    Marching Foam/compliant surface;Forwards;Static;10 reps    Marching Limitations performing first static with cue of lifting legs up to boomwhacker x10 reps B, then marching  gait down and back x2 reps with same cue to lift legs high enough to tap boomwhacker    Heel Raises 10 reps;Both   on blue air ex   Toe Raise Both;10 reps   on blue air ex   Sit to Stand Standard surface;Upper extremity support;Foam/compliant surface;Limitations    Sit to Stand Limitations x10 reps sit <> stands on air ex, cues for posture and slowed descent, pt needing UE support, intermittent min A at times due to posterior LOB               PT Short Term Goals - 04/20/20 6073      PT SHORT TERM GOAL #1   Title Pt will be independent with initial HEP in order to build upon functional gains made in therapy. ALL STGS DUE 04/19/20    Baseline 04/20/20: met per pt report in session today    Time --    Period --    Status Achieved    Target Date 04/19/20      PT SHORT TERM GOAL #2   Title Pt will improve DGI score to at least a 15/24 in order to demo decr fall risk.    Baseline 04/20/20: met in session today with score 19/24    Time --    Period --    Status Achieved      PT SHORT TERM GOAL #3   Title Pt and pt's spouse will verbalize understanding of fall prevention in the home.    Baseline 04/20/20: issued/reviewed with pt today in session today, handout given to spouse in lobby    Time --    Period --    Status Achieved      PT SHORT TERM GOAL #4   Title Pt will improve gait speed to at least 2.2 ft/sec in order to demo improved community mobility.    Baseline 04/17/20: 18.82 seconds = 1.74 ft/sec    Status Not Met      PT SHORT TERM GOAL #5   Title Pt will perform 5x sit <> stand with BUE support in 28 seconds or less in order to demo improved functional BLE strength and decr fall risk.    Baseline 04/17/20: 31.85 seconds with UE support from chair    Status Not Met             PT Long Term Goals - 03/22/20 0919      PT LONG TERM GOAL #1   Title Pt will be independent with final HEP in order to build upon functional gains made in therapy. ALL LTGS DUE 05/17/20.    Time 8     Period Weeks    Status New    Target Date  05/17/20      PT LONG TERM GOAL #2   Title Pt will improve DGI score to at least a 18/24 in order to demo decr fall risk.    Baseline 13/24    Time 8    Period Weeks    Status New      PT LONG TERM GOAL #3   Title Pt will ambulate 500' outdoors over unlevel surfaces with supervision with no AD vs. LRAD in order to demo improved community mobility.    Time 8    Period Weeks    Status New      PT LONG TERM GOAL #4   Title Pt will improve gait speed to at least 2.5 ft/sec in order to demo improved community mobility.    Baseline 1.88 ft/sec with no AD    Time 8    Period Weeks    Status New      PT LONG TERM GOAL #5   Title Pt will perform 5x sit <> stand with BUE support in 21 seconds or less in order to demo improved functional BLE strength and decr fall risk.    Baseline 33.69 seconds    Time 8    Period Weeks    Status New                 Plan - 04/26/20 0939    Clinical Impression Statement Continued to focus on strengthening and balance training. Pt continues to walk with decr step length and step height and needs constant cues for step length and heel strike during gait - poor carryover after activities and between sessions. Pt needs cues for proper sequencing with stepping and to pay attention to the task at hand. Will continue to progress towards LTGs.    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;Locomotion Level    Examination-Participation Restrictions Community Activity;Driving   playing golf   Stability/Clinical Decision Making Stable/Uncomplicated    Rehab Potential Good    PT Frequency 2x / week    PT Duration 8 weeks    PT Treatment/Interventions ADLs/Self Care Home Management;DME Instruction;Gait training;Therapeutic activities;Functional mobility training;Stair  training;Therapeutic exercise;Balance training;Neuromuscular re-education;Patient/family education;Vestibular;Energy conservation    PT Next Visit Plan work on foot clearance/gait mechanics with calf stretching/strengthening and hip flexor strengthening. Uneven surface balance-static and dynamic- pt has posterior LOB. warm up on [employer]. possible early D/C due to lack of progress/carryover?    PT Home Exercise Plan Access Code: [place] and Agree with Plan of Care Patient    Family Member Consulted pt's wife           Patient will benefit from skilled therapeutic intervention in order to improve the following deficits and impairments:  Abnormal gait,Decreased balance,Decreased activity tolerance,Decreased coordination,Difficulty walking,Decreased strength  Visit Diagnosis: Muscle weakness (generalized)  Other symptoms and signs involving the nervous system  Unsteadiness on feet     Problem List Patient Active Problem List   Diagnosis Date Noted  . Acute ischemic stroke ([place]) 03/10/2020  . Embolic stroke ([place]) 77/41/2878  . Chronic pain 03/09/2020    Arliss Journey, PT, DPT  04/26/2020, 9:41 AM  [facility] [address] [place], [place], [address] Phone: [phone]   Fax:  [phone]  Name: Michael Wells MRN: [id number] Date of Birth: 11-Apr-1940

## 2020-04-26 NOTE — Therapy (Signed)
[facility] [address] [place], [place], [address] Phone: [phone]   Fax:  [phone]  Occupational Therapy Treatment  Patient Details  Name: Michael Wells MRN: [id number] Date of Birth: July 11, 1940 Referring Provider (OT): Dr. Avon Gully (will send to PCP as pt referred by hospitalist)   Encounter Date: 04/26/2020   OT End of Session - 04/26/20 0829    Visit Number 10    Number of Visits 25    Date for OT Re-Evaluation 06/14/20    Authorization Type [employer], out of network    Authorization - Visit Number --    Authorization - Number of Visits 10    Progress Note Due on Visit 19   progress note completed on visit 9   OT Start Time 0802    OT Stop Time 0845    OT Time Calculation (min) 43 min    Activity Tolerance Patient tolerated treatment well    Behavior During Therapy [facility] for tasks assessed/performed           Past Medical History:  Diagnosis Date  . Anxiety   . Chronic pain   . H/O sleep disturbance     Past Surgical History:  Procedure Laterality Date  . IR ANGIO VERTEBRAL SEL SUBCLAVIAN INNOMINATE UNI L MOD SED  03/10/2020  . IR INTRAVSC STENT CERV CAROTID W/EMB-PROT MOD SED INCL ANGIO  03/10/2020  . IR STENT PLACEMENT ANTE CAROTID INC ANGIO  03/10/2020      . IR US GUIDE VASC ACCESS RIGHT  03/10/2020  . RADIOLOGY WITH ANESTHESIA N/A 03/10/2020   Procedure: IR WITH ANESTHESIA;  Surgeon: Radiologist, Medication, MD;  Location: [facility];  Service: Radiology;  Laterality: N/A;    There were no vitals filed for this visit.   Subjective Assessment - 04/26/20 0804    Subjective  Sometimes I get pain in my back    Pertinent History 80 y.o. 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    Patient Stated Goals improve balance  and vision    Currently in Pain? No/denies                        OT Treatments/Exercises (OP) - 04/26/20 0001      Exercises   Exercises Shoulder      Shoulder Exercises: ROM/Strengthening   Other ROM/Strengthening Exercises Reviewed theraband HEP w/ yellow resistance band - pt required max cueing to perform correctly d/t cognitive deficits. Pt does w/ wife assistance at home      Visual/Perceptual Exercises   Other Exercises 12 pc puzzle (elephant) w/ mod cueing and assist required to complete puzzle. Pt required extra time and max difficulty. Reviewed visual scanning strategies and organized scanning pattern                    OT Short Term Goals - 04/25/20 0839      OT SHORT TERM GOAL #1   Title I with inital HEP    Time 4    Period Weeks    Status Partially Met   04/25/20-Pt can complete with wife's assist   Target Date 04/19/20      OT SHORT TERM GOAL #2   Title Pt will verbalize understanding of compensatory strategeis for visual deficits    Time 4  Period Weeks    Status On-going   needs reinforcement     OT SHORT TERM GOAL #3   Title Pt/ wife will verbalize understanding of compensatory strategeis for cognitve/memory deficits.    Time 4    Period Weeks    Status On-going   needs reinforcement     OT SHORT TERM GOAL #4   Title Pt will perform tabletop scanning activities with  80% or better accuracy.    Time 4    Period Weeks    Status Achieved   90-100% for basic today     OT SHORT TERM GOAL #5   Title Pt will navigate a min distracting environment and locate items with 75% or better accuracy.    Time 4    Period Weeks    Status On-going   70%     OT SHORT TERM GOAL #6   Title Pt will demonstrate ability to sequence a simple functional task with 90% or better accuracy in a reasonable amount of time    Baseline delayed processing    Time 4    Period Weeks    Status On-going   81 % on constant therapy            OT Long  Term Goals - 04/25/20 0835      OT LONG TERM GOAL #1   Title I with updated HEP.    Time 12    Period Weeks    Status On-going      OT LONG TERM GOAL #2   Title Pt will perfom environmental scanning activities in a mod distracting environment with 90% better accuracy.    Time 12    Period Weeks    Status On-going      OT LONG TERM GOAL #3   Title Pt will perfrom shower transfer and shower, at a modified independent level    Baseline pt is currently sponge bathing.    Time 12    Period Weeks    Status On-going      OT LONG TERM GOAL #4   Title Pt will retrieve 3 lbs fom an overhead shelf with RUE with pain no greater than 2/10    Time 12    Period Weeks    Status On-going      OT LONG TERM GOAL #5   Title Pt will demonstrate improved LUE fine motor coordiantion as evidenced by decreasing LUE 9 hole peg test score to 60 secs or less.    Baseline RUE 43, LUE 1 min 17 secs    Time 12    Period Weeks    Status On-going      OT LONG TERM GOAL #6   Title I with all basic ADLs.    Time 12    Period Weeks    Status On-going                 Plan - 04/26/20 0850    Clinical Impression Statement Pt remains limited by cognition and visual perceptual skills and would benefit from wife attending sessions    OT Occupational Profile and History Detailed Assessment- Review of Records and additional review of physical, cognitive, psychosocial history related to current functional performance    Occupational performance deficits (Please refer to evaluation for details): ADL's;IADL's;Leisure;Social Participation    Body Structure / Function / Physical Skills ADL;UE functional use;Balance;Pain;Gait;ROM;GMC;Coordination;Decreased knowledge of precautions;IADL;Dexterity;Mobility    Cognitive Skills Attention;Learn;Memory;Perception;Problem Solve;Safety Awareness;Sequencing;Thought;Understand    Rehab Potential Good  Clinical Decision Making Limited treatment options, no task  modification necessary    Comorbidities Affecting Occupational Performance: May have comorbidities impacting occupational performance    Modification or Assistance to Complete Evaluation  No modification of tasks or assist necessary to complete eval    OT Frequency 2x / week   plus eval, anticipate d/c after 8 weeks   OT Duration 12 weeks    OT Treatment/Interventions Self-care/ADL training;Ultrasound;Energy conservation;Visual/perceptual remediation/compensation;Patient/family education;DME and/or AE instruction;Paraffin;Gait Training;Passive range of motion;Balance training;Fluidtherapy;Cryotherapy;Therapist, nutritional;Therapeutic activities;Manual Therapy;Therapeutic exercise;Moist Heat;Neuromuscular education;Cognitive remediation/compensation    Plan visual scanning, ? simple PVC pipe design, coordination activities    Consulted and Agree with Plan of Care Patient;Family member/caregiver    Family Member Consulted wife           Patient will benefit from skilled therapeutic intervention in order to improve the following deficits and impairments:   Body Structure / Function / Physical Skills: ADL,UE functional use,Balance,Pain,Gait,ROM,GMC,Coordination,Decreased knowledge of precautions,IADL,Dexterity,Mobility Cognitive Skills: Attention,Learn,Memory,Perception,Problem Solve,Safety Awareness,Sequencing,Thought,Understand     Visit Diagnosis: Muscle weakness (generalized)  Visuospatial deficit  Attention and concentration deficit    Problem List Patient Active Problem List   Diagnosis Date Noted  . Acute ischemic stroke ([place]) 03/10/2020  . Embolic stroke ([place]) 71/42/3200  . Chronic pain 03/09/2020    Carey Bullocks, OTR/L 04/26/2020, 8:52 AM  [facility] [address] [place], [place], [address] Phone: [phone]   Fax:  [phone]  Name: Michael Wells MRN: [id number] Date of Birth:  March 30, 1940

## 2020-05-01 ENCOUNTER — Ambulatory Visit: Payer: Medicare (Managed Care) | Admitting: Physical Therapy

## 2020-05-03 ENCOUNTER — Ambulatory Visit: Payer: Medicare (Managed Care) | Admitting: Occupational Therapy

## 2020-05-03 ENCOUNTER — Encounter: Payer: Self-pay | Admitting: Occupational Therapy

## 2020-05-03 ENCOUNTER — Ambulatory Visit: Payer: Medicare (Managed Care)

## 2020-05-03 ENCOUNTER — Other Ambulatory Visit: Payer: Self-pay

## 2020-05-03 DIAGNOSIS — M6281 Muscle weakness (generalized): Secondary | ICD-10-CM

## 2020-05-03 DIAGNOSIS — R41844 Frontal lobe and executive function deficit: Secondary | ICD-10-CM

## 2020-05-03 DIAGNOSIS — R41842 Visuospatial deficit: Secondary | ICD-10-CM

## 2020-05-03 DIAGNOSIS — R2689 Other abnormalities of gait and mobility: Secondary | ICD-10-CM

## 2020-05-03 DIAGNOSIS — R2681 Unsteadiness on feet: Secondary | ICD-10-CM

## 2020-05-03 DIAGNOSIS — R29818 Other symptoms and signs involving the nervous system: Secondary | ICD-10-CM

## 2020-05-03 DIAGNOSIS — R41840 Attention and concentration deficit: Secondary | ICD-10-CM

## 2020-05-03 NOTE — Patient Instructions (Addendum)
Memory Compensation Strategies  1. Use "WARM" strategy.  W= write it down  A= associate it  R= repeat it  M= make a mental note  2.   You can keep a Glass blower/designer.  Use a 3-ring notebook with sections for the following: calendar, important names and phone numbers,  medications, doctors' names/phone numbers, lists/reminders, and a section to journal what you did  each day.   3.    Use a calendar to write appointments down.  4.    Write yourself a schedule for the day.  This can be placed on the calendar or in a separate section of the Memory Notebook.  Keeping a  regular schedule can help memory.  5.    Use medication organizer with sections for each day or morning/evening pills.  You may need help loading it  6.    Keep a basket, or pegboard by the door.  Place items that you need to take out with you in the basket or on the pegboard.  You may also want to  include a message board for reminders.  7.    Use sticky notes.  Place sticky notes with reminders in a place where the task is performed.  For example: " turn off the  stove" placed by the stove, "lock the door" placed on the door at eye level, " take your medications" on  the bathroom mirror or by the place where you normally take your medications.  8.    Use alarms/timers.  Use while cooking to remind yourself to check on food or as a reminder to take your medicine, or as a  reminder to make a call, or as a reminder to perform another task, etc.     Visual compensation strategies:    1. Look for the edge of objects (to the left and/or right) so that you make sure you are seeing all of an object 2. Turn your head when walking, scan from side to side, particularly in busy environments 3. Use an organized scanning pattern. It's usually easier to scan from top to bottom, and left to right (like you are reading) 4. Double check yourself 5. Use a line guide (like a blank piece of paper) or your finger when reading

## 2020-05-03 NOTE — Therapy (Signed)
[facility] [address] [place], [place], [address] Phone: [phone]   Fax:  [phone]  Physical Therapy Treatment  Patient Details  Name: Michael Wells MRN: [id number] Date of Birth: 04/30/1940 Referring Provider (PT): Little Ishikawa, MD   Encounter Date: 05/03/2020   PT End of Session - 05/03/20 0725    Visit Number 12    Number of Visits 17    Date for PT Re-Evaluation 06/21/20   written for 60 day POC   Authorization Type [employer]    PT Start Time 0720    PT Stop Time 0800    PT Time Calculation (min) 40 min    Equipment Utilized During Treatment Gait belt    Activity Tolerance Patient tolerated treatment well    Behavior During Therapy [facility] for tasks assessed/performed           Past Medical History:  Diagnosis Date  . Anxiety   . Chronic pain   . H/O sleep disturbance     Past Surgical History:  Procedure Laterality Date  . IR ANGIO VERTEBRAL SEL SUBCLAVIAN INNOMINATE UNI L MOD SED  03/10/2020  . IR INTRAVSC STENT CERV CAROTID W/EMB-PROT MOD SED INCL ANGIO  03/10/2020  . IR STENT PLACEMENT ANTE CAROTID INC ANGIO  03/10/2020      . IR US GUIDE VASC ACCESS RIGHT  03/10/2020  . RADIOLOGY WITH ANESTHESIA N/A 03/10/2020   Procedure: IR WITH ANESTHESIA;  Surgeon: Radiologist, Medication, MD;  Location: [facility];  Service: Radiology;  Laterality: N/A;    There were no vitals filed for this visit.   Subjective Assessment - 05/03/20 0726    Subjective I am doing okay. 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    Currently in Pain? No/denies            SciFit: level 1 for 10'    [facility] PT Assessment - 05/03/20 0728      Dynamic Gait Index   Level Surface Mild Impairment    Change in Gait Speed Normal    Gait with Horizontal Head Turns Normal    Gait with Vertical Head Turns Normal    Gait and Pivot Turn Mild Impairment    Step Over Obstacle Mild Impairment    Step Around Obstacles Normal    Steps Mild Impairment    Total Score 20    DGI comment: 20/24                                   PT Short Term Goals - 04/20/20 1916      PT SHORT TERM GOAL #1   Title Pt will be independent with initial HEP in order to build upon functional gains made in therapy. ALL STGS DUE 04/19/20    Baseline 04/20/20: met per pt report in session today    Time --    Period --    Status Achieved    Target Date 04/19/20      PT SHORT TERM GOAL #2   Title Pt will improve DGI score to at  least a 15/24 in order to demo decr fall risk.    Baseline 04/20/20: met in session today with score 19/24    Time --    Period --    Status Achieved      PT SHORT TERM GOAL #3   Title Pt and pt's spouse will verbalize understanding of fall prevention in the home.    Baseline 04/20/20: issued/reviewed with pt today in session today, handout given to spouse in lobby    Time --    Period --    Status Achieved      PT SHORT TERM GOAL #4   Title Pt will improve gait speed to at least 2.2 ft/sec in order to demo improved community mobility.    Baseline 04/17/20: 18.82 seconds = 1.74 ft/sec    Status Not Met      PT SHORT TERM GOAL #5   Title Pt will perform 5x sit <> stand with BUE support in 28 seconds or less in order to demo improved functional BLE strength and decr fall risk.    Baseline 04/17/20: 31.85 seconds with UE support from chair    Status Not Met             PT Long Term Goals - 05/03/20 0734      PT LONG TERM GOAL #1   Title Pt will be independent with final HEP in order to build upon functional gains made in therapy. ALL LTGS DUE 05/17/20.    Time 8    Period  Weeks    Status On-going      PT LONG TERM GOAL #2   Title Pt will improve DGI score to at least a 18/24 in order to demo decr fall risk.    Baseline 13/24; 20/24 (05/03/20)    Time 8    Period Weeks    Status Achieved      PT LONG TERM GOAL #3   Title Pt will ambulate 500' outdoors over unlevel surfaces with supervision with no AD vs. LRAD in order to demo improved community mobility.    Baseline walked 500' in grass plus 10 ' on gravel and 10' on mulch with SBA (05/03/20)    Time 8    Period Weeks    Status Achieved      PT LONG TERM GOAL #4   Title Pt will improve gait speed to at least 2.5 ft/sec in order to demo improved community mobility.    Baseline 1.88 ft/sec with no AD; 0.74m/s (2.46 ft/s)    Time 8    Period Weeks    Status Achieved      PT LONG TERM GOAL #5   Title Pt will perform 5x sit <> stand with BUE support in 21 seconds or less in order to demo improved functional BLE strength and decr fall risk.    Baseline 33.69 seconds; 22 sec (05/03/20)    Time 8    Period Weeks    Status On-going                 Plan - 05/03/20 0727    Clinical Impression Statement Today's session was focused on addressing long term goals. LTG #2,3,4 met today.    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 Review final HEP (LTG#1) I discussed discharge from PT with patient and wife at next session. Plan discharge next session.    PT Home Exercise Plan Access Code: [email or web address] and Agree with Plan of Care Patient    Family Member Consulted pt's wife           Patient will benefit from skilled therapeutic intervention in order to improve the following deficits and impairments:  Abnormal gait,Decreased balance,Decreased activity tolerance,Decreased coordination,Difficulty walking,Decreased strength  Visit Diagnosis: Muscle weakness (generalized)  Unsteadiness on feet  Other abnormalities of gait and mobility     Problem List Patient Active Problem List   Diagnosis Date Noted  . Acute ischemic stroke ([place]) 03/10/2020  . Embolic stroke ([place]) 25/08/7197  . Chronic pain 03/09/2020    Kerrie Pleasure 05/03/2020, 8:03 AM  [facility] [address] [place], [place], [address] Phone: [phone]   Fax:  [phone]  Name: KEYDEN PAVLOV MRN: [id number] Date of Birth: 1940-04-19

## 2020-05-03 NOTE — Therapy (Signed)
[facility] [address] [place], [place], [address] Phone: [phone]   Fax:  [phone]  Occupational Therapy Treatment  Patient Details  Name: Michael Wells MRN: [id number] Date of Birth: [birth year] Referring Provider (OT): Dr. Avon Gully (will send to PCP as pt referred by hospitalist)   Encounter Date: 05/03/2020   OT End of Session - 05/03/20 0821    Visit Number 11    Number of Visits 25    Date for OT Re-Evaluation 06/14/20    Authorization Type [employer], out of network    Authorization - Visit Number 11    Authorization - Number of Visits 20    OT Start Time 0803    OT Stop Time 0845    OT Time Calculation (min) 42 min    Activity Tolerance Patient tolerated treatment well    Behavior During Therapy [facility] for tasks assessed/performed           Past Medical History:  Diagnosis Date  . Anxiety   . Chronic pain   . H/O sleep disturbance     Past Surgical History:  Procedure Laterality Date  . IR ANGIO VERTEBRAL SEL SUBCLAVIAN INNOMINATE UNI L MOD SED  03/10/2020  . IR INTRAVSC STENT CERV CAROTID W/EMB-PROT MOD SED INCL ANGIO  03/10/2020  . IR STENT PLACEMENT ANTE CAROTID INC ANGIO  03/10/2020      . IR US GUIDE VASC ACCESS RIGHT  03/10/2020  . RADIOLOGY WITH ANESTHESIA N/A 03/10/2020   Procedure: IR WITH ANESTHESIA;  Surgeon: Radiologist, Medication, MD;  Location: [facility];  Service: Radiology;  Laterality: N/A;    There were no vitals filed for this visit.   Subjective Assessment - 05/03/20 0813    Subjective  Denies pain    Pertinent History 80 y.o. 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    Patient Stated Goals improve balance and vision    Currently in Pain? No/denies                Treatment: Completing  12 piece puzzle mod difficulty, min v.c Arm bike x 5 mins level 1 for conditioning, min v.c for speed.                OT Treatment/ Education - 05/03/20 0845    Education Details Therapist reviewed vision and memory compensations with pt/ wife, handouts issued, upgraded theraband exercises to red, pt's wife is able to assist him 10-20 reps each exercise and min v.c, discussed plans for d/c next visit    Person(s) Educated Patient;Spouse    Methods Explanation;Verbal cues;Handout;Demonstration    Comprehension Verbalized understanding;Returned demonstration;Verbal cues required            OT Short Term Goals - 05/03/20 0816      OT SHORT TERM GOAL #1   Title I with inital HEP    Time 4    Period Weeks    Status Partially Met   04/25/20-Pt can complete with wife's assist   Target Date 04/19/20      OT SHORT TERM GOAL #2   Title Pt will verbalize understanding of compensatory strategeis for visual deficits    Time 4    Period Weeks    Status Partially Met   pt's wife verbalizes understanding, handout issued     OT SHORT TERM  GOAL #3   Title Pt/ wife will verbalize understanding of compensatory strategeis for cognitve/memory deficits.    Time 4    Period Weeks    Status Partially Met   Pt's wife verbalies understanding, handout issued     OT SHORT TERM GOAL #4   Title Pt will perform tabletop scanning activities with  80% or better accuracy.    Time 4    Period Weeks    Status Achieved   90-100% for basic today     OT SHORT TERM GOAL #5   Title Pt will navigate a min distracting environment and locate items with 75% or better accuracy.    Time 4    Period Weeks    Status On-going   70%     OT SHORT TERM GOAL #6   Title Pt will demonstrate ability to sequence a simple functional task with 90% or better accuracy in a reasonable amount of time    Baseline delayed processing    Time 4    Period Weeks    Status On-going   81 % on constant therapy             OT Long Term Goals - 05/03/20 3474      OT LONG TERM GOAL #1   Title I with updated HEP.    Status Partially Met   upgraded to red band, pt's wife can assist pt he is unable to perform Ily     OT LONG TERM GOAL #2   Title Pt will perfom environmental scanning activities in a mod distracting environment with 90% better accuracy.    Status On-going      OT LONG TERM GOAL #3   Title Pt will perfrom shower transfer and shower, at a modified independent level    Status Not Met   supervision     OT LONG TERM GOAL #4   Title Pt will retrieve 3 lbs fom an overhead shelf with RUE with pain no greater than 2/10    Status Achieved      OT LONG TERM GOAL #5   Title Pt will demonstrate improved LUE fine motor coordiantion as evidenced by decreasing LUE 9 hole peg test score to 60 secs or less.    Status Achieved   51.46     OT LONG TERM GOAL #6   Status Not Met   supervision with bathing                Plan - 05/03/20 0843    Clinical Impression Statement Pt is progressing towards goals. Pt has not fully achieved goals due to cognitve deficits.    OT Occupational Profile and History Detailed Assessment- Review of Records and additional review of physical, cognitive, psychosocial history related to current functional performance    Occupational performance deficits (Please refer to evaluation for details): ADL's;IADL's;Leisure;Social Participation    Body Structure / Function / Physical Skills ADL;UE functional use;Balance;Pain;Gait;ROM;GMC;Coordination;Decreased knowledge of precautions;IADL;Dexterity;Mobility    Cognitive Skills Attention;Learn;Memory;Perception;Problem Solve;Safety Awareness;Sequencing;Thought;Understand    Rehab Potential Good    Clinical Decision Making Limited treatment options, no task modification necessary    Comorbidities Affecting Occupational Performance: May have comorbidities impacting occupational performance    Modification or Assistance to  Complete Evaluation  No modification of tasks or assist necessary to complete eval    OT Frequency 2x / week   plus eval, anticipate d/c after 8 weeks   OT Duration 12 weeks    OT Treatment/Interventions Self-care/ADL training;Ultrasound;Energy  conservation;Visual/perceptual remediation/compensation;Patient/family education;DME and/or AE instruction;Paraffin;Gait Training;Passive range of motion;Balance training;Fluidtherapy;Cryotherapy;Therapist, nutritional;Therapeutic activities;Manual Therapy;Therapeutic exercise;Moist Heat;Neuromuscular education;Cognitive remediation/compensation    Plan environmental scanning check goals, functional sequencing, anticipate d/c next visit    Consulted and Agree with Plan of Care Patient;Family member/caregiver    Family Member Consulted wife           Patient will benefit from skilled therapeutic intervention in order to improve the following deficits and impairments:   Body Structure / Function / Physical Skills: ADL,UE functional use,Balance,Pain,Gait,ROM,GMC,Coordination,Decreased knowledge of precautions,IADL,Dexterity,Mobility Cognitive Skills: Attention,Learn,Memory,Perception,Problem Solve,Safety Awareness,Sequencing,Thought,Understand     Visit Diagnosis: Muscle weakness (generalized)  Visuospatial deficit  Attention and concentration deficit  Other symptoms and signs involving the nervous system  Frontal lobe and executive function deficit    Problem List Patient Active Problem List   Diagnosis Date Noted  . Acute ischemic stroke ([place]) 03/10/2020  . Embolic stroke ([place]) 38/93/7342  . Chronic pain 03/09/2020    Lylie Blacklock 05/03/2020, 9:23 AM  [facility] [address] [place], [place], [address] Phone: [phone]   Fax:  [phone]  Name: Michael Wells MRN: [id number] Date of Birth: 1940/09/25

## 2020-05-05 ENCOUNTER — Telehealth (HOSPITAL_COMMUNITY): Payer: Self-pay | Admitting: Student

## 2020-05-05 MED ORDER — TICAGRELOR 90 MG PO TABS: 90.0000 mg | ORAL_TABLET | Freq: Two times a day (BID) | ORAL | 1 refills | Status: AC

## 2020-05-05 NOTE — Progress Notes (Signed)
Patient called requesting refill for Brilinta. This was e-prescribed to the patient's pharmacy - [facility] on [place]. 90 mg tablets, dispense 60 with one refill.   Alwyn Ren, [place] [phone] 05/05/2020, 3:34 PM

## 2020-05-08 ENCOUNTER — Ambulatory Visit: Payer: Medicare (Managed Care) | Admitting: Occupational Therapy

## 2020-05-08 ENCOUNTER — Ambulatory Visit: Payer: Medicare (Managed Care) | Admitting: Physical Therapy

## 2020-05-10 ENCOUNTER — Other Ambulatory Visit: Payer: Self-pay

## 2020-05-10 ENCOUNTER — Ambulatory Visit: Payer: Medicare (Managed Care) | Admitting: Physical Therapy

## 2020-05-10 ENCOUNTER — Encounter: Payer: Self-pay | Admitting: Physical Therapy

## 2020-05-10 ENCOUNTER — Ambulatory Visit: Payer: Medicare (Managed Care) | Admitting: Occupational Therapy

## 2020-05-10 DIAGNOSIS — R41844 Frontal lobe and executive function deficit: Secondary | ICD-10-CM

## 2020-05-10 DIAGNOSIS — R41842 Visuospatial deficit: Secondary | ICD-10-CM

## 2020-05-10 DIAGNOSIS — M6281 Muscle weakness (generalized): Secondary | ICD-10-CM | POA: Diagnosis not present

## 2020-05-10 DIAGNOSIS — R29818 Other symptoms and signs involving the nervous system: Secondary | ICD-10-CM

## 2020-05-10 DIAGNOSIS — R41840 Attention and concentration deficit: Secondary | ICD-10-CM

## 2020-05-10 DIAGNOSIS — R2681 Unsteadiness on feet: Secondary | ICD-10-CM

## 2020-05-10 NOTE — Therapy (Signed)
[facility] [address] [place], [place], [address] Phone: [phone]   Fax:  [phone]  Physical Therapy Treatment/Discharge Summary  Patient Details  Name: Michael Wells MRN: [id number] Date of Birth: 06/18/1940 Referring Provider (PT): Little Ishikawa, MD   Encounter Date: 05/10/2020   PT End of Session - 05/10/20 0831    Visit Number 13    Number of Visits 17    Date for PT Re-Evaluation 19-Jul-2020   written for 60 day POC   Authorization Type [employer]    PT Start Time 0802   full time not used due to D/C visit   PT Stop Time 0827    PT Time Calculation (min) 25 min    Activity Tolerance Patient tolerated treatment well    Behavior During Therapy [facility] for tasks assessed/performed           Past Medical History:  Diagnosis Date  . Anxiety   . Chronic pain   . H/O sleep disturbance     Past Surgical History:  Procedure Laterality Date  . IR ANGIO VERTEBRAL SEL SUBCLAVIAN INNOMINATE UNI L MOD SED  03/10/2020  . IR INTRAVSC STENT CERV CAROTID W/EMB-PROT MOD SED INCL ANGIO  03/10/2020  . IR STENT PLACEMENT ANTE CAROTID INC ANGIO  03/10/2020      . IR US GUIDE VASC ACCESS RIGHT  03/10/2020  . RADIOLOGY WITH ANESTHESIA N/A 03/10/2020   Procedure: IR WITH ANESTHESIA;  Surgeon: Radiologist, Medication, MD;  Location: [facility];  Service: Radiology;  Laterality: N/A;    There were no vitals filed for this visit.   Subjective Assessment - 05/10/20 0803    Subjective Doing good, feels like he has made some progress.    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    Currently in Pain? No/denies                                  Access Code: [email or web address] URL: [email or web address] Date: 05/10/2020 Prepared by: Janann August  Reviewed HEP with pt able to demo proper technique with verbal/demo cues. Pt's spouse present as well for review.   Exercises Sit to Stand with Counter Support - 2 x daily - 5 x weekly - 1 sets - 10 reps Seated Knee Extension with Resistance - 2 x daily - 5 x weekly - 1 sets - 10 reps Seated Hip Abduction with Resistance - 2 x daily - 5 x weekly - 1 sets - 10 reps Heel Toe Raises with Counter Support - 2 x daily - 5 x weekly - 1 sets - 10 reps Standing Hip Abduction with Counter Support - 2 x daily - 5 x weekly - 1 sets - 10 reps  New addition on 05/10/20 Standing Marching - 2 x daily - 5 x weekly - 1 sets - 10 reps     PT Education - 05/10/20 0831    Education Details final HEP    Person(s) Educated Patient;Spouse    Methods Explanation;Demonstration;Handout;Verbal cues    Comprehension Verbalized understanding;Returned demonstration  PT Short Term Goals - 04/20/20 7096      PT SHORT TERM GOAL #1   Title Pt will be independent with initial HEP in order to build upon functional gains made in therapy. ALL STGS DUE 04/19/20    Baseline 04/20/20: met per pt report in session today    Time --    Period --    Status Achieved    Target Date 04/19/20      PT SHORT TERM GOAL #2   Title Pt will improve DGI score to at least a 15/24 in order to demo decr fall risk.    Baseline 04/20/20: met in session today with score 19/24    Time --    Period --    Status Achieved      PT SHORT TERM GOAL #3   Title Pt and pt's spouse will verbalize understanding of fall prevention in the home.    Baseline 04/20/20: issued/reviewed with pt today in session today, handout given to spouse in lobby    Time --    Period --    Status Achieved      PT SHORT TERM GOAL #4   Title Pt will improve gait speed to at least 2.2 ft/sec in order  to demo improved community mobility.    Baseline 04/17/20: 18.82 seconds = 1.74 ft/sec    Status Not Met      PT SHORT TERM GOAL #5   Title Pt will perform 5x sit <> stand with BUE support in 28 seconds or less in order to demo improved functional BLE strength and decr fall risk.    Baseline 04/17/20: 31.85 seconds with UE support from chair    Status Not Met             PT Long Term Goals - 05/10/20 4383      PT LONG TERM GOAL #1   Title Pt will be independent with final HEP in order to build upon functional gains made in therapy. ALL LTGS DUE 05/17/20.    Baseline reviewed on 05/10/20    Time 8    Period Weeks    Status Achieved      PT LONG TERM GOAL #2   Title Pt will improve DGI score to at least a 18/24 in order to demo decr fall risk.    Baseline 13/24; 20/24 (05/03/20)    Time 8    Period Weeks    Status Achieved      PT LONG TERM GOAL #3   Title Pt will ambulate 500' outdoors over unlevel surfaces with supervision with no AD vs. LRAD in order to demo improved community mobility.    Baseline walked 500' in grass plus 10 ' on gravel and 10' on mulch with SBA (05/03/20)    Time 8    Period Weeks    Status Achieved      PT LONG TERM GOAL #4   Title Pt will improve gait speed to at least 2.5 ft/sec in order to demo improved community mobility.    Baseline 1.88 ft/sec with no AD; 0.96ms (2.46 ft/s)    Time 8    Period Weeks    Status Achieved      PT LONG TERM GOAL #5   Title Pt will perform 5x sit <> stand with BUE support in 21 seconds or less in order to demo improved functional BLE strength and decr fall risk.    Baseline 33.69 seconds; 22 sec (05/03/20)  Time 8    Period Weeks    Status On-going            PHYSICAL THERAPY DISCHARGE SUMMARY  Visits from Start of Care: 13  Current functional level related to goals / functional outcomes: See LTGs   Remaining deficits: Impaired balance, decr strength, gait abnormalities, cognitive impairments.    Education / Equipment: HEP   Plan: Patient agrees to discharge.  Patient goals were met. Patient is being discharged due to meeting the stated rehab goals.  ?????           Plan - 05/10/20 4818    Clinical Impression Statement Today's skilled session focused on reviewing pt's HEP. Pt's spouse present throughout. Pt needing verbal cues for proper technique. Added marching for SLS and hip flexor strengthening. Pt has met the majority of his LTGs - will be discharged at this time with pt and pt's spouse in agreement. Pt continues with more shuffled steps and decr foot clearance during gait despite cueing and gait training (per spouse this has been a long standing issue before pt's CVA).    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;Locomotion Level    Examination-Participation Restrictions Community Activity;Driving   playing golf   Stability/Clinical Decision Making Stable/Uncomplicated    Rehab Potential Good    PT Frequency 2x / week    PT Duration 8 weeks    PT Treatment/Interventions ADLs/Self Care Home Management;DME Instruction;Gait training;Therapeutic activities;Functional mobility training;Stair training;Therapeutic exercise;Balance training;Neuromuscular re-education;Patient/family education;Vestibular;Energy conservation    PT Next Visit Plan D/C from PT    PT Home Exercise Plan Access Code: [facility]    Consulted and Agree with Plan of Care Patient    Family Member Consulted pt's wife           Patient will benefit from skilled therapeutic intervention in order to improve the following deficits and impairments:  Abnormal gait,Decreased balance,Decreased activity tolerance,Decreased coordination,Difficulty walking,Decreased strength  Visit Diagnosis: Muscle weakness (generalized)  Other symptoms and signs involving the  nervous system     Problem List Patient Active Problem List   Diagnosis Date Noted  . Acute ischemic stroke ([place]) 03/10/2020  . Embolic stroke ([place]) 56/31/4970  . Chronic pain 03/09/2020    Arliss Journey, PT, DPT  05/10/2020, 8:34 AM  [facility] [address] [place], [place], [address] Phone: [phone]   Fax:  [phone]  Name: MATEUSZ NEILAN MRN: [id number] Date of Birth: 11-15-1940

## 2020-05-10 NOTE — Therapy (Signed)
[facility] [address] [place], [place], [address] Phone: [phone]   Fax:  [phone]  Occupational Therapy Treatment/ Discharge  Patient Details  Name: Michael Wells MRN: [id number] Date of Birth: [birth year] Referring Provider (OT): Dr. Avon Gully (will send to PCP as pt referred by hospitalist)  OCCUPATIONAL THERAPY DISCHARGE SUMMARY    Current functional level related to goals / functional outcomes: Pt made overall progress yet he did not fully meet goals due to cognitive deficits. Pt's wife reports he had short term memory deficits prior to CVA   Remaining deficits: cognitive deficits, visual deficits, decreased coordination decreased balance   Education / Equipment: Pt/ wife were educated in: UE and visual HEP, vision compensation strategies, recommendation that pt does not drive and memory compensation strategies. Pt / wife verbalized understanding of of all education. Plan: Patient agrees to discharge.  Patient goals were partially met. Patient is being discharged due to being pleased with the current functional level.  ?????         Encounter Date: 05/10/2020   OT End of Session - 05/10/20 0729    Visit Number 12    Number of Visits 25    Date for OT Re-Evaluation 06/14/20    Authorization Type [employer], out of network    Authorization Time Period 37 days-06/14/20    Authorization - Visit Number 12    Authorization - Number of Visits 20    OT Start Time [phone]    OT Stop Time 0758    OT Time Calculation (min) 40 min           Past Medical History:  Diagnosis Date  . Anxiety   . Chronic pain   . H/O sleep disturbance     Past Surgical History:  Procedure Laterality Date  . IR ANGIO VERTEBRAL SEL SUBCLAVIAN INNOMINATE UNI L MOD SED  03/10/2020  . IR INTRAVSC STENT CERV CAROTID W/EMB-PROT MOD SED INCL ANGIO  03/10/2020  . IR STENT PLACEMENT ANTE CAROTID INC ANGIO  03/10/2020      .  IR US GUIDE VASC ACCESS RIGHT  03/10/2020  . RADIOLOGY WITH ANESTHESIA N/A 03/10/2020   Procedure: IR WITH ANESTHESIA;  Surgeon: Radiologist, Medication, MD;  Location: [facility];  Service: Radiology;  Laterality: N/A;    There were no vitals filed for this visit.   Subjective Assessment - 05/10/20 0729    Subjective  Denies pain    Pertinent History 80 y.o. 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    Patient Stated Goals improve balance and vision    Currently in Pain? No/denies                  Treatment: Arm bike x 6 mins level 1 for conditioning, min v.c to maintain speed Environmental scanning 73% on first pass Sequencing functional tasks on constant therapy 81%, 83.02 secs for each Therapist checked progress towards goals and discussed with pt/ wife                 OT Short Term Goals - 05/10/20 0730      OT SHORT TERM GOAL #1   Title I with inital HEP    Time 4    Period Weeks    Status Partially Met   04/25/20-Pt can complete with wife's assist  Target Date 04/19/20      OT SHORT TERM GOAL #2   Title Pt will verbalize understanding of compensatory strategeis for visual deficits    Time 4    Period Weeks    Status Partially Met   pt's wife verbalizes understanding, handout issued     OT SHORT TERM GOAL #3   Title Pt/ wife will verbalize understanding of compensatory strategeis for cognitve/memory deficits.    Time 4    Period Weeks    Status Partially Met   Pt's wife verbalies understanding, handout issued     OT SHORT TERM GOAL #4   Title Pt will perform tabletop scanning activities with  80% or better accuracy.    Time 4    Period Weeks    Status Achieved   90-100% for basic today     OT SHORT TERM GOAL #5   Title Pt will navigate a min distracting environment and locate  items with 75% or better accuracy.    Time 4    Period Weeks    Status Not Met   73%     OT SHORT TERM GOAL #6   Title Pt will demonstrate ability to sequence a simple functional task with 90% or better accuracy in a reasonable amount of time    Baseline delayed processing    Time 4    Period Weeks    Status Not Met   81 % on constant therapy            OT Long Term Goals - 05/10/20 0733      OT LONG TERM GOAL #1   Title I with updated HEP.    Status Partially Met   upgraded to red band, pt's wife can assist pt he is unable to perform Ily     OT LONG TERM GOAL #2   Title Pt will perfom environmental scanning activities in a mod distracting environment with 90% better accuracy.    Status Not Met   73% 11/14 located     OT LONG TERM GOAL #3   Title Pt will perfrom shower transfer and shower, at a modified independent level    Status Not Met   supervision     OT LONG TERM GOAL #4   Title Pt will retrieve 3 lbs fom an overhead shelf with RUE with pain no greater than 2/10    Status Achieved      OT LONG TERM GOAL #5   Title Pt will demonstrate improved LUE fine motor coordiantion as evidenced by decreasing LUE 9 hole peg test score to 60 secs or less.    Status Achieved   51.46     OT LONG TERM GOAL #6   Title I with all basic ADLs.    Status Not Met   supervision with bathing                Plan - 05/10/20 0750    Clinical Impression Statement Pt demonstrates progress towards goals however he did not fully meet all goals due to visual and cognitve deficits.    OT Occupational Profile and History Detailed Assessment- Review of Records and additional review of physical, cognitive, psychosocial history related to current functional performance    Occupational performance deficits (Please refer to evaluation for details): ADL's;IADL's;Leisure;Social Participation    Body Structure / Function / Physical Skills ADL;UE functional  use;Balance;Pain;Gait;ROM;GMC;Coordination;Decreased knowledge of precautions;IADL;Dexterity;Mobility    Cognitive Skills Attention;Learn;Memory;Perception;Problem Solve;Safety Awareness;Sequencing;Thought;Understand    Rehab Potential  Good    Clinical Decision Making Limited treatment options, no task modification necessary    Comorbidities Affecting Occupational Performance: May have comorbidities impacting occupational performance    Modification or Assistance to Complete Evaluation  No modification of tasks or assist necessary to complete eval    OT Frequency 2x / week   plus eval, anticipate d/c after 8 weeks   OT Duration 12 weeks    OT Treatment/Interventions Self-care/ADL training;Ultrasound;Energy conservation;Visual/perceptual remediation/compensation;Patient/family education;DME and/or AE instruction;Paraffin;Gait Training;Passive range of motion;Balance training;Fluidtherapy;Cryotherapy;Therapist, nutritional;Therapeutic activities;Manual Therapy;Therapeutic exercise;Moist Heat;Neuromuscular education;Cognitive remediation/compensation    Plan d/c OT    Consulted and Agree with Plan of Care Patient;Family member/caregiver    Family Member Consulted wife           Patient will benefit from skilled therapeutic intervention in order to improve the following deficits and impairments:   Body Structure / Function / Physical Skills: ADL,UE functional use,Balance,Pain,Gait,ROM,GMC,Coordination,Decreased knowledge of precautions,IADL,Dexterity,Mobility Cognitive Skills: Attention,Learn,Memory,Perception,Problem Solve,Safety Awareness,Sequencing,Thought,Understand     Visit Diagnosis: Muscle weakness (generalized)  Visuospatial deficit  Attention and concentration deficit  Other symptoms and signs involving the nervous system  Frontal lobe and executive function deficit  Unsteadiness on feet    Problem List Patient Active Problem List   Diagnosis Date Noted  . Acute  ischemic stroke ([facility]) 03/10/2020  . 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 Chronic pain 03/09/2020    Nekeisha Aure 05/10/2020, 7:55 AM Theone Murdoch, OTR/L [phone] Phone: [phone] 10:03 AM 05/10/20 [facility] [address] [place], [place], [address] Phone: [phone]   Fax:  [phone]  Name: Michael Wells MRN: [id number] Date of Birth: 1941/01/10

## 2020-05-10 NOTE — Patient Instructions (Signed)
Access Code: [facility] URL: [email or web address] Date: 05/10/2020 Prepared by: Sherlie Ban  Exercises Sit to Stand with Counter Support - 2 x daily - 5 x weekly - 1 sets - 10 reps Seated Knee Extension with Resistance - 2 x daily - 5 x weekly - 1 sets - 10 reps Seated Hip Abduction with Resistance - 2 x daily - 5 x weekly - 1 sets - 10 reps Heel Toe Raises with Counter Support - 2 x daily - 5 x weekly - 1 sets - 10 reps Standing Hip Abduction with Counter Support - 2 x daily - 5 x weekly - 1 sets - 10 reps Standing Marching - 2 x daily - 5 x weekly - 1 sets - 10 reps

## 2020-05-15 ENCOUNTER — Ambulatory Visit: Payer: Medicare (Managed Care) | Admitting: Physical Therapy

## 2020-05-15 ENCOUNTER — Ambulatory Visit: Payer: Medicare (Managed Care) | Admitting: Occupational Therapy

## 2020-05-17 ENCOUNTER — Encounter: Payer: Medicare (Managed Care) | Admitting: Occupational Therapy

## 2020-05-17 ENCOUNTER — Ambulatory Visit: Payer: Medicare (Managed Care) | Admitting: Physical Therapy

## 2020-05-20 ENCOUNTER — Encounter (HOSPITAL_COMMUNITY): Payer: Self-pay

## 2020-05-20 ENCOUNTER — Other Ambulatory Visit: Payer: Self-pay

## 2020-05-20 ENCOUNTER — Inpatient Hospital Stay (HOSPITAL_COMMUNITY)
Admission: EM | Admit: 2020-05-20 | Discharge: 2020-07-14 | DRG: 871 | Disposition: E | Payer: Medicare (Managed Care) | Attending: Internal Medicine | Admitting: Internal Medicine

## 2020-05-20 ENCOUNTER — Observation Stay (HOSPITAL_COMMUNITY): Payer: Medicare (Managed Care)

## 2020-05-20 ENCOUNTER — Emergency Department (HOSPITAL_COMMUNITY): Payer: Medicare (Managed Care)

## 2020-05-20 DIAGNOSIS — I712 Thoracic aortic aneurysm, without rupture: Secondary | ICD-10-CM | POA: Diagnosis present

## 2020-05-20 DIAGNOSIS — R0902 Hypoxemia: Secondary | ICD-10-CM

## 2020-05-20 DIAGNOSIS — D6959 Other secondary thrombocytopenia: Secondary | ICD-10-CM | POA: Diagnosis not present

## 2020-05-20 DIAGNOSIS — E861 Hypovolemia: Secondary | ICD-10-CM | POA: Diagnosis present

## 2020-05-20 DIAGNOSIS — Z7902 Long term (current) use of antithrombotics/antiplatelets: Secondary | ICD-10-CM

## 2020-05-20 DIAGNOSIS — E785 Hyperlipidemia, unspecified: Secondary | ICD-10-CM | POA: Diagnosis present

## 2020-05-20 DIAGNOSIS — Z810 Family history of intellectual disabilities: Secondary | ICD-10-CM

## 2020-05-20 DIAGNOSIS — R64 Cachexia: Secondary | ICD-10-CM | POA: Diagnosis present

## 2020-05-20 DIAGNOSIS — J9811 Atelectasis: Secondary | ICD-10-CM | POA: Diagnosis not present

## 2020-05-20 DIAGNOSIS — Z8673 Personal history of transient ischemic attack (TIA), and cerebral infarction without residual deficits: Secondary | ICD-10-CM | POA: Diagnosis not present

## 2020-05-20 DIAGNOSIS — E538 Deficiency of other specified B group vitamins: Secondary | ICD-10-CM | POA: Diagnosis present

## 2020-05-20 DIAGNOSIS — G9341 Metabolic encephalopathy: Secondary | ICD-10-CM | POA: Diagnosis present

## 2020-05-20 DIAGNOSIS — K8020 Calculus of gallbladder without cholecystitis without obstruction: Secondary | ICD-10-CM | POA: Diagnosis present

## 2020-05-20 DIAGNOSIS — A414 Sepsis due to anaerobes: Principal | ICD-10-CM | POA: Diagnosis present

## 2020-05-20 DIAGNOSIS — R54 Age-related physical debility: Secondary | ICD-10-CM | POA: Diagnosis present

## 2020-05-20 DIAGNOSIS — Z6826 Body mass index (BMI) 26.0-26.9, adult: Secondary | ICD-10-CM

## 2020-05-20 DIAGNOSIS — Z79899 Other long term (current) drug therapy: Secondary | ICD-10-CM

## 2020-05-20 DIAGNOSIS — R739 Hyperglycemia, unspecified: Secondary | ICD-10-CM | POA: Diagnosis present

## 2020-05-20 DIAGNOSIS — K529 Noninfective gastroenteritis and colitis, unspecified: Secondary | ICD-10-CM | POA: Diagnosis present

## 2020-05-20 DIAGNOSIS — Z7982 Long term (current) use of aspirin: Secondary | ICD-10-CM

## 2020-05-20 DIAGNOSIS — R627 Adult failure to thrive: Secondary | ICD-10-CM

## 2020-05-20 DIAGNOSIS — R197 Diarrhea, unspecified: Secondary | ICD-10-CM

## 2020-05-20 DIAGNOSIS — Z8042 Family history of malignant neoplasm of prostate: Secondary | ICD-10-CM

## 2020-05-20 DIAGNOSIS — Z8261 Family history of arthritis: Secondary | ICD-10-CM

## 2020-05-20 DIAGNOSIS — E871 Hypo-osmolality and hyponatremia: Secondary | ICD-10-CM | POA: Diagnosis present

## 2020-05-20 DIAGNOSIS — G8929 Other chronic pain: Secondary | ICD-10-CM | POA: Diagnosis present

## 2020-05-20 DIAGNOSIS — E162 Hypoglycemia, unspecified: Secondary | ICD-10-CM | POA: Diagnosis not present

## 2020-05-20 DIAGNOSIS — F0150 Vascular dementia without behavioral disturbance: Secondary | ICD-10-CM | POA: Diagnosis present

## 2020-05-20 DIAGNOSIS — E860 Dehydration: Secondary | ICD-10-CM | POA: Diagnosis present

## 2020-05-20 DIAGNOSIS — R7989 Other specified abnormal findings of blood chemistry: Secondary | ICD-10-CM

## 2020-05-20 DIAGNOSIS — Z808 Family history of malignant neoplasm of other organs or systems: Secondary | ICD-10-CM

## 2020-05-20 DIAGNOSIS — B9620 Unspecified Escherichia coli [E. coli] as the cause of diseases classified elsewhere: Secondary | ICD-10-CM | POA: Diagnosis present

## 2020-05-20 DIAGNOSIS — N390 Urinary tract infection, site not specified: Secondary | ICD-10-CM | POA: Diagnosis present

## 2020-05-20 DIAGNOSIS — Z515 Encounter for palliative care: Secondary | ICD-10-CM

## 2020-05-20 DIAGNOSIS — Z7189 Other specified counseling: Secondary | ICD-10-CM

## 2020-05-20 DIAGNOSIS — E876 Hypokalemia: Secondary | ICD-10-CM | POA: Diagnosis not present

## 2020-05-20 DIAGNOSIS — R6520 Severe sepsis without septic shock: Secondary | ICD-10-CM

## 2020-05-20 DIAGNOSIS — A0472 Enterocolitis due to Clostridium difficile, not specified as recurrent: Secondary | ICD-10-CM | POA: Diagnosis present

## 2020-05-20 DIAGNOSIS — Z9189 Other specified personal risk factors, not elsewhere classified: Secondary | ICD-10-CM

## 2020-05-20 DIAGNOSIS — A419 Sepsis, unspecified organism: Secondary | ICD-10-CM | POA: Diagnosis present

## 2020-05-20 DIAGNOSIS — I6521 Occlusion and stenosis of right carotid artery: Secondary | ICD-10-CM | POA: Diagnosis present

## 2020-05-20 DIAGNOSIS — M545 Low back pain, unspecified: Secondary | ICD-10-CM | POA: Diagnosis present

## 2020-05-20 DIAGNOSIS — Z813 Family history of other psychoactive substance abuse and dependence: Secondary | ICD-10-CM

## 2020-05-20 DIAGNOSIS — R0602 Shortness of breath: Secondary | ICD-10-CM

## 2020-05-20 DIAGNOSIS — J9601 Acute respiratory failure with hypoxia: Secondary | ICD-10-CM | POA: Diagnosis not present

## 2020-05-20 DIAGNOSIS — Z20822 Contact with and (suspected) exposure to covid-19: Secondary | ICD-10-CM | POA: Diagnosis present

## 2020-05-20 DIAGNOSIS — L98411 Non-pressure chronic ulcer of buttock limited to breakdown of skin: Secondary | ICD-10-CM | POA: Diagnosis present

## 2020-05-20 DIAGNOSIS — Z66 Do not resuscitate: Secondary | ICD-10-CM | POA: Diagnosis not present

## 2020-05-20 HISTORY — DX: Personal history of transient ischemic attack (TIA), and cerebral infarction without residual deficits: Z86.73

## 2020-05-20 HISTORY — DX: Occlusion and stenosis of right carotid artery: I65.21

## 2020-05-20 LAB — I-STAT ARTERIAL BLOOD GAS, ED
Acid-base deficit: 3 mmol/L — ABNORMAL HIGH (ref 0.0–2.0)
Bicarbonate: 19.5 mmol/L — ABNORMAL LOW (ref 20.0–28.0)
Calcium, Ion: 1.34 mmol/L (ref 1.15–1.40)
HCT: 40 % (ref 39.0–52.0)
Hemoglobin: 13.6 g/dL (ref 13.0–17.0)
O2 Saturation: 97 %
Patient temperature: 98.6
Potassium: 2.9 mmol/L — ABNORMAL LOW (ref 3.5–5.1)
Sodium: 133 mmol/L — ABNORMAL LOW (ref 135–145)
TCO2: 20 mmol/L — ABNORMAL LOW (ref 22–32)
pCO2 arterial: 28.5 mmHg — ABNORMAL LOW (ref 32.0–48.0)
pH, Arterial: 7.444 (ref 7.350–7.450)
pO2, Arterial: 86 mmHg (ref 83.0–108.0)

## 2020-05-20 LAB — RESP PANEL BY RT-PCR (FLU A&B, COVID) ARPGX2
Influenza A by PCR: NEGATIVE
Influenza B by PCR: NEGATIVE
SARS Coronavirus 2 by RT PCR: NEGATIVE

## 2020-05-20 LAB — COMPREHENSIVE METABOLIC PANEL
ALT: 20 U/L (ref 0–44)
AST: 30 U/L (ref 15–41)
Albumin: 2.5 g/dL — ABNORMAL LOW (ref 3.5–5.0)
Alkaline Phosphatase: 129 U/L — ABNORMAL HIGH (ref 38–126)
Anion gap: 11 (ref 5–15)
BUN: 17 mg/dL (ref 8–23)
CO2: 19 mmol/L — ABNORMAL LOW (ref 22–32)
Calcium: 9.9 mg/dL (ref 8.9–10.3)
Chloride: 101 mmol/L (ref 98–111)
Creatinine, Ser: 1.16 mg/dL (ref 0.61–1.24)
GFR, Estimated: >60 mL/min (ref >60)
Glucose, Bld: 148 mg/dL — ABNORMAL HIGH (ref 70–99)
Potassium: 4.2 mmol/L (ref 3.5–5.1)
Sodium: 131 mmol/L — ABNORMAL LOW (ref 135–145)
Total Bilirubin: 3.7 mg/dL — ABNORMAL HIGH (ref 0.3–1.2)
Total Protein: 6.1 g/dL — ABNORMAL LOW (ref 6.5–8.1)

## 2020-05-20 LAB — CBC WITH DIFFERENTIAL/PLATELET
Abs Immature Granulocytes: 0 10*3/uL (ref 0.00–0.07)
Basophils Absolute: 0 10*3/uL (ref 0.0–0.1)
Basophils Relative: 0 %
Eosinophils Absolute: 0 10*3/uL (ref 0.0–0.5)
Eosinophils Relative: 0 %
HCT: 49.1 % (ref 39.0–52.0)
Hemoglobin: 16.8 g/dL (ref 13.0–17.0)
Lymphocytes Relative: 1 %
Lymphs Abs: 0.3 10*3/uL — ABNORMAL LOW (ref 0.7–4.0)
MCH: 33.1 pg (ref 26.0–34.0)
MCHC: 34.2 g/dL (ref 30.0–36.0)
MCV: 96.8 fL (ref 80.0–100.0)
Monocytes Absolute: 1.4 10*3/uL — ABNORMAL HIGH (ref 0.1–1.0)
Monocytes Relative: 4 %
Neutro Abs: 32.3 10*3/uL — ABNORMAL HIGH (ref 1.7–7.7)
Neutrophils Relative %: 95 %
Platelets: 295 10*3/uL (ref 150–400)
RBC: 5.07 MIL/uL (ref 4.22–5.81)
RDW: 14.5 % (ref 11.5–15.5)
WBC: 34 10*3/uL — ABNORMAL HIGH (ref 4.0–10.5)
nRBC: 0 % (ref 0.0–0.2)
nRBC: 0 /100 WBC

## 2020-05-20 LAB — CLOSTRIDIUM DIFFICILE BY PCR, REFLEXED: Toxigenic C. Difficile by PCR: POSITIVE — AB

## 2020-05-20 LAB — C DIFFICILE QUICK SCREEN W PCR REFLEX
C Diff antigen: POSITIVE — AB
C Diff toxin: NEGATIVE

## 2020-05-20 LAB — VITAMIN B12: Vitamin B-12: 86 pg/mL — ABNORMAL LOW (ref 180–914)

## 2020-05-20 LAB — D-DIMER, QUANTITATIVE: D-Dimer, Quant: 4.01 ug/mL-FEU — ABNORMAL HIGH (ref 0.00–0.50)

## 2020-05-20 LAB — C-REACTIVE PROTEIN: CRP: 10.6 mg/dL — ABNORMAL HIGH (ref <1.0)

## 2020-05-20 LAB — LACTIC ACID, PLASMA: Lactic Acid, Venous: 3 mmol/L (ref 0.5–1.9)

## 2020-05-20 MED ORDER — ACETAMINOPHEN 650 MG RE SUPP
650.0000 mg | Freq: Four times a day (QID) | RECTAL | Status: DC | PRN
  Administered 2020-06-01: 650 mg via RECTAL
  Filled 2020-05-20: qty 1

## 2020-05-20 MED ORDER — ONDANSETRON HCL 4 MG/2ML IJ SOLN: 4.0000 mg | Freq: Four times a day (QID) | INTRAMUSCULAR | Status: DC | PRN

## 2020-05-20 MED ORDER — SODIUM CHLORIDE 0.9 % IV SOLN: 2.0000 g | Freq: Once | INTRAVENOUS | Status: DC

## 2020-05-20 MED ORDER — ATORVASTATIN CALCIUM 40 MG PO TABS
40.0000 mg | ORAL_TABLET | Freq: Every day | ORAL | Status: DC
  Administered 2020-05-21 – 2020-05-31 (×11): 40 mg via ORAL
  Filled 2020-05-20 (×11): qty 1

## 2020-05-20 MED ORDER — ENOXAPARIN SODIUM 40 MG/0.4ML IJ SOSY
40.0000 mg | PREFILLED_SYRINGE | INTRAMUSCULAR | Status: DC
  Administered 2020-05-21 – 2020-05-31 (×11): 40 mg via SUBCUTANEOUS
  Filled 2020-05-20 (×11): qty 0.4

## 2020-05-20 MED ORDER — SODIUM CHLORIDE 0.9 % IV SOLN: 2.0000 g | Freq: Two times a day (BID) | INTRAVENOUS | Status: DC

## 2020-05-20 MED ORDER — ACETAMINOPHEN 325 MG PO TABS
650.0000 mg | ORAL_TABLET | Freq: Four times a day (QID) | ORAL | Status: DC | PRN
  Administered 2020-05-22 – 2020-05-26 (×4): 650 mg via ORAL
  Filled 2020-05-20 (×4): qty 2

## 2020-05-20 MED ORDER — IOHEXOL 350 MG/ML SOLN
75.0000 mL | Freq: Once | INTRAVENOUS | Status: AC | PRN
  Administered 2020-05-20: 75 mL via INTRAVENOUS

## 2020-05-20 MED ORDER — LACTATED RINGERS IV SOLN: INTRAVENOUS | Status: AC

## 2020-05-20 MED ORDER — SODIUM CHLORIDE 0.9 % IV BOLUS
500.0000 mL | Freq: Once | INTRAVENOUS | Status: AC
Start: 2020-05-20 — End: 2020-05-20
  Administered 2020-05-20: 500 mL via INTRAVENOUS

## 2020-05-20 MED ORDER — IOHEXOL 300 MG/ML  SOLN
75.0000 mL | Freq: Once | INTRAMUSCULAR | Status: AC | PRN
  Administered 2020-05-20: 75 mL via INTRAVENOUS

## 2020-05-20 MED ORDER — SODIUM CHLORIDE 0.9 % IV BOLUS
1500.0000 mL | Freq: Once | INTRAVENOUS | Status: AC
  Administered 2020-05-20: 1500 mL via INTRAVENOUS

## 2020-05-20 MED ORDER — METRONIDAZOLE 500 MG/100ML IV SOLN: 500.0000 mg | Freq: Three times a day (TID) | INTRAVENOUS | Status: DC

## 2020-05-20 MED ORDER — ONDANSETRON HCL 4 MG PO TABS: 4.0000 mg | ORAL_TABLET | Freq: Four times a day (QID) | ORAL | Status: DC | PRN

## 2020-05-20 MED ORDER — SODIUM CHLORIDE 0.9 % IV SOLN: INTRAVENOUS | Status: DC

## 2020-05-20 MED ORDER — ASPIRIN EC 81 MG PO TBEC
81.0000 mg | DELAYED_RELEASE_TABLET | Freq: Every day | ORAL | Status: DC
  Administered 2020-05-21 – 2020-05-28 (×8): 81 mg via ORAL
  Filled 2020-05-20 (×8): qty 1

## 2020-05-20 MED ORDER — MELATONIN 5 MG PO TABS
10.0000 mg | ORAL_TABLET | Freq: Every evening | ORAL | Status: DC | PRN
  Administered 2020-05-22 – 2020-05-24 (×3): 10 mg via ORAL
  Filled 2020-05-20 (×4): qty 2

## 2020-05-20 MED ORDER — LACTATED RINGERS IV BOLUS
500.0000 mL | Freq: Once | INTRAVENOUS | Status: AC
  Administered 2020-05-21: 500 mL via INTRAVENOUS

## 2020-05-20 MED ORDER — VANCOMYCIN 50 MG/ML ORAL SOLUTION
125.0000 mg | Freq: Four times a day (QID) | ORAL | Status: DC
  Administered 2020-05-21 – 2020-06-03 (×45): 125 mg via ORAL
  Filled 2020-05-20 (×57): qty 2.5

## 2020-05-20 MED ORDER — TICAGRELOR 90 MG PO TABS
90.0000 mg | ORAL_TABLET | Freq: Two times a day (BID) | ORAL | Status: DC
  Administered 2020-05-21 – 2020-05-31 (×23): 90 mg via ORAL
  Filled 2020-05-20 (×25): qty 1

## 2020-05-20 NOTE — ED Provider Notes (Signed)
[facility] EMERGENCY DEPARTMENT Provider Note   CSN: [id number] Arrival date & time: 05/25/2020  1340     History Chief Complaint  Patient presents with  . Diarrhea    Michael Wells is a 80 y.o. male.  HPI He presents for evaluation of diarrhea for 2 weeks, with suspected dehydration.  He went to see his PCP yesterday, and they treated him with IV fluids.  He apparently was referred here for further care and treatment.  There has been no fever, chills, cough, shortness of breath, vomiting or reported dizziness.  Patient has been generally weak.  He feels like he cannot eat the usual amount of food.  Patient defers most answers to questions, to his wife who is at the bedside.  Past Medical History:  Diagnosis Date  . Anxiety   . Chronic pain   . H/O sleep disturbance     Patient Active Problem List   Diagnosis Date Noted  . Acute colitis 06/11/2020  . Acute ischemic stroke ([facility]) 03/10/2020  . Embolic stroke ([facility]) 00/34/9179  . Chronic pain 03/09/2020    Past Surgical History:  Procedure Laterality Date  . IR ANGIO VERTEBRAL SEL SUBCLAVIAN INNOMINATE UNI L MOD SED  03/10/2020  . IR INTRAVSC STENT CERV CAROTID W/EMB-PROT MOD SED INCL ANGIO  03/10/2020  . IR STENT PLACEMENT ANTE CAROTID INC ANGIO  03/10/2020      . IR US GUIDE VASC ACCESS RIGHT  03/10/2020  . RADIOLOGY WITH ANESTHESIA N/A 03/10/2020   Procedure: IR WITH ANESTHESIA;  Surgeon: Radiologist, Medication, MD;  Location: [facility];  Service: Radiology;  Laterality: N/A;       Family History  Problem Relation Age of Onset  . Cancer Mother        cervical  . Cancer Father        prostate  . Arthritis Sister   . Drug abuse Brother   . Mental retardation Brother   . Aneurysm Brother     Social History   Tobacco Use  . Smoking status: Never Smoker  Substance Use Topics  . Alcohol use: Yes    Alcohol/week: 0.0 standard drinks    Comment: socially 1-2 drinks month  . Drug use: No    Home  Medications Prior to Admission medications   Medication Sig Start Date End Date Taking? Authorizing Provider  aspirin 81 MG EC tablet Take 1 tablet (81 mg total) by mouth daily. Swallow whole. 03/11/20   Little Ishikawa, MD  atorvastatin (LIPITOR) 40 MG tablet Take 1 tablet (40 mg total) by mouth daily. 03/11/20   Little Ishikawa, MD  HYDROcodone-acetaminophen (NORCO/VICODIN) 5-325 MG tablet Take 1 tablet by mouth every 6 (six) hours as needed for moderate pain.    [provider]  ticagrelor (BRILINTA) 90 MG TABS tablet Take 1 tablet (90 mg total) by mouth 2 (two) times daily. 05/05/20   Theresa Duty, NP    Allergies    Patient has no known allergies.  Review of Systems   Review of Systems  All other systems reviewed and are negative.   Physical Exam Updated Vital Signs BP 118/83   Pulse (!) 110   Temp 99 F (37.2 C) (Oral)   Resp 15   Ht $R'5\' 7"'oi$  (1.702 m)   Wt 77.1 kg   SpO2 91%   BMI 26.63 kg/m   Physical Exam Vitals and nursing note reviewed.  Constitutional:      General: He is not in  acute distress.    Appearance: He is well-developed. He is not ill-appearing, toxic-appearing or diaphoretic.     Comments: Elderly, frail  HENT:     Head: Normocephalic and atraumatic.     Right Ear: External ear normal.     Left Ear: External ear normal.  Eyes:     Conjunctiva/sclera: Conjunctivae normal.     Pupils: Pupils are equal, round, and reactive to light.  Neck:     Trachea: Phonation normal.  Cardiovascular:     Rate and Rhythm: Normal rate.  Pulmonary:     Effort: Pulmonary effort is normal.  Abdominal:     General: There is no distension.     Palpations: Abdomen is soft.     Tenderness: There is no abdominal tenderness.  Musculoskeletal:        General: Normal range of motion.     Cervical back: Normal range of motion and neck supple.  Skin:    General: Skin is warm and dry.  Neurological:     Mental Status: He is alert and oriented to  person, place, and time.     Cranial Nerves: No cranial nerve deficit.     Sensory: No sensory deficit.     Motor: No abnormal muscle tone.     Coordination: Coordination normal.  Psychiatric:        Mood and Affect: Mood normal.        Behavior: Behavior normal.        Thought Content: Thought content normal.        Judgment: Judgment normal.     ED Results / Procedures / Treatments   Labs (all labs ordered are listed, but only abnormal results are displayed) Labs Reviewed  COMPREHENSIVE METABOLIC PANEL - Abnormal; Notable for the following components:      Result Value   Sodium 131 (*)    CO2 19 (*)    Glucose, Bld 148 (*)    Total Protein 6.1 (*)    Albumin 2.5 (*)    Alkaline Phosphatase 129 (*)    Total Bilirubin 3.7 (*)    All other components within normal limits  CBC WITH DIFFERENTIAL/PLATELET - Abnormal; Notable for the following components:   WBC 34.0 (*)    Neutro Abs 32.3 (*)    Lymphs Abs 0.3 (*)    Monocytes Absolute 1.4 (*)    All other components within normal limits  RESP PANEL BY RT-PCR (FLU A&B, COVID) ARPGX2  C DIFFICILE QUICK SCREEN W PCR REFLEX  GASTROINTESTINAL PANEL BY PCR, STOOL (REPLACES STOOL CULTURE)  URINALYSIS, ROUTINE W REFLEX MICROSCOPIC    EKG EKG Interpretation  Date/Time:  Saturday May 20 2020 14:09:01 EDT Ventricular Rate:  114 PR Interval:  160 QRS Duration: 92 QT Interval:  330 QTC Calculation: 454 R Axis:   -43 Text Interpretation: Sinus tachycardia with occasional Premature ventricular complexes Left axis deviation Left ventricular hypertrophy with repolarization abnormality ( R in aVL ) Cannot rule out Septal infarct , age undetermined Abnormal ECG Since last tracing rate faster and pvc new Otherwise no significant change Confirmed by Daleen Bo [phone]) on 05/26/2020 7:32:26 PM   Radiology CT Abdomen Pelvis W Contrast  Result Date: 06/05/2020 CLINICAL DATA:  I diarrhea for 2 more weeks EXAM: CT ABDOMEN AND PELVIS WITH  CONTRAST TECHNIQUE: Multidetector CT imaging of the abdomen and pelvis was performed using the standard protocol following bolus administration of intravenous contrast. CONTRAST:  59mL OMNIPAQUE IOHEXOL 300 MG/ML  SOLN COMPARISON:  11/30/2014  FINDINGS: Lower chest: No acute abnormality. Hepatobiliary: Liver is within normal limits. Gallbladder is well distended with multiple dependent gallstones. No wall thickening or pericholecystic fluid is seen. Pancreas: Unremarkable. No pancreatic ductal dilatation or surrounding inflammatory changes. Spleen: Normal in size without focal abnormality. Adrenals/Urinary Tract: Adrenal glands are within normal limits. Left kidney demonstrates no renal calculi or obstructive changes. Similar changes are noted on the right. Stable simple renal cysts are seen the largest of which lies in the upper pole of the right kidney measuring 7.9 cm. Normal excretion of contrast is noted bilaterally. Ureters are within normal limits. The bladder is decompressed. Stomach/Bowel: Colon is predominately decompressed with mild fluid within as well as some mild wall thickening and pericolonic inflammatory change consistent with a diffuse colitis. No abscess or perforation is noted. Small bowel and stomach are within normal limits. The appendix is unremarkable. Vascular/Lymphatic: Aortic atherosclerosis. No enlarged abdominal or pelvic lymph nodes. Reproductive: Prostate is enlarged but stable Other: Mild reactive fluid is noted related to the underlying colitis. Musculoskeletal: Degenerative changes of lumbar spine are noted. 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 Stable trabecular coarsening is noted in L3 similar to that seen on the prior exam. 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 IMPRESSION: Findings of colitis throughout the entire colon without evidence of perforation or abscess formation. This would  correspond with the patient's given clinical history. Multiple gallstones within well distended gallbladder. No inflammatory changes are seen. Chronic simple renal cysts bilaterally. 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 Electronically Signed   By: Inez Catalina M.D.   On: 05/28/2020 18:45    Procedures .Critical Care Performed by: Daleen Bo, MD Authorized by: Daleen Bo, MD   Critical care provider statement:    Critical care time (minutes):  35   Critical care start time:  05/24/2020 3:55 PM   Critical care end time:  05/30/2020 8:16 PM   Critical care time was exclusive of:  Separately billable procedures and treating other patients   Critical care was necessary to treat or prevent imminent or life-threatening deterioration of the following conditions:  Metabolic crisis   Critical care was time spent personally by me on the following activities:  Blood draw for specimens, development of treatment plan with patient or surrogate, discussions with consultants, evaluation of patient's response to treatment, examination of patient, obtaining history from patient or surrogate, ordering and performing treatments and interventions, ordering and review of laboratory studies, pulse oximetry, re-evaluation of patient's condition, review of old charts and ordering and review of radiographic studies     Medications Ordered in ED Medications  sodium chloride 0.9 % bolus 500 mL (has no administration in time range)  0.9 %  sodium chloride infusion (has no administration in time range)  iohexol (OMNIPAQUE) 300 MG/ML solution 75 mL (75 mLs Intravenous Contrast Given 06/12/2020 1810)    ED Course  I have reviewed the triage vital signs and the nursing notes.  Pertinent labs & imaging results that were available during my care of the patient were reviewed by me and considered in my medical decision making (see chart for details).  Clinical Course  as of 05/31/2020 2015  Sat May 20, 2020  1947 Nursing has collect a stool sample which smelled foul and "smelled like C. Difficile", according to the nurse. [EW]    Clinical Course User Index [  EW] Daleen Bo, MD   MDM Rules/Calculators/A&P                           Patient Vitals for the past 24 hrs:  BP Temp Temp src Pulse Resp SpO2 Height Weight  05/31/2020 1945 118/83 -- -- -- 15 -- -- --  05/28/2020 1845 107/73 -- -- (!) 110 (!) 25 91 % -- --  06/10/2020 1830 123/79 -- -- -- 12 -- -- --  06/01/2020 1815 126/79 -- -- 100 18 96 % -- --  06/09/2020 1730 -- -- -- -- -- 90 % -- --  06/09/2020 1715 117/80 -- -- (!) 109 15 -- -- --  06/01/2020 1700 112/83 -- -- (!) 108 16 93 % -- --  06/06/2020 1645 110/74 -- -- (!) 110 14 93 % -- --  05/15/2020 1353 (!) 124/91 99 F (37.2 C) Oral -- 17 98 % $Re'5\' 7"'nUA$  (1.702 m) 77.1 kg    8:15 PM Reevaluation with update and discussion. After initial assessment and treatment, an updated evaluation reveals no change in clinical status, patient and wife are agreeable to hospitalization.Daleen Bo   Medical Decision Making:  This patient is presenting for evaluation of diarrhea for 2-week, which does require a range of treatment options, and is a complaint that involves a moderate risk of morbidity and mortality. The differential diagnoses include infectious diarrhea, toxin diarrhea, nonspecific diarrhea. I decided to review old records, and in summary elderly male status post stroke, presenting with nonspecific diarrhea, and no reported recent antibiotic use.  I obtained additional historical information from Wife at bedside.  Clinical Laboratory Tests Ordered, included CBC, Metabolic panel, Urinalysis and Stool sample testing. Review indicates normal except white count high, left shift, sodium low, CO2 low, glucose high, alk phos stays high, total bilirubin high. Radiologic Tests Ordered, included CT abdomen pelvis.  I independently Visualized: Radiograph images,  which show colitis, entire colon, unspecified, without other complications    Critical Interventions-clinical evaluation, laboratory testing, CT imaging, IV fluids, stool testing ordered, observation and reevaluation  After These Interventions, the Patient was reevaluated and was found with abnormal findings, high white count and CT showing colitis.  This is concerning in light of diarrhea for 2 weeks, and presentation with elevated heart rate, general weakness, and decreased oral intake.  Patient will require admission to the hospital for ongoing management, collection of stool samples, possible GI consultation.  Petra Kuba of colitis is not clear, and there may not require antibiotics.  Doubt severe sepsis.  Doubt metabolic instability  CRITICAL CARE-yes Performed by: Daleen Bo  Nursing Notes Reviewed/ Care Coordinated Applicable Imaging Reviewed Interpretation of Laboratory Data incorporated into ED treatment  7:36 PM-Consult complete with hospitalist. Patient case explained and discussed.  He agrees to admit patient for further evaluation and treatment. Call ended at 7:52 PM    Final Clinical Impression(s) / ED Diagnoses Final diagnoses:  Colitis  Diarrhea, unspecified type    Rx / DC Orders ED Discharge Orders    None       Daleen Bo, MD 06/13/2020 2016

## 2020-05-20 NOTE — ED Provider Notes (Signed)
MSE was initiated and I personally evaluated the patient and placed orders (if any) at  1:56 PM on May 20, 2020.  The patient appears stable so that the remainder of the MSE may be completed by another provider.   Chief Complaint:  Diarrhea   HPI:   Patient states that he is primarily here for diarrhea, states that he has had over the past couple of days he states that the doctor sent him here today for virus that he was diagnosed with, however will not expand more on this.  States that he feels weak.  Unable to see any prior notes on Epic.  Denies any pain, no abdominal pain.   ROS:  Diarrhea, weakness   Physical Exam:  Gen:                Awake, no distress  HEENT:          Atraumatic  Resp:              Normal effort  Cardiac:          Normal rate  Abd:                Nondistended, nontender  MSK:              Moves extremities without difficulty  Neuro:            Speech clear,EOMS intact      Initiation of care has begun. The patient has been counseled on the process, plan, and necessity for staying for the completion/evaluation, and the remainder of the medical screening examination    Farrel Gordon, PA-C 05/28/2020 1400    Mancel Bale, MD 06/12/2020 2037

## 2020-05-20 NOTE — Progress Notes (Signed)
Lactic acid is 3, provider on call made aware.

## 2020-05-20 NOTE — ED Triage Notes (Signed)
Patient complains of diarrhea x 2 weeks or more. Denies abdominal pain, no vomiting. States that has general weakness as well

## 2020-05-20 NOTE — ED Notes (Signed)
Pt reports yellow watery diarrhea x 2 weeks, generalized weakness, decreased appetite

## 2020-05-20 NOTE — Progress Notes (Signed)
Arterial blood gas drawn while patient wearing oxygen set at 4lpm.

## 2020-05-20 NOTE — ED Notes (Signed)
Attempted report x1. 

## 2020-05-20 NOTE — ED Notes (Signed)
Pt aware of need for urine and stool specimens

## 2020-05-20 NOTE — Progress Notes (Signed)
Pharmacy Antibiotic Note  Michael Wells is a 80 y.o. male admitted on 06/01/2020 with intraabdominal infection. Pharmacy has been consulted for cefepime dosing. Metronidazole ordered by MD.  Plan: Cefepime 2g IV q12h Follow cultures, LOT, renal function  Height: 5\' 7"  (170.2 cm) Weight: 77.1 kg (170 lb) IBW/kg (Calculated) : 66.1  Temp (24hrs), Avg:99 F (37.2 C), Min:99 F (37.2 C), Max:99 F (37.2 C)  Recent Labs  Lab 06/01/2020 1429  WBC 34.0*  CREATININE 1.16    Estimated Creatinine Clearance: 49.1 mL/min (by C-G formula based on SCr of 1.16 mg/dL).    No Known Allergies  Antimicrobials this admission: Cefepime 5/7 >>  Metronidazole 5/7 >>    Thank you for allowing pharmacy to be a part of this patient's care.  7/7, PharmD, BCPS, [facility] Clinical Pharmacist [phone] Please check AMION for all [facility] Pharmacy numbers 06/01/2020

## 2020-05-20 NOTE — Progress Notes (Signed)
Pt has arrived to [facility]20. Alert and oriented x 4, VS stable, denied chest pain and SOB, no signs of acute distress. Pt identified appropriately, placed on cardiac monitor and CCMD notified. Pt oriented to room and equipment, instructed to call for assistance and how to use call bell. Call bell left within reach. Will continue to monitor pt and treat per MD orders.

## 2020-05-20 NOTE — H&P (Signed)
History and Physical    Michael BarlowHenry H Wells VHQ:[id number] DOB: 10-22-1940 DOA: 06/06/2020  PCP: Marva PandaMillsaps, Kimberly, NP  Patient coming from: Home   Chief Complaint:  Chief Complaint  Patient presents with  . 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  Patient is a somewhat poor historian due to severe lethargy and waxing and waning mentation during the interview.  A portion of the history has additionally been obtained from the wife who was at the bedside.  Approximately 3 weeks ago the patient began to experience diarrhea.  Patient describes this diarrhea as watery, nonbloody and frequent occurring upwards of 6-8 times daily.  Patient denies associated abdominal pain.  Patient complains of associated extremely poor appetite.  In the days and weeks that followed, as patient's diarrhea persisted patient experienced the patient began to experience progressively worsening generalized weakness.  Patient's weakness has become so severe in the past 3 to 4 days the patient is having difficulty with ambulation.  Wife also reports the patient has been extremely lethargic over the past several days as well due to his severe weakness.  Upon further questioning patient denies fevers, recent antibiotic use, sick contacts, recent travel, recent ingestion of undercooked food or recent contact with confirmed COVID-19 infection.  Patient symptoms continue to progressively worsen until he eventually presented to [facility] emergency department for evaluation.  Upon evaluation in the emergency department, patient was found to be lethargic and exhibits signs of substantial dehydration.  Patient was found to be hyponatremic tachycardic and possessing a substantial  leukocytosis.  CT imaging of the abdomen and pelvis revealed significant colitis) acute infectious colitis.  Patient was initiated on intravenous isotonic fluids.  C. difficile PCR testing was sent off and is pending.  Hospitalist group has now been called to assess the patient for admission to the hospital.  Review of Systems:   Review of Systems  Constitutional: Positive for malaise/fatigue.  Gastrointestinal: Positive for diarrhea.  Neurological: Positive for weakness.  All other systems reviewed and are negative.   Past Medical History:  Diagnosis Date  . Anxiety   . Carotid artery stenosis, symptomatic, right 05/31/2020  . Chronic pain   . H/O sleep disturbance   . History of embolic stroke 05/18/2020    Past Surgical History:  Procedure Laterality Date  . IR ANGIO VERTEBRAL SEL SUBCLAVIAN INNOMINATE UNI L MOD SED  03/10/2020  . IR INTRAVSC STENT CERV CAROTID W/EMB-PROT MOD SED INCL ANGIO  03/10/2020  . IR STENT PLACEMENT ANTE CAROTID INC ANGIO  03/10/2020      . IR US GUIDE VASC ACCESS RIGHT  03/10/2020  . RADIOLOGY WITH ANESTHESIA N/A 03/10/2020   Procedure: IR WITH ANESTHESIA;  Surgeon: Radiologist, Medication, MD;  Location: [facility] OR;  Service: Radiology;  Laterality: N/A;     reports that he has never smoked. He does not have any smokeless tobacco history on file. He reports current alcohol use. He reports that he does not use drugs.  No Known Allergies  Family History  Problem Relation Age of Onset  . Cancer Mother        cervical  . Cancer Father        prostate  . Arthritis Sister   . Drug abuse Brother   . Mental retardation  Brother   . Aneurysm Brother      Prior to Admission medications   Medication Sig Start Date End Date Taking? Authorizing Provider  aspirin 81 MG EC tablet Take 1 tablet (81 mg total) by mouth daily. Swallow whole. 03/11/20  Yes Azucena Fallen, MD  atorvastatin (LIPITOR) 40 MG tablet Take 1 tablet (40 mg total) by mouth daily. 03/11/20   Yes Azucena Fallen, MD  HYDROcodone-acetaminophen (NORCO/VICODIN) 5-325 MG tablet Take 1 tablet by mouth every 6 (six) hours as needed for moderate pain.   Yes [provider]  ticagrelor (BRILINTA) 90 MG TABS tablet Take 1 tablet (90 mg total) by mouth 2 (two) times daily. 05/05/20  Yes Mickie Kay, NP    Physical Exam: Vitals:   05/27/2020 2030 06/09/2020 2045 05/19/2020 2053 06/11/2020 2100  BP: 120/86 127/90  133/84  Pulse: (!) 107 (!) 104  99  Resp: (!) 22 14  14   Temp:      TempSrc:      SpO2: (!) 83% 93% 95% 96%  Weight:      Height:        Constitutional: Patient is extremely lethargic but arousable and oriented x3, patient is in distress due to rectal pain. Skin: Notable shallow ulcerations with skin breakdown of the bilateral buttocks surrounding the anus.  Irregularly shaped wounds without foul smell or drainage no evidence of induration.  Extremely poor skin turgor noted. Eyes: Pupils are equally reactive to light.  No evidence of scleral icterus or conjunctival pallor.  ENMT: Extremely dry mucous membranes noted.  Posterior pharynx clear of any exudate or lesions.   Neck: normal, supple, no masses, no thyromegaly.  No evidence of jugular venous distension.   Respiratory: clear to auscultation bilaterally, no wheezing, no crackles. Normal respiratory effort. No accessory muscle use.  Cardiovascular: Tachycardic rate with regular rhythm, no murmurs / rubs / gallops. No extremity edema. 2+ pedal pulses. No carotid bruits.  Chest:   Nontender without crepitus or deformity.   Back:   Nontender without crepitus or deformity. Abdomen: Notable generalized abdominal tenderness.  Abdomen is soft however.  No evidence of intra-abdominal masses.  Hyperactive bowel sounds noted in all quadrants.   Musculoskeletal: No joint deformity upper and lower extremities. Good ROM, no contractures. Normal muscle tone.  Neurologic: CN 2-12 grossly intact. Sensation intact.  Patient  moving all 4 extremities spontaneously.  Patient is following all commands.  Patient is responsive to verbal stimuli.   Psychiatric: Patient exhibits depressed mood with flat affect.  Currently does not seem to possess insight as to their current situation.      Labs on Admission: I have personally reviewed following labs and imaging studies -   CBC: Recent Labs  Lab 05/16/2020 1429 06/11/2020 2141  WBC 34.0*  --   NEUTROABS 32.3*  --   HGB 16.8 13.6  HCT 49.1 40.0  MCV 96.8  --   PLT 295  --    Basic Metabolic Panel: Recent Labs  Lab 06/03/2020 1429 06/05/2020 2141  NA 131* 133*  K 4.2 2.9*  CL 101  --   CO2 19*  --   GLUCOSE 148*  --   BUN 17  --   CREATININE 1.16  --   CALCIUM 9.9  --    GFR: Estimated Creatinine Clearance: 49.1 mL/min (by C-G formula based on SCr of 1.16 mg/dL). Liver Function Tests: Recent Labs  Lab 05/30/2020 1429  AST 30  ALT 20  ALKPHOS 129*  BILITOT 3.7*  PROT 6.1*  ALBUMIN 2.5*   No results for input(s): LIPASE, AMYLASE in the last 168 hours. No results for input(s): AMMONIA in the last 168 hours. Coagulation Profile: No results for input(s): INR, PROTIME in the last 168 hours. Cardiac Enzymes: No results for input(s): CKTOTAL, CKMB, CKMBINDEX, TROPONINI in the last 168 hours. BNP (last 3 results) No results for input(s): PROBNP in the last 8760 hours. HbA1C: No results for input(s): HGBA1C in the last 72 hours. CBG: No results for input(s): GLUCAP in the last 168 hours. Lipid Profile: No results for input(s): CHOL, HDL, LDLCALC, TRIG, CHOLHDL, LDLDIRECT in the last 72 hours. Thyroid Function Tests: No results for input(s): TSH, T4TOTAL, FREET4, T3FREE, THYROIDAB in the last 72 hours. Anemia Panel: No results for input(s): VITAMINB12, FOLATE, FERRITIN, TIBC, IRON, RETICCTPCT in the last 72 hours. Urine analysis: No results found for: COLORURINE, APPEARANCEUR, LABSPEC, PHURINE, GLUCOSEU, HGBUR, BILIRUBINUR, KETONESUR, PROTEINUR,  UROBILINOGEN, NITRITE, LEUKOCYTESUR  Radiological Exams on Admission - Personally Reviewed: DG Chest 1 View  Result Date: 06/09/2020 CLINICAL DATA:  80 year old male with shortness of breath. EXAM: CHEST  1 VIEW COMPARISON:  None. FINDINGS: The lungs are clear. There is no pleural effusion pneumothorax. The cardiac silhouette is within limits. No acute osseous pathology. Atherosclerotic calcification of the aorta. IMPRESSION: No active disease. Electronically Signed   By: Elgie Collard M.D.   On: 05/16/2020 21:31   CT Abdomen Pelvis W Contrast  Result Date: 05/26/2020 CLINICAL DATA:  I diarrhea for 2 more weeks EXAM: CT ABDOMEN AND PELVIS WITH CONTRAST TECHNIQUE: Multidetector CT imaging of the abdomen and pelvis was performed using the standard protocol following bolus administration of intravenous contrast. CONTRAST:  71mL OMNIPAQUE IOHEXOL 300 MG/ML  SOLN COMPARISON:  11/30/2014 FINDINGS: Lower chest: No acute abnormality. Hepatobiliary: Liver is within normal limits. Gallbladder is well distended with multiple dependent gallstones. No wall thickening or pericholecystic fluid is seen. Pancreas: Unremarkable. No pancreatic ductal dilatation or surrounding inflammatory changes. Spleen: Normal in size without focal abnormality. Adrenals/Urinary Tract: Adrenal glands are within normal limits. Left kidney demonstrates no renal calculi or obstructive changes. Similar changes are noted on the right. Stable simple renal cysts are seen the largest of which lies in the upper pole of the right kidney measuring 7.9 cm. Normal excretion of contrast is noted bilaterally. Ureters are within normal limits. The bladder is decompressed. Stomach/Bowel: Colon is predominately decompressed with mild fluid within as well as some mild wall thickening and pericolonic inflammatory change consistent with a diffuse colitis. No abscess or perforation is noted. Small bowel and stomach are within normal limits. The appendix is  unremarkable. Vascular/Lymphatic: Aortic atherosclerosis. No enlarged abdominal or pelvic lymph nodes. Reproductive: Prostate is enlarged but stable Other: Mild reactive fluid is noted related to the underlying colitis. Musculoskeletal: Degenerative changes of lumbar spine are noted. 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 Stable trabecular coarsening is noted in L3 similar to that seen on the prior exam. 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 IMPRESSION: Findings of colitis throughout the entire colon without evidence of perforation or abscess formation. This would correspond with the patient's given clinical history. Multiple gallstones within well distended gallbladder. No inflammatory changes are seen. Chronic simple renal cysts bilaterally. 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 Electronically Signed   By: Alcide Clever M.D.   On: 06/11/2020  18:45    EKG: Personally reviewed.  Rhythm is sinus tachycardia with heart rate of 114 bpm.  Notable PVCs no dynamic ST segment changes appreciated.  Assessment/Plan Principal Problem:   Acute colitis with concurrent sepsis   Patient presenting with several week history of severe diarrhea with CT evidence of diffuse colitis  Multiple SIRS criteria including tachycardia and leukocytosis in the setting of organ dysfunction with encephalopathy concerning for developing sepsis  Considering substantial leukocytosis colitis is thought to be likely infectious in etiology, C. difficile is present  Hydrating patient aggressively with intravenous isotonic fluids  Clear liquid diet  Awaiting C. difficile PCR, if positive will initiate oral vancomycin, if negative will initiate intravenous cefepime and Flagyl.  Blood cultures ordered  Active Problems:   Acute metabolic encephalopathy   Thought to be secondary to  underlying volume depletion and infection  Hydrating patient aggressively with intravenous isotonic fluids  We will additionally initiate antibiotic therapy as appropriate  TSH, vitamin B12 and folate also ordered and are pending  Monitoring for resolution    Acute respiratory failure with hypoxia (HCC)   Surprisingly, patient is additionally been exhibiting substantial hypoxia with oxygen saturations having to 84 and 85% on room air  Lungs are clear on pulmonary exam  ABG obtained revealing a PO2 of 86 on 4 L of supplemental oxygen suggestive of substantial hypoxic respiratory failure.  AA gradient noted to be 142.2 which is markedly elevated\  Obtaining stat chest x-ray to evaluate for concurrent pneumonia or other acute cardiopulmonary disease.  If chest x-ray is clear we will proceed with CT angiogram of the chest to rule out pulmonary embolism    History of embolic stroke   Continuing home regimen of aspirin and Brilinta for concurrent right carotid stenosis status post stenting 02/2020    Skin ulcer of buttock, limited to breakdown of skin (HCC)   Notable irregular ulcerations over the anus in a circumferential fashion secondary to frequent stooling over the past several weeks  Asking nursing to apply a barrier cream twice daily  Wound care consultation placed    Hyponatremia   Likely mild hypovolemic hyponatremia  Hydrating patient with intravenous isotonic fluid  Monitoring for resolution with serial chemistries    Hyperglycemia   Hemoglobin A1c pending   Code Status:  Full code Family Communication: Wife is at bedside who has been updated on plan of care.  Status is: Observation  The patient remains OBS appropriate and will d/c before 2 midnights.  Dispo: The patient is from: Home              Anticipated d/c is to: Home              Patient currently is not medically stable to d/c.   Difficult to place patient No        Marinda Elk  MD [facility] Pager [phone]  If 7PM-7AM, please contact night-coverage [email or web address] Use universal [place] password for that web site. If you do not have the password, please call the hospital operator.  05/29/2020, 9:47 PM

## 2020-05-21 ENCOUNTER — Encounter (HOSPITAL_COMMUNITY): Payer: Self-pay | Admitting: Internal Medicine

## 2020-05-21 DIAGNOSIS — E538 Deficiency of other specified B group vitamins: Secondary | ICD-10-CM | POA: Diagnosis present

## 2020-05-21 DIAGNOSIS — Z515 Encounter for palliative care: Secondary | ICD-10-CM | POA: Diagnosis not present

## 2020-05-21 DIAGNOSIS — N390 Urinary tract infection, site not specified: Secondary | ICD-10-CM | POA: Diagnosis present

## 2020-05-21 DIAGNOSIS — I6521 Occlusion and stenosis of right carotid artery: Secondary | ICD-10-CM | POA: Diagnosis present

## 2020-05-21 DIAGNOSIS — Z20822 Contact with and (suspected) exposure to covid-19: Secondary | ICD-10-CM | POA: Diagnosis present

## 2020-05-21 DIAGNOSIS — R54 Age-related physical debility: Secondary | ICD-10-CM | POA: Diagnosis present

## 2020-05-21 DIAGNOSIS — E871 Hypo-osmolality and hyponatremia: Secondary | ICD-10-CM | POA: Diagnosis present

## 2020-05-21 DIAGNOSIS — I712 Thoracic aortic aneurysm, without rupture: Secondary | ICD-10-CM | POA: Diagnosis present

## 2020-05-21 DIAGNOSIS — G9341 Metabolic encephalopathy: Secondary | ICD-10-CM | POA: Diagnosis present

## 2020-05-21 DIAGNOSIS — F0150 Vascular dementia without behavioral disturbance: Secondary | ICD-10-CM | POA: Diagnosis present

## 2020-05-21 DIAGNOSIS — E785 Hyperlipidemia, unspecified: Secondary | ICD-10-CM | POA: Diagnosis present

## 2020-05-21 DIAGNOSIS — E861 Hypovolemia: Secondary | ICD-10-CM | POA: Diagnosis present

## 2020-05-21 DIAGNOSIS — Z66 Do not resuscitate: Secondary | ICD-10-CM | POA: Diagnosis not present

## 2020-05-21 DIAGNOSIS — R64 Cachexia: Secondary | ICD-10-CM | POA: Diagnosis present

## 2020-05-21 DIAGNOSIS — A0472 Enterocolitis due to Clostridium difficile, not specified as recurrent: Secondary | ICD-10-CM | POA: Diagnosis present

## 2020-05-21 DIAGNOSIS — Z9189 Other specified personal risk factors, not elsewhere classified: Secondary | ICD-10-CM | POA: Diagnosis not present

## 2020-05-21 DIAGNOSIS — A414 Sepsis due to anaerobes: Secondary | ICD-10-CM | POA: Diagnosis present

## 2020-05-21 DIAGNOSIS — R739 Hyperglycemia, unspecified: Secondary | ICD-10-CM | POA: Diagnosis present

## 2020-05-21 DIAGNOSIS — A419 Sepsis, unspecified organism: Secondary | ICD-10-CM | POA: Diagnosis not present

## 2020-05-21 DIAGNOSIS — Z8673 Personal history of transient ischemic attack (TIA), and cerebral infarction without residual deficits: Secondary | ICD-10-CM | POA: Diagnosis not present

## 2020-05-21 DIAGNOSIS — R197 Diarrhea, unspecified: Secondary | ICD-10-CM | POA: Diagnosis present

## 2020-05-21 DIAGNOSIS — J9601 Acute respiratory failure with hypoxia: Secondary | ICD-10-CM | POA: Diagnosis present

## 2020-05-21 DIAGNOSIS — M545 Low back pain, unspecified: Secondary | ICD-10-CM | POA: Diagnosis present

## 2020-05-21 DIAGNOSIS — R531 Weakness: Secondary | ICD-10-CM | POA: Diagnosis not present

## 2020-05-21 DIAGNOSIS — R627 Adult failure to thrive: Secondary | ICD-10-CM | POA: Diagnosis not present

## 2020-05-21 DIAGNOSIS — G8929 Other chronic pain: Secondary | ICD-10-CM | POA: Diagnosis present

## 2020-05-21 DIAGNOSIS — J9811 Atelectasis: Secondary | ICD-10-CM | POA: Diagnosis not present

## 2020-05-21 DIAGNOSIS — R6520 Severe sepsis without septic shock: Secondary | ICD-10-CM | POA: Diagnosis present

## 2020-05-21 DIAGNOSIS — E860 Dehydration: Secondary | ICD-10-CM | POA: Diagnosis present

## 2020-05-21 DIAGNOSIS — E876 Hypokalemia: Secondary | ICD-10-CM | POA: Diagnosis not present

## 2020-05-21 DIAGNOSIS — K529 Noninfective gastroenteritis and colitis, unspecified: Secondary | ICD-10-CM | POA: Diagnosis present

## 2020-05-21 DIAGNOSIS — R7989 Other specified abnormal findings of blood chemistry: Secondary | ICD-10-CM | POA: Diagnosis not present

## 2020-05-21 LAB — COMPREHENSIVE METABOLIC PANEL
ALT: 17 U/L (ref 0–44)
AST: 19 U/L (ref 15–41)
Albumin: 1.9 g/dL — ABNORMAL LOW (ref 3.5–5.0)
Alkaline Phosphatase: 86 U/L (ref 38–126)
Anion gap: 6 (ref 5–15)
BUN: 17 mg/dL (ref 8–23)
CO2: 23 mmol/L (ref 22–32)
Calcium: 8.6 mg/dL — ABNORMAL LOW (ref 8.9–10.3)
Chloride: 104 mmol/L (ref 98–111)
Creatinine, Ser: 0.98 mg/dL (ref 0.61–1.24)
GFR, Estimated: >60 mL/min (ref >60)
Glucose, Bld: 97 mg/dL (ref 70–99)
Potassium: 3.6 mmol/L (ref 3.5–5.1)
Sodium: 133 mmol/L — ABNORMAL LOW (ref 135–145)
Total Bilirubin: 2.4 mg/dL — ABNORMAL HIGH (ref 0.3–1.2)
Total Protein: 4.3 g/dL — ABNORMAL LOW (ref 6.5–8.1)

## 2020-05-21 LAB — URINALYSIS, ROUTINE W REFLEX MICROSCOPIC
Bilirubin Urine: NEGATIVE
Glucose, UA: NEGATIVE mg/dL
Hgb urine dipstick: NEGATIVE
Ketones, ur: NEGATIVE mg/dL
Nitrite: POSITIVE — AB
Protein, ur: NEGATIVE mg/dL
Specific Gravity, Urine: >1.046 — ABNORMAL HIGH (ref 1.005–1.030)
pH: 6 (ref 5.0–8.0)

## 2020-05-21 LAB — CBC WITH DIFFERENTIAL/PLATELET
Abs Immature Granulocytes: 0.17 10*3/uL — ABNORMAL HIGH (ref 0.00–0.07)
Basophils Absolute: 0.1 10*3/uL (ref 0.0–0.1)
Basophils Relative: 0 %
Eosinophils Absolute: 0.2 10*3/uL (ref 0.0–0.5)
Eosinophils Relative: 1 %
HCT: 41.2 % (ref 39.0–52.0)
Hemoglobin: 13.9 g/dL (ref 13.0–17.0)
Immature Granulocytes: 1 %
Lymphocytes Relative: 4 %
Lymphs Abs: 0.9 10*3/uL (ref 0.7–4.0)
MCH: 33.7 pg (ref 26.0–34.0)
MCHC: 33.7 g/dL (ref 30.0–36.0)
MCV: 100 fL (ref 80.0–100.0)
Monocytes Absolute: 1.3 10*3/uL — ABNORMAL HIGH (ref 0.1–1.0)
Monocytes Relative: 5 %
Neutro Abs: 24.1 10*3/uL — ABNORMAL HIGH (ref 1.7–7.7)
Neutrophils Relative %: 89 %
Platelets: 211 10*3/uL (ref 150–400)
RBC: 4.12 MIL/uL — ABNORMAL LOW (ref 4.22–5.81)
RDW: 14.6 % (ref 11.5–15.5)
WBC Morphology: INCREASED
WBC: 26.9 10*3/uL — ABNORMAL HIGH (ref 4.0–10.5)
nRBC: 0 % (ref 0.0–0.2)

## 2020-05-21 LAB — LACTIC ACID, PLASMA: Lactic Acid, Venous: 2.6 mmol/L (ref 0.5–1.9)

## 2020-05-21 LAB — GASTROINTESTINAL PANEL BY PCR, STOOL (REPLACES STOOL CULTURE)

## 2020-05-21 LAB — PROTIME-INR
INR: 1.2 (ref 0.8–1.2)
Prothrombin Time: 14.7 seconds (ref 11.4–15.2)

## 2020-05-21 LAB — FOLATE: Folate: 8.5 ng/mL (ref >5.9)

## 2020-05-21 LAB — CORTISOL-AM, BLOOD: Cortisol - AM: 78.5 ug/dL — ABNORMAL HIGH (ref 6.7–22.6)

## 2020-05-21 LAB — HEMOGLOBIN A1C
Hgb A1c MFr Bld: 5.8 % — ABNORMAL HIGH (ref 4.8–5.6)
Mean Plasma Glucose: 119.76 mg/dL

## 2020-05-21 LAB — TSH: TSH: 0.995 u[IU]/mL (ref 0.350–4.500)

## 2020-05-21 LAB — MAGNESIUM: Magnesium: 1.6 mg/dL — ABNORMAL LOW (ref 1.7–2.4)

## 2020-05-21 MED ORDER — ORAL CARE MOUTH RINSE
15.0000 mL | Freq: Two times a day (BID) | OROMUCOSAL | Status: DC
  Administered 2020-05-21 – 2020-06-20 (×56): 15 mL via OROMUCOSAL

## 2020-05-21 MED ORDER — MAGNESIUM SULFATE 4 GM/100ML IV SOLN
4.0000 g | Freq: Once | INTRAVENOUS | Status: AC
  Administered 2020-05-21: 4 g via INTRAVENOUS
  Filled 2020-05-21 (×2): qty 100

## 2020-05-21 MED ORDER — LACTATED RINGERS IV SOLN: INTRAVENOUS | Status: DC

## 2020-05-21 NOTE — Progress Notes (Signed)
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 Pt was found to have significant colitis on CT abd with stools being pos for Cdiff at time of presentation  Assessment & Plan:   Principal Problem:   Acute colitis Active Problems:   Sepsis (HCC)   History of embolic stroke   Skin ulcer of buttock, limited to breakdown of skin (HCC)   Hyponatremia   Acute metabolic encephalopathy   Acute respiratory failure with hypoxia (HCC)   Hyperglycemia   Clostridium difficile colitis  Principal Problem:   Acute Cdiff colitis with severe sepsis all present on admission    Patient presenting with several week history of severe diarrhea with CT evidence of diffuse colitis  Multiple SIRS criteria including tachycardia and leukocytosis in the setting of organ dysfunction with encephalopathy concerning for developing sepsis. Lactate peaked to 3.0  Continued on PO vanc  Continue with aggressive IVF hydration per below  Recheck bmet in AM  Active Problems:   Acute metabolic encephalopathy   Likely secondary to underlying volume depletion and infection  Hydrating patient aggressively with intravenous isotonic fluids  We will additionally initiate antibiotic therapy as appropriate  TSH, vitamin B12 and folate also ordered and are pending  Monitoring for resolution    Acute respiratory failure with hypoxia (HCC)   CT chest reviewed, neg for PE  Incidental findings of 4.2cm ascending aortic aneurysm with recommendation for semi-annual imaging follow up by CTA or MRA    History of embolic stroke   Continuing home regimen of aspirin  and Brilinta for concurrent right carotid stenosis status post stenting 02/2020    Skin ulcer of buttock, limited to breakdown of skin (HCC)   Notable irregular ulcerations over the anus in a circumferential fashion secondary to frequent stooling over the past several weeks  Asking nursing to apply a barrier cream twice daily  Wound care consultation was placed    Hyponatremia   Likely mild hypovolemic hyponatremia  Hydrating patient with intravenous isotonic fluid  Recheck bmet in AM    Hyperglycemia   Hemoglobin A1c of 5.8   DVT prophylaxis: Lovenox subq Code Status: Full Family Communication: Pt in room, family not at bedside  Status is: Inpatient  Remains inpatient appropriate because:Inpatient level of care appropriate due to severity of illness   Dispo: The patient is from: Home              Anticipated d/c is to: Home              Patient currently is not medically stable to d/c.   Difficult to place patient No       Consultants:     Procedures:     Antimicrobials: Anti-infectives (From admission, onward)   Start     Dose/Rate Route Frequency Ordered Stop   05/21/20 1000  ceFEPIme (MAXIPIME) 2 g in sodium chloride 0.9 % 100 mL IVPB  Status:  Discontinued        2 g 200 mL/hr over 30 Minutes Intravenous Every 12 hours 05/22/2020 2150 06/08/2020 2202   05/26/2020 2215  vancomycin (VANCOCIN) 50 mg/mL oral solution 125 mg        125 mg Oral 4  times daily 05/14/2020 2202 05/30/20 2159   05/19/2020 2145  ceFEPIme (MAXIPIME) 2 g in sodium chloride 0.9 % 100 mL IVPB  Status:  Discontinued        2 g 200 mL/hr over 30 Minutes Intravenous  Once 05/26/2020 2142 05/14/2020 2202   06/04/2020 2145  metroNIDAZOLE (FLAGYL) IVPB 500 mg  Status:  Discontinued        500 mg 100 mL/hr over 60 Minutes Intravenous Every 8 hours 05/19/2020 2142 05/21/2020 2202       Subjective: Feeling very weak  Objective: Vitals:   05/21/20 0700 05/21/20 0710 05/21/20 1500 05/21/20 1600   BP: 98/60   104/65  Pulse: (!) 30 (!) 126  85  Resp: Temp:    97.8 F (36.6 C)  TempSrc:    Oral  SpO2: (!) 75% 98% (P) 95% 95%  Weight:      Height:        Intake/Output Summary (Last 24 hours) at 05/21/2020 1824 Last data filed at 05/21/2020 1500 Gross per 24 hour  Intake 2864.66 ml  Output --  Net 2864.66 ml   Filed Weights   05/31/2020 1353  Weight: 77.1 kg    Examination: General exam: Awake, laying in bed, in nad, mucus membranes are dry Respiratory system: Normal respiratory effort, no wheezing Cardiovascular system: regular rate, s1, s2 Gastrointestinal system: Soft, nondistended, positive BS Central nervous system: CN2-12 grossly intact, strength intact Extremities: Perfused, no clubbing Skin: Normal skin turgor, no notable skin lesions seen Psychiatry: Mood normal // no visual hallucinations   Data Reviewed: I have personally reviewed following labs and imaging studies  CBC: Recent Labs  Lab 05/26/2020 1429 05/25/2020 2141 05/21/20 0017  WBC 34.0*  --  26.9*  NEUTROABS 32.3*  --  24.1*  HGB 16.8 13.6 13.9  HCT 49.1 40.0 41.2  MCV 96.8  --  100.0  PLT 295  --  211   Basic Metabolic Panel: Recent Labs  Lab 05/16/2020 1429 06/06/2020 2141 05/21/20 0017  NA 131* 133* 133*  K 4.2 2.9* 3.6  CL 101  --  104  CO2 19*  --  23  GLUCOSE 148*  --  97  BUN 17  --  17  CREATININE 1.16  --  0.98  CALCIUM 9.9  --  8.6*  MG  --   --  1.6*   GFR: Estimated Creatinine Clearance: 58.1 mL/min (by C-G formula based on SCr of 0.98 mg/dL). Liver Function Tests: Recent Labs  Lab 06/12/2020 1429 05/21/20 0017  AST 30 19  ALT 20 17  ALKPHOS 129* 86  BILITOT 3.7* 2.4*  PROT 6.1* 4.3*  ALBUMIN 2.5* 1.9*   No results for input(s): LIPASE, AMYLASE in the last 168 hours. No results for input(s): AMMONIA in the last 168 hours. Coagulation Profile: Recent Labs  Lab 05/21/20 0017  INR 1.2   Cardiac Enzymes: No results for input(s): CKTOTAL, CKMB, CKMBINDEX,  TROPONINI in the last 168 hours. BNP (last 3 results) No results for input(s): PROBNP in the last 8760 hours. HbA1C: Recent Labs    05/21/20 0017  HGBA1C 5.8*   CBG: No results for input(s): GLUCAP in the last 168 hours. Lipid Profile: No results for input(s): CHOL, HDL, LDLCALC, TRIG, CHOLHDL, LDLDIRECT in the last 72 hours. Thyroid Function Tests: Recent Labs    05/29/2020 2138  TSH 0.995   Anemia Panel: Recent Labs    05/15/2020 2138  VITAMINB12 86*  FOLATE 8.5  Sepsis Labs: Recent Labs  Lab 06/06/2020 2143 05/21/20 0017  LATICACIDVEN 3.0* 2.6*    Recent Results (from the past 240 hour(s))  Resp Panel by RT-PCR (Flu A&B, Covid) Nasopharyngeal Swab     Status: None   Collection Time: 05/28/2020  2:01 PM   Specimen: Nasopharyngeal Swab; Nasopharyngeal(NP) swabs in vial transport medium  Result Value Ref Range Status   SARS Coronavirus 2 by RT PCR NEGATIVE NEGATIVE Final    Comment: (NOTE) SARS-CoV-2 target nucleic acids are NOT DETECTED.  The SARS-CoV-2 RNA is generally detectable in upper respiratory specimens during the acute phase of infection. The lowest concentration of SARS-CoV-2 viral copies this assay can detect is 138 copies/mL. A negative result does not preclude SARS-Cov-2 infection and should not be used as the sole basis for treatment or other patient management decisions. A negative result may occur with  improper specimen collection/handling, submission of specimen other than nasopharyngeal swab, presence of viral mutation(s) within the areas targeted by this assay, and inadequate number of viral copies(<138 copies/mL). A negative result must be combined with clinical observations, patient history, and epidemiological information. The expected result is Negative.  Fact Sheet for Patients:  BloggerCourse.comhttps://www.fda.gov/media/152166/download  Fact Sheet for Healthcare Providers:  SeriousBroker.ithttps://www.fda.gov/media/152162/download  This test is no t yet approved or  cleared by the [place] FDA and  has been authorized for detection and/or diagnosis of SARS-CoV-2 by FDA under an Emergency Use Authorization (EUA). This EUA will remain  in effect (meaning this test can be used) for the duration of the COVID-19 declaration under Section 564(b)(1) of the Act, 21 U.S.C.section 360bbb-3(b)(1), unless the authorization is terminated  or revoked sooner.       Influenza A by PCR NEGATIVE NEGATIVE Final   Influenza B by PCR NEGATIVE NEGATIVE Final    Comment: (NOTE) The Xpert Xpress SARS-CoV-2/FLU/RSV plus assay is intended as an aid in the diagnosis of influenza from Nasopharyngeal swab specimens and should not be used as a sole basis for treatment. Nasal washings and aspirates are unacceptable for Xpert Xpress SARS-CoV-2/FLU/RSV testing.  Fact Sheet for Patients: BloggerCourse.comhttps://www.fda.gov/media/152166/download  Fact Sheet for Healthcare Providers: SeriousBroker.ithttps://www.fda.gov/media/152162/download  This test is not yet approved or cleared by the [place] FDA and has been authorized for detection and/or diagnosis of SARS-CoV-2 by FDA under an Emergency Use Authorization (EUA). This EUA will remain in effect (meaning this test can be used) for the duration of the COVID-19 declaration under Section 564(b)(1) of the Act, 21 U.S.C. section 360bbb-3(b)(1), unless the authorization is terminated or revoked.  Performed at [facility], [address] [place], [place] [address]   Gastrointestinal Panel by PCR , Stool     Status: None   Collection Time: 06/13/2020  7:33 PM   Specimen: STOOL  Result Value Ref Range Status   Campylobacter species NOT DETECTED NOT DETECTED Final   Plesimonas shigelloides NOT DETECTED NOT DETECTED Final   Salmonella species NOT DETECTED NOT DETECTED Final   Yersinia enterocolitica NOT DETECTED NOT DETECTED Final   Vibrio species NOT DETECTED NOT DETECTED Final   Vibrio cholerae NOT DETECTED NOT DETECTED Final    Enteroaggregative E coli (EAEC) NOT DETECTED NOT DETECTED Final   Enteropathogenic E coli (EPEC) NOT DETECTED NOT DETECTED Final   Enterotoxigenic E coli (ETEC) NOT DETECTED NOT DETECTED Final   Shiga like toxin producing E coli (STEC) NOT DETECTED NOT DETECTED Final   Shigella/Enteroinvasive E coli (EIEC) NOT DETECTED NOT DETECTED Final   Cryptosporidium NOT DETECTED NOT  DETECTED Final   Cyclospora cayetanensis NOT DETECTED NOT DETECTED Final   Entamoeba histolytica NOT DETECTED NOT DETECTED Final   Giardia lamblia NOT DETECTED NOT DETECTED Final   Adenovirus F40/41 NOT DETECTED NOT DETECTED Final   Astrovirus NOT DETECTED NOT DETECTED Final   Norovirus GI/GII NOT DETECTED NOT DETECTED Final   Rotavirus A NOT DETECTED NOT DETECTED Final   Sapovirus (I, II, IV, and V) NOT DETECTED NOT DETECTED Final    Comment: Performed at [facility], [address] [place], [place] [address]  C Difficile Quick Screen w PCR reflex     Status: Abnormal   Collection Time: 2020/06/07  7:39 PM   Specimen: STOOL  Result Value Ref Range Status   C Diff antigen POSITIVE (A) NEGATIVE Final   C Diff toxin NEGATIVE NEGATIVE Final   C Diff interpretation Results are indeterminate. See PCR results.  Final    Comment: Performed at [facility], [address], [place], [place] [address]  C. Diff by PCR, Reflexed     Status: Abnormal   Collection Time: Jun 07, 2020  7:39 PM  Result Value Ref Range Status   Toxigenic C. Difficile by PCR POSITIVE (A) NEGATIVE Final    Comment: Positive for toxigenic C. difficile with little to no toxin production. Only treat if clinical presentation suggests symptomatic illness. Performed at [facility], [address], [place], [place] [address]   Culture, blood (routine x 2)     Status: None (Preliminary result)   Collection Time: 06/07/20  9:43 PM   Specimen: BLOOD LEFT ARM  Result Value Ref Range Status   Specimen Description BLOOD LEFT ARM  Final    Special Requests   Final    BOTTLES DRAWN AEROBIC AND ANAEROBIC Blood Culture results may not be optimal due to an inadequate volume of blood received in culture bottles   Culture   Final    NO GROWTH < 12 HOURS Performed at [facility], [address], [place], [place] [address]    Report Status PENDING  Incomplete  Culture, blood (routine x 2)     Status: None (Preliminary result)   Collection Time: 2020-06-07  9:45 PM   Specimen: BLOOD  Result Value Ref Range Status   Specimen Description BLOOD SITE NOT SPECIFIED  Final   Special Requests   Final    BOTTLES DRAWN AEROBIC AND ANAEROBIC Blood Culture results may not be optimal due to an inadequate volume of blood received in culture bottles   Culture   Final    NO GROWTH < 12 HOURS Performed at [facility], [address], [place], [place] [address]    Report Status PENDING  Incomplete     Radiology Studies: DG Chest 1 View  Result Date: 06/07/20 CLINICAL DATA:  80 year old male with shortness of breath. EXAM: CHEST  1 VIEW COMPARISON:  None. FINDINGS: The lungs are clear. There is no pleural effusion pneumothorax. The cardiac silhouette is within limits. No acute osseous pathology. Atherosclerotic calcification of the aorta. IMPRESSION: No active disease. Electronically Signed   By: Elgie Collard M.D.   On: 2020-06-07 21:31   CT ANGIO CHEST PE W OR WO CONTRAST  Result Date: 2020-06-07 CLINICAL DATA:  Acute respiratory failure with hypoxia. Recent colitis. Clostridium difficile. Possibly sepsis. EXAM: CT ANGIOGRAPHY CHEST WITH CONTRAST TECHNIQUE: Multidetector CT imaging of the chest was performed using the standard protocol during bolus administration of intravenous contrast. Multiplanar CT image reconstructions  and MIPs were obtained to evaluate the vascular anatomy. CONTRAST:  31mL OMNIPAQUE IOHEXOL 350 MG/ML SOLN COMPARISON:  CT abdomen pelvis 06/06/2020 FINDINGS: Cardiovascular: Satisfactory opacification of the  pulmonary arteries to the segmental level. No evidence of pulmonary embolism. Normal heart size. No significant pericardial effusion. The ascending aorta measures up to 4.2 cm on axial imaging. The descending thoracic aorta is normal in caliber. No definite findings of aortic dissection; however, limited evaluation due to preferential opacification of the pulmonary arterial system. No findings of dissection of the partially visualized and opacified thoracic aorta on the CT abdomen pelvis 06/09/2020. At least mild calcified and noncalcified atherosclerotic plaque of the thoracic aorta. Four-vessel coronary artery calcifications. Mediastinum/Nodes: No enlarged mediastinal, hilar, or axillary lymph nodes. Thyroid gland, trachea, and esophagus demonstrate no significant findings. Lungs/Pleura: Bilateral lower lobe subsegmental atelectasis. No focal consolidation. Few scattered pulmonary micronodules. No pulmonary mass. No pleural effusion. No pneumothorax. Upper Abdomen: Colitis and partially visualized right renal cystic lesion. Please see separately dictated CT abdomen pelvis 06/05/2020. Musculoskeletal: No chest wall abnormality. No suspicious lytic or blastic osseous lesions. No acute displaced fracture. Multilevel mild degenerative changes of the spine. Review of the MIP images confirms the above findings. IMPRESSION: 1. Aneurysmal ascending aorta (4.2 cm). Recommend semi-annual imaging followup by CTA or MRA and referral to cardiothoracic surgery if not already obtained. This recommendation follows 2010 ACCF/AHA/AATS/ACR/ASA/SCA/SCAI/SIR/STS/SVM Guidelines for the Diagnosis and Management of Patients With Thoracic Aortic Disease. Circulation. 2010; 121: Q734-L93. Aortic aneurysm NOS (ICD10-I71.9). 2. Aortic Atherosclerosis (ICD10-I70.0) - including four-vessel coronary artery calcifications peer. 3. No pulmonary embolus. 4. Few scattered pulmonary micronodules. No follow-up needed if patient is low-risk (and has  no known or suspected primary neoplasm). Non-contrast chest CT can be considered in 12 months if patient is high-risk. This recommendation follows the consensus statement: Guidelines for Management of Incidental Pulmonary Nodules Detected on CT Images: From the Fleischner Society 2017; Radiology 2017; 284:228-243. 5. Colitis and partially visualized right renal cystic lesion. Please see separately dictated CT abdomen pelvis 05/29/2020. Electronically Signed   By: Tish Frederickson M.D.   On: 05/27/2020 23:38   CT Abdomen Pelvis W Contrast  Result Date: 05/19/2020 CLINICAL DATA:  I diarrhea for 2 more weeks EXAM: CT ABDOMEN AND PELVIS WITH CONTRAST TECHNIQUE: Multidetector CT imaging of the abdomen and pelvis was performed using the standard protocol following bolus administration of intravenous contrast. CONTRAST:  90mL OMNIPAQUE IOHEXOL 300 MG/ML  SOLN COMPARISON:  11/30/2014 FINDINGS: Lower chest: No acute abnormality. Hepatobiliary: Liver is within normal limits. Gallbladder is well distended with multiple dependent gallstones. No wall thickening or pericholecystic fluid is seen. Pancreas: Unremarkable. No pancreatic ductal dilatation or surrounding inflammatory changes. Spleen: Normal in size without focal abnormality. Adrenals/Urinary Tract: Adrenal glands are within normal limits. Left kidney demonstrates no renal calculi or obstructive changes. Similar changes are noted on the right. Stable simple renal cysts are seen the largest of which lies in the upper pole of the right kidney measuring 7.9 cm. Normal excretion of contrast is noted bilaterally. Ureters are within normal limits. The bladder is decompressed. Stomach/Bowel: Colon is predominately decompressed with mild fluid within as well as some mild wall thickening and pericolonic inflammatory change consistent with a diffuse colitis. No abscess or perforation is noted. Small bowel and stomach are within normal limits. The appendix is unremarkable.  Vascular/Lymphatic: Aortic atherosclerosis. No enlarged abdominal or pelvic lymph nodes. Reproductive: Prostate is enlarged but stable Other: Mild reactive fluid is noted related to the underlying colitis.  Musculoskeletal: Degenerative changes of lumbar spine are noted. 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 Stable trabecular coarsening is noted in L3 similar to that seen on the prior exam. 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 IMPRESSION: Findings of colitis throughout the entire colon without evidence of perforation or abscess formation. This would correspond with the patient's given clinical history. Multiple gallstones within well distended gallbladder. No inflammatory changes are seen. Chronic simple renal cysts bilaterally. 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 Electronically Signed   By: Alcide Clever M.D.   On: 05/19/2020 18:45    Scheduled Meds: . aspirin EC  81 mg Oral Daily  . atorvastatin  40 mg Oral Daily  . enoxaparin (LOVENOX) injection  40 mg Subcutaneous Q24H  . mouth rinse  15 mL Mouth Rinse BID  . ticagrelor  90 mg Oral BID  . vancomycin  125 mg Oral QID   Continuous Infusions: . lactated ringers       LOS: 0 days   Rickey Barbara, MD [facility] Pager On Amion  If 7PM-7AM, please contact night-coverage 05/21/2020, 6:24 PM

## 2020-05-21 NOTE — Progress Notes (Signed)
Lactic acid 2.6. Normal saline bolus was infusing when blood was collected. LR bolus ordered for lactic acid of 3.0,  started after blood was drawn, and still infusing. Pt asymptomatic. Opyd, MD notified.

## 2020-05-22 ENCOUNTER — Other Ambulatory Visit (HOSPITAL_COMMUNITY): Payer: Self-pay

## 2020-05-22 DIAGNOSIS — A0472 Enterocolitis due to Clostridium difficile, not specified as recurrent: Secondary | ICD-10-CM | POA: Diagnosis not present

## 2020-05-22 DIAGNOSIS — K529 Noninfective gastroenteritis and colitis, unspecified: Secondary | ICD-10-CM | POA: Diagnosis not present

## 2020-05-22 DIAGNOSIS — J9601 Acute respiratory failure with hypoxia: Secondary | ICD-10-CM | POA: Diagnosis not present

## 2020-05-22 LAB — CBC
HCT: 41.7 % (ref 39.0–52.0)
Hemoglobin: 14.5 g/dL (ref 13.0–17.0)
MCH: 33.4 pg (ref 26.0–34.0)
MCHC: 34.8 g/dL (ref 30.0–36.0)
MCV: 96.1 fL (ref 80.0–100.0)
Platelets: 208 10*3/uL (ref 150–400)
RBC: 4.34 MIL/uL (ref 4.22–5.81)
RDW: 14 % (ref 11.5–15.5)
WBC: 19.4 10*3/uL — ABNORMAL HIGH (ref 4.0–10.5)
nRBC: 0 % (ref 0.0–0.2)

## 2020-05-22 LAB — COMPREHENSIVE METABOLIC PANEL
ALT: 17 U/L (ref 0–44)
AST: 32 U/L (ref 15–41)
Albumin: 2.1 g/dL — ABNORMAL LOW (ref 3.5–5.0)
Alkaline Phosphatase: 104 U/L (ref 38–126)
Anion gap: 7 (ref 5–15)
BUN: 18 mg/dL (ref 8–23)
CO2: 23 mmol/L (ref 22–32)
Calcium: 8.9 mg/dL (ref 8.9–10.3)
Chloride: 102 mmol/L (ref 98–111)
Creatinine, Ser: 0.89 mg/dL (ref 0.61–1.24)
GFR, Estimated: >60 mL/min (ref >60)
Glucose, Bld: 83 mg/dL (ref 70–99)
Potassium: 3.9 mmol/L (ref 3.5–5.1)
Sodium: 132 mmol/L — ABNORMAL LOW (ref 135–145)
Total Bilirubin: 2.8 mg/dL — ABNORMAL HIGH (ref 0.3–1.2)
Total Protein: 5.2 g/dL — ABNORMAL LOW (ref 6.5–8.1)

## 2020-05-22 LAB — PROCALCITONIN: Procalcitonin: 0.41 ng/mL

## 2020-05-22 LAB — MAGNESIUM: Magnesium: 2.2 mg/dL (ref 1.7–2.4)

## 2020-05-22 MED ORDER — CYANOCOBALAMIN 1000 MCG/ML IJ SOLN
1000.0000 ug | Freq: Once | INTRAMUSCULAR | Status: AC
  Administered 2020-05-22: 1000 ug via INTRAMUSCULAR
  Filled 2020-05-22 (×2): qty 1

## 2020-05-22 MED ORDER — ZINC OXIDE 12.8 % EX OINT
TOPICAL_OINTMENT | CUTANEOUS | Status: DC | PRN
  Filled 2020-05-22 (×2): qty 56.7

## 2020-05-22 MED ORDER — LACTATED RINGERS IV BOLUS
1000.0000 mL | Freq: Once | INTRAVENOUS | Status: AC
  Administered 2020-05-22: 1000 mL via INTRAVENOUS

## 2020-05-22 NOTE — Consult Note (Signed)
WOC Nurse Consult Note: Patient receiving care in [facility] 2W20 Reason for Consult: MASD buttocks Wound type: MASD/IAD to buttocks Pressure Injury POA: NA Measurement:  Wound bed: Red pink painful r/t multiple episodes of diarrhea Drainage (amount, consistency, odor) None Periwound: Red Dressing procedure/placement/frequency: Gently cleanse the sacral area with soap and water, rinse, pat dry. Apply triple paste to buttocks PRN soiling.  Monitor the wound area(s) for worsening of condition such as: Signs/symptoms of infection, increase in size, development of or worsening of odor, development of pain, or increased pain at the affected locations.   Notify the medical team if any of these develop.  Thank you for the consult. WOC nurse will not follow at this time.   Please re-consult the WOC team if needed.  Renaldo Reel Katrinka Blazing, MSN, RN, CMSRN, Angus Seller, [facility] Wound Treatment Associate Pager [phone]

## 2020-05-22 NOTE — Progress Notes (Signed)
Pt has multiple bowel movements throughout the night. MASD on bilateral buttock, worsening. Pt c/o burning at his buttock region. Opyd, MD paged and order for flexiseal received.  Pt refused flexiseal to be placed, stating he does not want anything to be placed in his rectum, and he will deal with frequent BP and pain to his bottom.

## 2020-05-22 NOTE — Progress Notes (Signed)
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 Pt was found to have significant colitis on CT abd with stools being pos for Cdiff at time of presentation  Assessment & Plan:   Principal Problem:   Acute colitis Active Problems:   Sepsis (HCC)   History of embolic stroke   Skin ulcer of buttock, limited to breakdown of skin (HCC)   Hyponatremia   Acute metabolic encephalopathy   Acute respiratory failure with hypoxia (HCC)   Hyperglycemia   Clostridium difficile colitis  Principal Problem:   Acute Cdiff colitis with severe sepsis all present on admission    Patient presenting with several week history of severe diarrhea with CT evidence of diffuse colitis  Multiple SIRS criteria including tachycardia and leukocytosis in the setting of organ dysfunction with encephalopathy concerning for developing sepsis. Lactate peaked to 3.0  Continued on PO vanc  Fewer episodes of diarrhea noted  Remains very dehydrated with dry mucus membranes, will continue aggressive IVF hydration per below  Recheck bmet in AM  Active Problems:   Acute metabolic encephalopathy   Likely secondary to underlying dehydration  Hydrating patient aggressively with intravenous isotonic fluids  We will additionally initiate antibiotic therapy as appropriate  TSH and folate are within normal limits  B12 low at 86. Will give dose of vit b12  Mentation seems stable this AM, answering questions appropriately    Acute respiratory failure with hypoxia (HCC)   CT chest reviewed, neg for PE  Incidental findings of 4.2cm ascending aortic aneurysm with  recommendation for semi-annual imaging follow up by CTA or MRA    History of embolic stroke   Continuing home regimen of aspirin and Brilinta for concurrent right carotid stenosis status post stenting 02/2020    Skin ulcer of buttock, limited to breakdown of skin (HCC)   Notable irregular ulcerations over the anus in a circumferential fashion secondary to frequent stooling over the past several weeks  Asking nursing to apply a barrier cream twice daily  WOC was consulted    Hyponatremia   Clinically dehydrated  Hydrating patient with aggressive intravenous isotonic fluid  Recheck bmet in AM    Hyperglycemia   Hemoglobin A1c of 5.8  Vitamin b12 deficiency -vit B12 level noted to be 86 -Have ordered one dose of subq vit b12   DVT prophylaxis: Lovenox subq Code Status: Full Family Communication: Pt in room, family is currently at bedside  Status is: Inpatient  Remains inpatient appropriate because:Inpatient level of care appropriate due to severity of illness   Dispo: The patient is from: Home              Anticipated d/c is to: Home              Patient currently is not medically stable to d/c.   Difficult to place patient No    Consultants:     Procedures:     Antimicrobials: Anti-infectives (From admission, onward)   Start     Dose/Rate Route Frequency Ordered Stop   05/21/20 1000  ceFEPIme (MAXIPIME) 2 g in sodium chloride 0.9 % 100 mL IVPB  Status:  Discontinued        2 g  200 mL/hr over 30 Minutes Intravenous Every 12 hours 06/04/2020 2150 06/10/2020 2202   05/21/2020 2215  vancomycin (VANCOCIN) 50 mg/mL oral solution 125 mg        125 mg Oral 4 times daily 05/18/2020 2202 05/30/20 2159   06/12/2020 2145  ceFEPIme (MAXIPIME) 2 g in sodium chloride 0.9 % 100 mL IVPB  Status:  Discontinued        2 g 200 mL/hr over 30 Minutes Intravenous  Once 05/21/2020 2142 05/16/2020 2202   05/17/2020 2145  metroNIDAZOLE (FLAGYL) IVPB 500 mg  Status:  Discontinued         500 mg 100 mL/hr over 60 Minutes Intravenous Every 8 hours 05/26/2020 2142 06/09/2020 2202      Subjective: Still weak, more alert today. Feels thirsty  Objective: Vitals:   05/21/20 1500 05/21/20 1600 05/21/20 2028 05/22/20 1555  BP:  104/65 117/74 116/69  Pulse:  85 93 95  Resp:  Temp:  97.8 F (36.6 C) 98.8 F (37.1 C) 97.6 F (36.4 C)  TempSrc:  Oral Oral Oral  SpO2: (P) 95% 95% 96% 96%  Weight:      Height:       No intake or output data in the 24 hours ending 05/22/20 1605 Filed Weights   05/27/2020 1353  Weight: 77.1 kg    Examination: General exam: Conversant, in no acute distress, mucus membranes dry Respiratory system: normal chest rise, clear, no audible wheezing Cardiovascular system: regular rhythm, s1-s2 Gastrointestinal system: Nondistended, nontender, pos BS Central nervous system: No seizures, no tremors Extremities: No cyanosis, no joint deformities Skin: No rashes, no pallor Psychiatry: Affect normal // no auditory hallucinations   Data Reviewed: I have personally reviewed following labs and imaging studies  CBC: Recent Labs  Lab 06/03/2020 1429 05/29/2020 2141 05/21/20 0017 05/22/20 0849  WBC 34.0*  --  26.9* 19.4*  NEUTROABS 32.3*  --  24.1*  --   HGB 16.8 13.6 13.9 14.5  HCT 49.1 40.0 41.2 41.7  MCV 96.8  --  100.0 96.1  PLT 295  --  211 208   Basic Metabolic Panel: Recent Labs  Lab 05/14/2020 1429 05/30/2020 2141 05/21/20 0017 05/22/20 0849  NA 131* 133* 133* 132*  K 4.2 2.9* 3.6 3.9  CL 101  --  104 102  CO2 19*  --  23 23  GLUCOSE 148*  --  97 83  BUN 17  --  17 18  CREATININE 1.16  --  0.98 0.89  CALCIUM 9.9  --  8.6* 8.9  MG  --   --  1.6* 2.2   GFR: Estimated Creatinine Clearance: 64 mL/min (by C-G formula based on SCr of 0.89 mg/dL). Liver Function Tests: Recent Labs  Lab 05/29/2020 1429 05/21/20 0017 05/22/20 0849  AST 30 19 32  ALT ALKPHOS 129* 86 104  BILITOT 3.7* 2.4* 2.8*  PROT 6.1* 4.3* 5.2*   ALBUMIN 2.5* 1.9* 2.1*   No results for input(s): LIPASE, AMYLASE in the last 168 hours. No results for input(s): AMMONIA in the last 168 hours. Coagulation Profile: Recent Labs  Lab 05/21/20 0017  INR 1.2   Cardiac Enzymes: No results for input(s): CKTOTAL, CKMB, CKMBINDEX, TROPONINI in the last 168 hours. BNP (last 3 results) No results for input(s): PROBNP in the last 8760 hours. HbA1C: Recent Labs    05/21/20 0017  HGBA1C 5.8*   CBG: No results for input(s): GLUCAP in the last 168 hours. Lipid  Profile: No results for input(s): CHOL, HDL, LDLCALC, TRIG, CHOLHDL, LDLDIRECT in the last 72 hours. Thyroid Function Tests: Recent Labs    05/18/2020 2138  TSH 0.995   Anemia Panel: Recent Labs    06/09/2020 2138  VITAMINB12 86*  FOLATE 8.5   Sepsis Labs: Recent Labs  Lab 05/17/2020 2143 05/21/20 0017 05/22/20 0849  PROCALCITON  --   --  0.41  LATICACIDVEN 3.0* 2.6*  --     Recent Results (from the past 240 hour(s))  Resp Panel by RT-PCR (Flu A&B, Covid) Nasopharyngeal Swab     Status: None   Collection Time: 05/26/2020  2:01 PM   Specimen: Nasopharyngeal Swab; Nasopharyngeal(NP) swabs in vial transport medium  Result Value Ref Range Status   SARS Coronavirus 2 by RT PCR NEGATIVE NEGATIVE Final    Comment: (NOTE) SARS-CoV-2 target nucleic acids are NOT DETECTED.  The SARS-CoV-2 RNA is generally detectable in upper respiratory specimens during the acute phase of infection. The lowest concentration of SARS-CoV-2 viral copies this assay can detect is 138 copies/mL. A negative result does not preclude SARS-Cov-2 infection and should not be used as the sole basis for treatment or other patient management decisions. A negative result may occur with  improper specimen collection/handling, submission of specimen other than nasopharyngeal swab, presence of viral mutation(s) within the areas targeted by this assay, and inadequate number of viral copies(<138 copies/mL). A  negative result must be combined with clinical observations, patient history, and epidemiological information. The expected result is Negative.  Fact Sheet for Patients:  BloggerCourse.com  Fact Sheet for Healthcare Providers:  SeriousBroker.it  This test is no t yet approved or cleared by the [place] FDA and  has been authorized for detection and/or diagnosis of SARS-CoV-2 by FDA under an Emergency Use Authorization (EUA). This EUA will remain  in effect (meaning this test can be used) for the duration of the COVID-19 declaration under Section 564(b)(1) of the Act, 21 U.S.C.section 360bbb-3(b)(1), unless the authorization is terminated  or revoked sooner.       Influenza A by PCR NEGATIVE NEGATIVE Final   Influenza B by PCR NEGATIVE NEGATIVE Final    Comment: (NOTE) The Xpert Xpress SARS-CoV-2/FLU/RSV plus assay is intended as an aid in the diagnosis of influenza from Nasopharyngeal swab specimens and should not be used as a sole basis for treatment. Nasal washings and aspirates are unacceptable for Xpert Xpress SARS-CoV-2/FLU/RSV testing.  Fact Sheet for Patients: BloggerCourse.com  Fact Sheet for Healthcare Providers: SeriousBroker.it  This test is not yet approved or cleared by the [place] FDA and has been authorized for detection and/or diagnosis of SARS-CoV-2 by FDA under an Emergency Use Authorization (EUA). This EUA will remain in effect (meaning this test can be used) for the duration of the COVID-19 declaration under Section 564(b)(1) of the Act, 21 U.S.C. section 360bbb-3(b)(1), unless the authorization is terminated or revoked.  Performed at [facility], [address], [place], [place] [address]   Gastrointestinal Panel by PCR , Stool     Status: None   Collection Time: 06/08/2020  7:33 PM   Specimen: STOOL  Result Value Ref Range Status    Campylobacter species NOT DETECTED NOT DETECTED Final   Plesimonas shigelloides NOT DETECTED NOT DETECTED Final   Salmonella species NOT DETECTED NOT DETECTED Final   Yersinia enterocolitica NOT DETECTED NOT DETECTED Final   Vibrio species NOT DETECTED NOT DETECTED Final   Vibrio cholerae NOT DETECTED NOT DETECTED Final  Enteroaggregative E coli (EAEC) NOT DETECTED NOT DETECTED Final   Enteropathogenic E coli (EPEC) NOT DETECTED NOT DETECTED Final   Enterotoxigenic E coli (ETEC) NOT DETECTED NOT DETECTED Final   Shiga like toxin producing E coli (STEC) NOT DETECTED NOT DETECTED Final   Shigella/Enteroinvasive E coli (EIEC) NOT DETECTED NOT DETECTED Final   Cryptosporidium NOT DETECTED NOT DETECTED Final   Cyclospora cayetanensis NOT DETECTED NOT DETECTED Final   Entamoeba histolytica NOT DETECTED NOT DETECTED Final   Giardia lamblia NOT DETECTED NOT DETECTED Final   Adenovirus F40/41 NOT DETECTED NOT DETECTED Final   Astrovirus NOT DETECTED NOT DETECTED Final   Norovirus GI/GII NOT DETECTED NOT DETECTED Final   Rotavirus A NOT DETECTED NOT DETECTED Final   Sapovirus (I, II, IV, and V) NOT DETECTED NOT DETECTED Final    Comment: Performed at [facility], [address] [place], [place] [address]  C Difficile Quick Screen w PCR reflex     Status: Abnormal   Collection Time: May 21, 2020  7:39 PM   Specimen: STOOL  Result Value Ref Range Status   C Diff antigen POSITIVE (A) NEGATIVE Final   C Diff toxin NEGATIVE NEGATIVE Final   C Diff interpretation Results are indeterminate. See PCR results.  Final    Comment: Performed at [facility], [address] [place], [place] [address]  C. Diff by PCR, Reflexed     Status: Abnormal   Collection Time: 05-21-20  7:39 PM  Result Value Ref Range Status   Toxigenic C. Difficile by PCR POSITIVE (A) NEGATIVE Final    Comment: Positive for toxigenic C. difficile with little to no toxin production. Only treat if clinical presentation  suggests symptomatic illness. Performed at [facility], [address] [place], [place] [address]   Culture, blood (routine x 2)     Status: None (Preliminary result)   Collection Time: 2020/05/21  9:43 PM   Specimen: BLOOD LEFT ARM  Result Value Ref Range Status   Specimen Description BLOOD LEFT ARM  Final   Special Requests   Final    BOTTLES DRAWN AEROBIC AND ANAEROBIC Blood Culture results may not be optimal due to an inadequate volume of blood received in culture bottles   Culture   Final    NO GROWTH 2 DAYS Performed at [facility], [address], [place], [place] [address]    Report Status PENDING  Incomplete  Culture, blood (routine x 2)     Status: None (Preliminary result)   Collection Time: 21-May-2020  9:45 PM   Specimen: BLOOD  Result Value Ref Range Status   Specimen Description BLOOD SITE NOT SPECIFIED  Final   Special Requests   Final    BOTTLES DRAWN AEROBIC AND ANAEROBIC Blood Culture results may not be optimal due to an inadequate volume of blood received in culture bottles   Culture   Final    NO GROWTH 2 DAYS Performed at [facility], [address], [place], [place] [address]    Report Status PENDING  Incomplete  Culture, Urine     Status: Abnormal (Preliminary result)   Collection Time: 05/21/20 12:36 AM   Specimen: Urine, Clean Catch  Result Value Ref Range Status   Specimen Description URINE, CLEAN CATCH  Final   Special Requests NONE  Final   Culture (A)  Final    >=100,000 COLONIES/mL ESCHERICHIA COLI SUSCEPTIBILITIES TO FOLLOW Performed at [facility], [address],  [place], [place] [address]    Report Status PENDING  Incomplete     Radiology Studies: DG Chest 1 View  Result Date: 06/09/2020 CLINICAL DATA:  80 year old male with shortness of breath. EXAM: CHEST  1 VIEW COMPARISON:  None. FINDINGS: The lungs are clear. There is no pleural effusion pneumothorax. The cardiac silhouette is within limits. No acute osseous  pathology. Atherosclerotic calcification of the aorta. IMPRESSION: No active disease. Electronically Signed   By: Elgie Collard M.D.   On: 06/09/20 21:31   CT ANGIO CHEST PE W OR WO CONTRAST  Result Date: 06-09-2020 CLINICAL DATA:  Acute respiratory failure with hypoxia. Recent colitis. Clostridium difficile. Possibly sepsis. EXAM: CT ANGIOGRAPHY CHEST WITH CONTRAST TECHNIQUE: Multidetector CT imaging of the chest was performed using the standard protocol during bolus administration of intravenous contrast. Multiplanar CT image reconstructions and MIPs were obtained to evaluate the vascular anatomy. CONTRAST:  96mL OMNIPAQUE IOHEXOL 350 MG/ML SOLN COMPARISON:  CT abdomen pelvis 06/09/20 FINDINGS: Cardiovascular: Satisfactory opacification of the pulmonary arteries to the segmental level. No evidence of pulmonary embolism. Normal heart size. No significant pericardial effusion. The ascending aorta measures up to 4.2 cm on axial imaging. The descending thoracic aorta is normal in caliber. No definite findings of aortic dissection; however, limited evaluation due to preferential opacification of the pulmonary arterial system. No findings of dissection of the partially visualized and opacified thoracic aorta on the CT abdomen pelvis Jun 09, 2020. At least mild calcified and noncalcified atherosclerotic plaque of the thoracic aorta. Four-vessel coronary artery calcifications. Mediastinum/Nodes: No enlarged mediastinal, hilar, or axillary lymph nodes. Thyroid gland, trachea, and esophagus demonstrate no significant findings. Lungs/Pleura: Bilateral lower lobe subsegmental atelectasis. No focal consolidation. Few scattered pulmonary micronodules. No pulmonary mass. No pleural effusion. No pneumothorax. Upper Abdomen: Colitis and partially visualized right renal cystic lesion. Please see separately dictated CT abdomen pelvis 2020/06/09. Musculoskeletal: No chest wall abnormality. No suspicious lytic or blastic  osseous lesions. No acute displaced fracture. Multilevel mild degenerative changes of the spine. Review of the MIP images confirms the above findings. IMPRESSION: 1. Aneurysmal ascending aorta (4.2 cm). Recommend semi-annual imaging followup by CTA or MRA and referral to cardiothoracic surgery if not already obtained. This recommendation follows 2010 ACCF/AHA/AATS/ACR/ASA/SCA/SCAI/SIR/STS/SVM Guidelines for the Diagnosis and Management of Patients With Thoracic Aortic Disease. Circulation. 2010; 121: J179-X50. Aortic aneurysm NOS (ICD10-I71.9). 2. Aortic Atherosclerosis (ICD10-I70.0) - including four-vessel coronary artery calcifications peer. 3. No pulmonary embolus. 4. Few scattered pulmonary micronodules. No follow-up needed if patient is low-risk (and has no known or suspected primary neoplasm). Non-contrast chest CT can be considered in 12 months if patient is high-risk. This recommendation follows the consensus statement: Guidelines for Management of Incidental Pulmonary Nodules Detected on CT Images: From the Fleischner Society 2017; Radiology 2017; 284:228-243. 5. Colitis and partially visualized right renal cystic lesion. Please see separately dictated CT abdomen pelvis Jun 09, 2020. Electronically Signed   By: Tish Frederickson M.D.   On: 2020/06/09 23:38   CT Abdomen Pelvis W Contrast  Result Date: 09-Jun-2020 CLINICAL DATA:  I diarrhea for 2 more weeks EXAM: CT ABDOMEN AND PELVIS WITH CONTRAST TECHNIQUE: Multidetector CT imaging of the abdomen and pelvis was performed using the standard protocol following bolus administration of intravenous contrast. CONTRAST:  53mL OMNIPAQUE IOHEXOL 300 MG/ML  SOLN COMPARISON:  11/30/2014 FINDINGS: Lower chest: No acute abnormality. Hepatobiliary: Liver is within normal limits. Gallbladder is well distended with multiple dependent gallstones. No wall thickening or pericholecystic fluid is seen. Pancreas: Unremarkable. No pancreatic ductal dilatation or  surrounding  inflammatory changes. Spleen: Normal in size without focal abnormality. Adrenals/Urinary Tract: Adrenal glands are within normal limits. Left kidney demonstrates no renal calculi or obstructive changes. Similar changes are noted on the right. Stable simple renal cysts are seen the largest of which lies in the upper pole of the right kidney measuring 7.9 cm. Normal excretion of contrast is noted bilaterally. Ureters are within normal limits. The bladder is decompressed. Stomach/Bowel: Colon is predominately decompressed with mild fluid within as well as some mild wall thickening and pericolonic inflammatory change consistent with a diffuse colitis. No abscess or perforation is noted. Small bowel and stomach are within normal limits. The appendix is unremarkable. Vascular/Lymphatic: Aortic atherosclerosis. No enlarged abdominal or pelvic lymph nodes. Reproductive: Prostate is enlarged but stable Other: Mild reactive fluid is noted related to the underlying colitis. Musculoskeletal: Degenerative changes of lumbar spine are noted. 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 Stable trabecular coarsening is noted in L3 similar to that seen on the prior exam. 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 IMPRESSION: Findings of colitis throughout the entire colon without evidence of perforation or abscess formation. This would correspond with the patient's given clinical history. Multiple gallstones within well distended gallbladder. No inflammatory changes are seen. Chronic simple renal cysts bilaterally. 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 Electronically Signed   By: Alcide CleverMark  Lukens M.D.   On: 11-04-2020 18:45    Scheduled Meds: . aspirin EC  81 mg Oral Daily  . atorvastatin  40 mg Oral Daily  . enoxaparin (LOVENOX) injection  40 mg Subcutaneous Q24H  . mouth rinse  15 mL Mouth  Rinse BID  . ticagrelor  90 mg Oral BID  . vancomycin  125 mg Oral QID   Continuous Infusions: . lactated ringers 125 mL/hr at 05/22/20 1104     LOS: 1 day   Rickey BarbaraStephen Rise Traeger, MD [facility] Pager On Amion  If 7PM-7AM, please contact night-coverage 05/22/2020, 4:05 PM

## 2020-05-23 ENCOUNTER — Inpatient Hospital Stay (HOSPITAL_COMMUNITY): Payer: Medicare (Managed Care)

## 2020-05-23 DIAGNOSIS — K529 Noninfective gastroenteritis and colitis, unspecified: Secondary | ICD-10-CM | POA: Diagnosis not present

## 2020-05-23 DIAGNOSIS — A0472 Enterocolitis due to Clostridium difficile, not specified as recurrent: Secondary | ICD-10-CM | POA: Diagnosis not present

## 2020-05-23 DIAGNOSIS — J9601 Acute respiratory failure with hypoxia: Secondary | ICD-10-CM | POA: Diagnosis not present

## 2020-05-23 LAB — COMPREHENSIVE METABOLIC PANEL
ALT: 22 U/L (ref 0–44)
AST: 29 U/L (ref 15–41)
Albumin: 2 g/dL — ABNORMAL LOW (ref 3.5–5.0)
Alkaline Phosphatase: 85 U/L (ref 38–126)
Anion gap: 11 (ref 5–15)
BUN: 15 mg/dL (ref 8–23)
CO2: 23 mmol/L (ref 22–32)
Calcium: 8.7 mg/dL — ABNORMAL LOW (ref 8.9–10.3)
Chloride: 99 mmol/L (ref 98–111)
Creatinine, Ser: 0.91 mg/dL (ref 0.61–1.24)
GFR, Estimated: >60 mL/min (ref >60)
Glucose, Bld: 68 mg/dL — ABNORMAL LOW (ref 70–99)
Potassium: 2.8 mmol/L — ABNORMAL LOW (ref 3.5–5.1)
Sodium: 133 mmol/L — ABNORMAL LOW (ref 135–145)
Total Bilirubin: 2.7 mg/dL — ABNORMAL HIGH (ref 0.3–1.2)
Total Protein: 4.9 g/dL — ABNORMAL LOW (ref 6.5–8.1)

## 2020-05-23 LAB — CBC
HCT: 38.3 % — ABNORMAL LOW (ref 39.0–52.0)
Hemoglobin: 13.1 g/dL (ref 13.0–17.0)
MCH: 33.2 pg (ref 26.0–34.0)
MCHC: 34.2 g/dL (ref 30.0–36.0)
MCV: 97 fL (ref 80.0–100.0)
Platelets: 191 10*3/uL (ref 150–400)
RBC: 3.95 MIL/uL — ABNORMAL LOW (ref 4.22–5.81)
RDW: 14 % (ref 11.5–15.5)
WBC: 16.8 10*3/uL — ABNORMAL HIGH (ref 4.0–10.5)
nRBC: 0 % (ref 0.0–0.2)

## 2020-05-23 LAB — URINE CULTURE: Culture: >=100000 — AB

## 2020-05-23 MED ORDER — POTASSIUM CHLORIDE CRYS ER 20 MEQ PO TBCR
60.0000 meq | EXTENDED_RELEASE_TABLET | Freq: Two times a day (BID) | ORAL | Status: AC
  Administered 2020-05-23 (×2): 60 meq via ORAL
  Filled 2020-05-23 (×2): qty 3

## 2020-05-23 NOTE — Evaluation (Signed)
Occupational Therapy Evaluation Patient Details Name: Michael Wells MRN: [id number] DOB: 1940/03/28 Today's Date: 05/23/2020    History of Present Illness Michael Wells is a 80 year old male admitted 2020/06/11 with complaints of diarrhea and weakness. Pt was found to have significant colitis and Cdiff. 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   Clinical Impression   Pt is typically independent in ADL and mobility. Recently discharged from OPPT. Today presents with generalized weakness, decreased balance, decreased independence in ADL and functional transfers. Today eager to work with therapy and prior to sitting EOB special attention for BP (see below). Max A +2 for bed mobility, when he came EOB fecal pouch came un-done and so he required max A +2 to return supine for cleaning and linen change at total A/mod A +2 for rolling at bed level. Pt will benefit from skilled OT in the acute setting as well as afterwards at the SNF level to maximize safety and independence in ADL and functional transfers. Next session plan for OOB activity.   BP: Supine (flat): 109/70 HR 89 Supine HOB at 30 degrees: 106/65 HR 89 Supine HOB at 45 degrees: 106/67 HR 89 Sitting EOB: 112/75 HR 91    Follow Up Recommendations  SNF    Equipment Recommendations  Other (comment) (defer to next venue)    Recommendations for Other Services       Precautions / Restrictions Precautions Precautions: Fall Restrictions Weight Bearing Restrictions: No      Mobility Bed Mobility Overal bed mobility: Needs Assistance Bed Mobility: Rolling;Supine to Sit;Sit to Supine Rolling: Mod assist;+2 for safety/equipment   Supine to sit: +2 for physical assistance;+2 for safety/equipment;Max assist;HOB elevated Sit to supine: Max assist;+2 for physical assistance;+2 for safety/equipment   General bed mobility comments: vc for  sequencing, assist for BLE, trunk elevation, Pt attempting to assist pushing up from bed rail    Transfers                 General transfer comment: NT this session, deferred    Balance Overall balance assessment: Needs assistance Sitting-balance support: Single extremity supported;Feet supported Sitting balance-Leahy Scale: Poor Sitting balance - Comments: approaching fair                                   ADL either performed or assessed with clinical judgement   ADL Overall ADL's : Needs assistance/impaired Eating/Feeding: Minimal assistance;Bed level (HOB elevated) Eating/Feeding Details (indicate cue type and reason): cues for smaller sips Grooming: Bed level;Minimal assistance (HOB elevated) Grooming Details (indicate cue type and reason): can bring hands to face for grooming tasks Upper Body Bathing: Moderate assistance;Bed level   Lower Body Bathing: Total assistance   Upper Body Dressing : Moderate assistance   Lower Body Dressing: Total assistance   Toilet Transfer: Moderate assistance;+2 for physical assistance;+2 for safety/equipment (to come EOB, mod A +2 rolling bed level)   Toileting- Clothing Manipulation and Hygiene: Total assistance;Bed level       Functional mobility during ADLs:  (deferred) General ADL Comments: generalized weakness     Vision Baseline Vision/History: Wears glasses Wears Glasses: At all times Patient Visual Report: No change from baseline       Perception     Praxis      Pertinent Vitals/Pain Pain Assessment: Faces Faces Pain Scale: Hurts little more Pain Location: burning in bottom  with rolling Pain Descriptors / Indicators: Burning Pain Intervention(s): Limited activity within patient's tolerance;Monitored during session;Repositioned     Hand Dominance Right   Extremity/Trunk Assessment Upper Extremity Assessment Upper Extremity Assessment: Generalized weakness   Lower Extremity  Assessment Lower Extremity Assessment: Defer to PT evaluation   Cervical / Trunk Assessment Cervical / Trunk Assessment: Normal   Communication Communication Communication: No difficulties   Cognition Arousal/Alertness: Lethargic;Awake/alert (initially lethargic, improved throughout session) Behavior During Therapy: WFL for tasks assessed/performed;Flat affect Overall Cognitive Status: Within Functional Limits for tasks assessed                                 General Comments: continue to monitor   General Comments  wife present at beginning and end of session, very supportive    Exercises     Shoulder Instructions      Home Living Family/patient expects to be discharged to:: Private residence Living Arrangements: Spouse/significant other Available Help at Discharge: Available 24 hours/day;Family Type of Home: House Home Access: Stairs to enter [employer] of Steps: 2 Entrance Stairs-Rails: Right;Left Home Layout: One level     Bathroom Shower/Tub: Tub/shower unit;Door   Bathroom Toilet: Handicapped height Bathroom Accessibility: Yes   Home Equipment: Walker - 2 wheels          Prior Functioning/Environment Level of Independence: Independent        Comments: as of Feb was driving and managing own meds/finances        OT Problem List: Decreased strength;Decreased range of motion;Impaired balance (sitting and/or standing);Decreased activity tolerance;Decreased safety awareness;Decreased knowledge of use of DME or AE      OT Treatment/Interventions: Self-care/ADL training;Therapeutic exercise;DME and/or AE instruction;Manual therapy;Therapeutic activities;Patient/family education;Balance training    OT Goals(Current goals can be found in the care plan section) Acute Rehab OT Goals Patient Stated Goal: to get stronger again OT Goal Formulation: With patient Time For Goal Achievement: 06/06/20 Potential to Achieve Goals: Good ADL  Goals Pt Will Perform Grooming: with set-up;sitting Pt Will Perform Upper Body Dressing: with set-up;sitting Pt Will Perform Lower Body Dressing: with mod assist;sit to/from stand Pt Will Transfer to Toilet: with mod assist;bedside commode;stand pivot transfer Pt Will Perform Toileting - Clothing Manipulation and hygiene: with mod assist;sit to/from stand Additional ADL Goal #1: Pt will perform bed mobility prior to engaging in ADL at min A  OT Frequency: Min 2X/week   Barriers to D/C:            Co-evaluation PT/OT/SLP Co-Evaluation/Treatment: Yes Reason for Co-Treatment: Complexity of the patient's impairments (multi-system involvement);For patient/therapist safety;To address functional/ADL transfers PT goals addressed during session: Mobility/safety with mobility;Balance;Strengthening/ROM OT goals addressed during session: ADL's and self-care;Strengthening/ROM      AM-PAC OT "6 Clicks" Daily Activity     Outcome Measure Help from another person eating meals?: A Little Help from another person taking care of personal grooming?: A Little Help from another person toileting, which includes using toliet, bedpan, or urinal?: A Lot Help from another person bathing (including washing, rinsing, drying)?: A Lot Help from another person to put on and taking off regular upper body clothing?: A Lot Help from another person to put on and taking off regular lower body clothing?: Total 6 Click Score: 13   End of Session Nurse Communication: Mobility status;Precautions;Other (comment) (condom cath off)  Activity Tolerance: Patient tolerated treatment well Patient left: in bed;with call bell/phone within reach;with bed alarm set;with family/visitor  present;Other (comment) (bed in chair position (slightly [place]))  OT Visit Diagnosis: Unsteadiness on feet (R26.81);Other abnormalities of gait and mobility (R26.89);Repeated falls (R29.6);Muscle weakness (generalized) (M62.81);History of  falling (Z91.81);Adult, failure to thrive (R62.7)                Time: 1105-1150 OT Time Calculation (min): 45 min Charges:  OT General Charges $OT Visit: 1 Visit OT Evaluation $OT Eval Moderate Complexity: 1 Mod OT Treatments $Self Care/Home Management : 8-22 mins  Nyoka Cowden OTR/L Acute Rehabilitation Services Pager: [phone] Office: [phone]  Evern Bio Harjit Leider 05/23/2020, 12:15 PM

## 2020-05-23 NOTE — Progress Notes (Signed)
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 Pt was found to have significant colitis on CT abd with stools being pos for Cdiff at time of presentation  Assessment & Plan:   Principal Problem:   Acute colitis Active Problems:   Sepsis (HCC)   History of embolic stroke   Skin ulcer of buttock, limited to breakdown of skin (HCC)   Hyponatremia   Acute metabolic encephalopathy   Acute respiratory failure with hypoxia (HCC)   Hyperglycemia   Clostridium difficile colitis  Principal Problem:   Acute Cdiff colitis with severe sepsis all present on admission    Patient presenting with several week history of severe diarrhea with CT evidence of diffuse colitis  Multiple SIRS criteria including tachycardia and leukocytosis in the setting of organ dysfunction with encephalopathy concerning for developing sepsis. Lactate peaked to 3.0  Continued on PO vanc  Frequency of diarrhea has improved  Still remains very dehydrated with dry mucus membranes, albeit improved following aggressive IVF. Would continue IVF for now  Recheck bmet in AM  Active Problems:   Acute metabolic encephalopathy   Likely secondary to underlying dehydration  TSH and folate are within normal limits  B12 low at 86. Given dose of vit b12 on 5/9  Mentation seems improved this AM, answering questions appropriately    Acute respiratory failure with hypoxia (HCC)   CT chest reviewed, neg for PE  Incidental findings of 4.2cm ascending aortic aneurysm with recommendation for semi-annual imaging follow up by CTA or MRA    History of embolic  stroke   Continuing home regimen of aspirin and Brilinta for concurrent right carotid stenosis status post stenting 02/2020    Skin ulcer of buttock, limited to breakdown of skin (HCC)   Notable irregular ulcerations over the anus in a circumferential fashion secondary to frequent stooling over the past several weeks  Asking nursing to apply a barrier cream twice daily  WOC was consulted    Hyponatremia   Clinically dehydrated  Sodium improved with IVF hydration  Recheck bmet in AM    Hyperglycemia   Hemoglobin A1c of 5.8  Vitamin b12 deficiency -vit B12 level noted to be 86 -s/p one dose of subq vit b12 on 5/9   DVT prophylaxis: Lovenox subq Code Status: Full Family Communication: Pt in room, family is currently at bedside  Status is: Inpatient  Remains inpatient appropriate because:Inpatient level of care appropriate due to severity of illness   Dispo: The patient is from: Home              Anticipated d/c is to: Home              Patient currently is not medically stable to d/c.   Difficult to place patient No    Consultants:     Procedures:     Antimicrobials: Anti-infectives (From admission, onward)   Start     Dose/Rate Route Frequency Ordered Stop   05/21/20 1000  ceFEPIme (MAXIPIME) 2 g in sodium chloride 0.9 % 100 mL IVPB  Status:  Discontinued        2 g 200 mL/hr over 30 Minutes Intravenous Every 12 hours May 21, 2020 2150 05-21-2020 2202  06-14-2020 2215  vancomycin (VANCOCIN) 50 mg/mL oral solution 125 mg        125 mg Oral 4 times daily 06/14/2020 2202 05/30/20 2159   06-14-2020 2145  ceFEPIme (MAXIPIME) 2 g in sodium chloride 0.9 % 100 mL IVPB  Status:  Discontinued        2 g 200 mL/hr over 30 Minutes Intravenous  Once 14-Jun-2020 2142 06/14/2020 2202   2020/06/14 2145  metroNIDAZOLE (FLAGYL) IVPB 500 mg  Status:  Discontinued        500 mg 100 mL/hr over 60 Minutes Intravenous Every 8 hours 06-14-2020 2142 2020/06/14 2202       Subjective: More alert today, feeling weak  Objective: Vitals:   05/22/20 1555 05/22/20 2004 05/23/20 0515 05/23/20 0700  BP: 116/69 133/76 103/71 (!) 106/57  Pulse: 95 (!) 105 90 81  Resp: 20  18 19   Temp: 97.6 F (36.4 C) 98.2 F (36.8 C) 97.9 F (36.6 C) 98.1 F (36.7 C)  TempSrc: Oral Axillary Oral Oral  SpO2: 96% 97% 97% 100%  Weight:      Height:        Intake/Output Summary (Last 24 hours) at 05/23/2020 1459 Last data filed at 05/23/2020 1020 Gross per 24 hour  Intake 240 ml  Output --  Net 240 ml   Filed Weights   06-14-2020 1353  Weight: 77.1 kg    Examination: General exam: Awake, laying in bed, in nad, mucus membranes dry Respiratory system: Normal respiratory effort, no wheezing Cardiovascular system: regular rate, s1, s2 Gastrointestinal system: Soft, nondistended, positive BS Central nervous system: CN2-12 grossly intact, strength intact Extremities: Perfused, no clubbing Skin: Normal skin turgor, no notable skin lesions seen Psychiatry: Mood normal // no visual hallucinations   Data Reviewed: I have personally reviewed following labs and imaging studies  CBC: Recent Labs  Lab 2020/06/14 1429 06/14/2020 2141 05/21/20 0017 05/22/20 0849 05/23/20 0219  WBC 34.0*  --  26.9* 19.4* 16.8*  NEUTROABS 32.3*  --  24.1*  --   --   HGB 16.8 13.6 13.9 14.5 13.1  HCT 49.1 40.0 41.2 41.7 38.3*  MCV 96.8  --  100.0 96.1 97.0  PLT 295  --  211 208 191   Basic Metabolic Panel: Recent Labs  Lab June 14, 2020 1429 2020/06/14 2141 05/21/20 0017 05/22/20 0849 05/23/20 0219  NA 131* 133* 133* 132* 133*  K 4.2 2.9* 3.6 3.9 2.8*  CL 101  --  104 102 99  CO2 19*  --  23 23 23   GLUCOSE 148*  --  97 83 68*  BUN 17  --  17 18 15   CREATININE 1.16  --  0.98 0.89 0.91  CALCIUM 9.9  --  8.6* 8.9 8.7*  MG  --   --  1.6* 2.2  --    GFR: Estimated Creatinine Clearance: 62.5 mL/min (by C-G formula based on SCr of 0.91 mg/dL). Liver Function Tests: Recent Labs  Lab  06-14-20 1429 05/21/20 0017 05/22/20 0849 05/23/20 0219  AST 30 19 32 29  ALT 20 17 17 22   ALKPHOS 129* 86 104 85  BILITOT 3.7* 2.4* 2.8* 2.7*  PROT 6.1* 4.3* 5.2* 4.9*  ALBUMIN 2.5* 1.9* 2.1* 2.0*   No results for input(s): LIPASE, AMYLASE in the last 168 hours. No results for input(s): AMMONIA in the last 168 hours. Coagulation Profile: Recent Labs  Lab 05/21/20 0017  INR 1.2   Cardiac Enzymes: No results for input(s): CKTOTAL, CKMB, CKMBINDEX, TROPONINI in the last  168 hours. BNP (last 3 results) No results for input(s): PROBNP in the last 8760 hours. HbA1C: Recent Labs    05/21/20 0017  HGBA1C 5.8*   CBG: No results for input(s): GLUCAP in the last 168 hours. Lipid Profile: No results for input(s): CHOL, HDL, LDLCALC, TRIG, CHOLHDL, LDLDIRECT in the last 72 hours. Thyroid Function Tests: Recent Labs    06/06/2020 2138  TSH 0.995   Anemia Panel: Recent Labs    06/09/2020 2138  VITAMINB12 86*  FOLATE 8.5   Sepsis Labs: Recent Labs  Lab 06/11/2020 2143 05/21/20 0017 05/22/20 0849  PROCALCITON  --   --  0.41  LATICACIDVEN 3.0* 2.6*  --     Recent Results (from the past 240 hour(s))  Resp Panel by RT-PCR (Flu A&B, Covid) Nasopharyngeal Swab     Status: None   Collection Time: 05/29/2020  2:01 PM   Specimen: Nasopharyngeal Swab; Nasopharyngeal(NP) swabs in vial transport medium  Result Value Ref Range Status   SARS Coronavirus 2 by RT PCR NEGATIVE NEGATIVE Final    Comment: (NOTE) SARS-CoV-2 target nucleic acids are NOT DETECTED.  The SARS-CoV-2 RNA is generally detectable in upper respiratory specimens during the acute phase of infection. The lowest concentration of SARS-CoV-2 viral copies this assay can detect is 138 copies/mL. A negative result does not preclude SARS-Cov-2 infection and should not be used as the sole basis for treatment or other patient management decisions. A negative result may occur with  improper specimen collection/handling,  submission of specimen other than nasopharyngeal swab, presence of viral mutation(s) within the areas targeted by this assay, and inadequate number of viral copies(<138 copies/mL). A negative result must be combined with clinical observations, patient history, and epidemiological information. The expected result is Negative.  Fact Sheet for Patients:  BloggerCourse.comhttps://www.fda.gov/media/152166/download  Fact Sheet for Healthcare Providers:  SeriousBroker.ithttps://www.fda.gov/media/152162/download  This test is no t yet approved or cleared by the [place] FDA and  has been authorized for detection and/or diagnosis of SARS-CoV-2 by FDA under an Emergency Use Authorization (EUA). This EUA will remain  in effect (meaning this test can be used) for the duration of the COVID-19 declaration under Section 564(b)(1) of the Act, 21 U.S.C.section 360bbb-3(b)(1), unless the authorization is terminated  or revoked sooner.       Influenza A by PCR NEGATIVE NEGATIVE Final   Influenza B by PCR NEGATIVE NEGATIVE Final    Comment: (NOTE) The Xpert Xpress SARS-CoV-2/FLU/RSV plus assay is intended as an aid in the diagnosis of influenza from Nasopharyngeal swab specimens and should not be used as a sole basis for treatment. Nasal washings and aspirates are unacceptable for Xpert Xpress SARS-CoV-2/FLU/RSV testing.  Fact Sheet for Patients: BloggerCourse.comhttps://www.fda.gov/media/152166/download  Fact Sheet for Healthcare Providers: SeriousBroker.ithttps://www.fda.gov/media/152162/download  This test is not yet approved or cleared by the [place] FDA and has been authorized for detection and/or diagnosis of SARS-CoV-2 by FDA under an Emergency Use Authorization (EUA). This EUA will remain in effect (meaning this test can be used) for the duration of the COVID-19 declaration under Section 564(b)(1) of the Act, 21 U.S.C. section 360bbb-3(b)(1), unless the authorization is terminated or revoked.  Performed at [facility], [address] [place], [place] [address]   Gastrointestinal Panel by PCR , Stool     Status: None   Collection Time: 05/30/2020  7:33 PM   Specimen: STOOL  Result Value Ref Range Status   Campylobacter species NOT DETECTED NOT DETECTED Final   Plesimonas shigelloides NOT DETECTED  NOT DETECTED Final   Salmonella species NOT DETECTED NOT DETECTED Final   Yersinia enterocolitica NOT DETECTED NOT DETECTED Final   Vibrio species NOT DETECTED NOT DETECTED Final   Vibrio cholerae NOT DETECTED NOT DETECTED Final   Enteroaggregative E coli (EAEC) NOT DETECTED NOT DETECTED Final   Enteropathogenic E coli (EPEC) NOT DETECTED NOT DETECTED Final   Enterotoxigenic E coli (ETEC) NOT DETECTED NOT DETECTED Final   Shiga like toxin producing E coli (STEC) NOT DETECTED NOT DETECTED Final   Shigella/Enteroinvasive E coli (EIEC) NOT DETECTED NOT DETECTED Final   Cryptosporidium NOT DETECTED NOT DETECTED Final   Cyclospora cayetanensis NOT DETECTED NOT DETECTED Final   Entamoeba histolytica NOT DETECTED NOT DETECTED Final   Giardia lamblia NOT DETECTED NOT DETECTED Final   Adenovirus F40/41 NOT DETECTED NOT DETECTED Final   Astrovirus NOT DETECTED NOT DETECTED Final   Norovirus GI/GII NOT DETECTED NOT DETECTED Final   Rotavirus A NOT DETECTED NOT DETECTED Final   Sapovirus (I, II, IV, and V) NOT DETECTED NOT DETECTED Final    Comment: Performed at [facility], [address] [place], [place] [address]  C Difficile Quick Screen w PCR reflex     Status: Abnormal   Collection Time: 06/09/2020  7:39 PM   Specimen: STOOL  Result Value Ref Range Status   C Diff antigen POSITIVE (A) NEGATIVE Final   C Diff toxin NEGATIVE NEGATIVE Final   C Diff interpretation Results are indeterminate. See PCR results.  Final    Comment: Performed at [facility], [address] [place], [place] [address]  C. Diff by PCR, Reflexed     Status: Abnormal   Collection Time: 05/28/2020  7:39 PM  Result Value Ref Range  Status   Toxigenic C. Difficile by PCR POSITIVE (A) NEGATIVE Final    Comment: Positive for toxigenic C. difficile with little to no toxin production. Only treat if clinical presentation suggests symptomatic illness. Performed at [facility], [address], [place], [place] [address]   Culture, blood (routine x 2)     Status: None (Preliminary result)   Collection Time: 06/12/2020  9:43 PM   Specimen: BLOOD LEFT ARM  Result Value Ref Range Status   Specimen Description BLOOD LEFT ARM  Final   Special Requests   Final    BOTTLES DRAWN AEROBIC AND ANAEROBIC Blood Culture results may not be optimal due to an inadequate volume of blood received in culture bottles   Culture   Final    NO GROWTH 3 DAYS Performed at [facility], [address], [place], [place] [address]    Report Status PENDING  Incomplete  Culture, blood (routine x 2)     Status: None (Preliminary result)   Collection Time: 05/16/2020  9:45 PM   Specimen: BLOOD  Result Value Ref Range Status   Specimen Description BLOOD SITE NOT SPECIFIED  Final   Special Requests   Final    BOTTLES DRAWN AEROBIC AND ANAEROBIC Blood Culture results may not be optimal due to an inadequate volume of blood received in culture bottles   Culture   Final    NO GROWTH 3 DAYS Performed at [facility], [address], [place], [place] [address]    Report Status PENDING  Incomplete  Culture, Urine     Status: Abnormal   Collection Time: 05/21/20 12:36 AM   Specimen: Urine, Clean Catch  Result Value Ref Range Status   Specimen  Description URINE, CLEAN CATCH  Final   Special Requests   Final    NONE Performed at [facility], [address] [place], [place] [address]    Culture >=100,000 COLONIES/mL ESCHERICHIA COLI (A)  Final   Report Status 05/23/2020 FINAL  Final   Organism ID, Bacteria ESCHERICHIA COLI (A)  Final      Susceptibility   Escherichia coli - MIC*    AMPICILLIN 16 INTERMEDIATE Intermediate      CEFAZOLIN <=4 SENSITIVE Sensitive     CEFEPIME <=0.12 SENSITIVE Sensitive     CEFTRIAXONE <=0.25 SENSITIVE Sensitive     CIPROFLOXACIN <=0.25 SENSITIVE Sensitive     GENTAMICIN <=1 SENSITIVE Sensitive     IMIPENEM <=0.25 SENSITIVE Sensitive     NITROFURANTOIN <=16 SENSITIVE Sensitive     TRIMETH/SULFA <=20 SENSITIVE Sensitive     AMPICILLIN/SULBACTAM 16 INTERMEDIATE Intermediate     PIP/TAZO 16 SENSITIVE Sensitive     * >=100,000 COLONIES/mL ESCHERICHIA COLI     Radiology Studies: No results found.  Scheduled Meds: . aspirin EC  81 mg Oral Daily  . atorvastatin  40 mg Oral Daily  . enoxaparin (LOVENOX) injection  40 mg Subcutaneous Q24H  . mouth rinse  15 mL Mouth Rinse BID  . potassium chloride  60 mEq Oral BID  . ticagrelor  90 mg Oral BID  . vancomycin  125 mg Oral QID   Continuous Infusions: . lactated ringers 125 mL/hr at 05/22/20 1735     LOS: 2 days   Rickey Barbara, MD [facility] Pager On Amion  If 7PM-7AM, please contact night-coverage 05/23/2020, 2:59 PM

## 2020-05-23 NOTE — Plan of Care (Signed)

## 2020-05-23 NOTE — Progress Notes (Signed)
Pt repeatedly pulling off telemetry leads after multiple  Unsuccessful attempts at reorientation.Pt states i'm going home. Pt informed he's in the hospital and is not going home.Pt asks, "well when am I going home? I've been in the hospital forever? Pt also states that his wife is supposed to be coming to get him. Pt informed that his wife just left for the evening and will be back tomorrow. Mittens placed to bilateral hands to keep pt from pulling telemetry leads and pulse ox off. Will continue to monitor pt.

## 2020-05-23 NOTE — Evaluation (Signed)
Physical Therapy Evaluation Patient Details Name: Michael Wells MRN: [id number] DOB: 07-17-1940 Today's Date: 05/23/2020   History of Present Illness  Michael Wells is a 80 year old male admitted 06/13/2020 with complaints of diarrhea and weakness. Pt was found to have significant colitis and Cdiff. 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  Clinical Impression   Pt admitted secondary to problem above with deficits below. PTA patient was recently discharged from [facility] and was walking without a device both indoors and in community.  Pt currently requires +2 mod assist for bed mobility. (Further transfers TBA--limited by diarrhea this date). Currently wife would not be able to care for pt at home as he is requiring 2 person assist for bed mobiliyt. Will benefit from SNF for additional therapy prior to return home. Will continue to follow acutely to maximize functional mobility independence and safety.       Follow Up Recommendations SNF;Supervision/Assistance - 24 hour    Equipment Recommendations  Wheelchair (measurements PT);Wheelchair cushion (measurements PT);Hospital bed (if home would need medical transport)    Recommendations for Other Services       Precautions / Restrictions Precautions Precautions: Fall Restrictions Weight Bearing Restrictions: No      Mobility  Bed Mobility Overal bed mobility: Needs Assistance Bed Mobility: Rolling;Supine to Sit;Sit to Supine Rolling: Mod assist;+2 for safety/equipment   Supine to sit: +2 for physical assistance;+2 for safety/equipment;Max assist;HOB elevated Sit to supine: Max assist;+2 for physical assistance;+2 for safety/equipment   General bed mobility comments: vc for sequencing, assist for BLE, trunk elevation, Pt attempting to assist pushing up from bed rail    Transfers                 General transfer comment: NT  this session, deferred due to rectal pouch leaking once seated EOB; return to supine for cleaning and reattaching tubing to pouch  Ambulation/Gait                Stairs            Wheelchair Mobility    Modified Rankin (Stroke Patients Only)       Balance Overall balance assessment: Needs assistance Sitting-balance support: Single extremity supported;Feet supported Sitting balance-Leahy Scale: Poor Sitting balance - Comments: approaching fair                                     Pertinent Vitals/Pain Pain Assessment: Faces Faces Pain Scale: Hurts little more Pain Location: burning in bottom with rolling Pain Descriptors / Indicators: Burning Pain Intervention(s): Limited activity within patient's tolerance;Monitored during session;Repositioned    Home Living Family/patient expects to be discharged to:: Private residence Living Arrangements: Spouse/significant other Available Help at Discharge: Available 24 hours/day;Family Type of Home: House Home Access: Stairs to enter Entrance Stairs-Rails: Doctor, general practice of Steps: 2 Home Layout: One level Home Equipment: Environmental consultant - 2 wheels      Prior Function Level of Independence: Independent         Comments: as of Feb was driving and managing own meds/finances     Hand Dominance   Dominant Hand: Right    Extremity/Trunk Assessment   Upper Extremity Assessment Upper Extremity Assessment: Defer to OT evaluation    Lower Extremity Assessment Lower Extremity Assessment: Generalized weakness    Cervical / Trunk Assessment Cervical / Trunk Assessment: Normal  Communication   Communication: No difficulties  Cognition Arousal/Alertness: Lethargic;Awake/alert (initially lethargic, improved throughout session) Behavior During Therapy: WFL for tasks assessed/performed;Flat affect Overall Cognitive Status: Within Functional Limits for tasks assessed                                  General Comments: continue to monitor      General Comments General comments (skin integrity, edema, etc.): wife present at beginning and end of session, very supportive    Exercises     Assessment/Plan    PT Assessment Patient needs continued PT services  PT Problem List Decreased strength;Decreased activity tolerance;Decreased balance;Decreased mobility;Decreased knowledge of use of DME;Decreased skin integrity       PT Treatment Interventions DME instruction;Gait training;Stair training;Functional mobility training;Therapeutic activities;Therapeutic exercise;Balance training;Patient/family education    PT Goals (Current goals can be found in the Care Plan section)  Acute Rehab PT Goals Patient Stated Goal: to get stronger again PT Goal Formulation: With patient Time For Goal Achievement: 06/06/20 Potential to Achieve Goals: Good    Frequency Min 3X/week   Barriers to discharge        Co-evaluation PT/OT/SLP Co-Evaluation/Treatment: Yes Reason for Co-Treatment: To address functional/ADL transfers;Other (comment) (pt with limited endurance) PT goals addressed during session: Mobility/safety with mobility;Balance OT goals addressed during session: ADL's and self-care;Strengthening/ROM       AM-PAC PT "6 Clicks" Mobility  Outcome Measure Help needed turning from your back to your side while in a flat bed without using bedrails?: A Lot Help needed moving from lying on your back to sitting on the side of a flat bed without using bedrails?: Total Help needed moving to and from a bed to a chair (including a wheelchair)?: Total Help needed standing up from a chair using your arms (e.g., wheelchair or bedside chair)?: Total Help needed to walk in hospital room?: Total Help needed climbing 3-5 steps with a railing? : Total 6 Click Score: 7    End of Session Equipment Utilized During Treatment: Gait belt Activity Tolerance: Patient tolerated treatment  well Patient left: in bed;with call bell/phone within reach;with bed alarm set (in upright position (to pt tolerance with sore bottom)) Nurse Communication: Mobility status;Other (comment) (rectal pouch tubing disconnected (fixed now); condom cathether came off and needs replacement) PT Visit Diagnosis: Muscle weakness (generalized) (M62.81);Difficulty in walking, not elsewhere classified (R26.2)    Time: 1610-9604 PT Time Calculation (min) (ACUTE ONLY): 45 min   Charges:   PT Evaluation $PT Eval Moderate Complexity: 1 Mod           Jerolyn Center, PT Pager [phone]   Zena Amos 05/23/2020, 12:37 PM

## 2020-05-24 DIAGNOSIS — A0472 Enterocolitis due to Clostridium difficile, not specified as recurrent: Secondary | ICD-10-CM | POA: Diagnosis not present

## 2020-05-24 DIAGNOSIS — J9601 Acute respiratory failure with hypoxia: Secondary | ICD-10-CM | POA: Diagnosis not present

## 2020-05-24 DIAGNOSIS — K529 Noninfective gastroenteritis and colitis, unspecified: Secondary | ICD-10-CM | POA: Diagnosis not present

## 2020-05-24 DIAGNOSIS — Z8673 Personal history of transient ischemic attack (TIA), and cerebral infarction without residual deficits: Secondary | ICD-10-CM | POA: Diagnosis not present

## 2020-05-24 DIAGNOSIS — L98411 Non-pressure chronic ulcer of buttock limited to breakdown of skin: Secondary | ICD-10-CM

## 2020-05-24 LAB — COMPREHENSIVE METABOLIC PANEL
ALT: 21 U/L (ref 0–44)
AST: 27 U/L (ref 15–41)
Albumin: 1.9 g/dL — ABNORMAL LOW (ref 3.5–5.0)
Alkaline Phosphatase: 81 U/L (ref 38–126)
Anion gap: 10 (ref 5–15)
BUN: 12 mg/dL (ref 8–23)
CO2: 22 mmol/L (ref 22–32)
Calcium: 8.9 mg/dL (ref 8.9–10.3)
Chloride: 100 mmol/L (ref 98–111)
Creatinine, Ser: 0.81 mg/dL (ref 0.61–1.24)
GFR, Estimated: >60 mL/min (ref >60)
Glucose, Bld: 69 mg/dL — ABNORMAL LOW (ref 70–99)
Potassium: 3.4 mmol/L — ABNORMAL LOW (ref 3.5–5.1)
Sodium: 132 mmol/L — ABNORMAL LOW (ref 135–145)
Total Bilirubin: 1.8 mg/dL — ABNORMAL HIGH (ref 0.3–1.2)
Total Protein: 4.7 g/dL — ABNORMAL LOW (ref 6.5–8.1)

## 2020-05-24 LAB — CBC
HCT: 37.3 % — ABNORMAL LOW (ref 39.0–52.0)
Hemoglobin: 12.8 g/dL — ABNORMAL LOW (ref 13.0–17.0)
MCH: 32.8 pg (ref 26.0–34.0)
MCHC: 34.3 g/dL (ref 30.0–36.0)
MCV: 95.6 fL (ref 80.0–100.0)
Platelets: 165 10*3/uL (ref 150–400)
RBC: 3.9 MIL/uL — ABNORMAL LOW (ref 4.22–5.81)
RDW: 14 % (ref 11.5–15.5)
WBC: 15.3 10*3/uL — ABNORMAL HIGH (ref 4.0–10.5)
nRBC: 0 % (ref 0.0–0.2)

## 2020-05-24 MED ORDER — SODIUM CHLORIDE 0.9 % IV SOLN
1.0000 g | INTRAVENOUS | Status: DC
  Administered 2020-05-24: 1 g via INTRAVENOUS
  Filled 2020-05-24: qty 10
  Filled 2020-05-24: qty 1

## 2020-05-24 MED ORDER — OLANZAPINE 5 MG PO TABS
5.0000 mg | ORAL_TABLET | Freq: Every day | ORAL | Status: DC
  Administered 2020-05-24 – 2020-05-28 (×5): 5 mg via ORAL
  Filled 2020-05-24 (×5): qty 1

## 2020-05-24 MED ORDER — CYANOCOBALAMIN 1000 MCG/ML IJ SOLN
1000.0000 ug | INTRAMUSCULAR | Status: DC
  Administered 2020-05-30: 1000 ug via INTRAMUSCULAR
  Filled 2020-05-24 (×3): qty 1

## 2020-05-24 MED ORDER — POTASSIUM CHLORIDE CRYS ER 20 MEQ PO TBCR
40.0000 meq | EXTENDED_RELEASE_TABLET | Freq: Once | ORAL | Status: AC
  Administered 2020-05-24: 40 meq via ORAL
  Filled 2020-05-24: qty 2

## 2020-05-24 MED ORDER — CYANOCOBALAMIN 1000 MCG/ML IJ SOLN
1000.0000 ug | Freq: Every day | INTRAMUSCULAR | Status: AC
  Administered 2020-05-24 – 2020-05-29 (×6): 1000 ug via INTRAMUSCULAR
  Filled 2020-05-24 (×6): qty 1

## 2020-05-24 MED ORDER — KCL-LACTATED RINGERS-D5W 20 MEQ/L IV SOLN
INTRAVENOUS | Status: DC
  Filled 2020-05-24 (×7): qty 1000

## 2020-05-24 NOTE — Plan of Care (Signed)
  Problem: Education: Goal: Knowledge of General Education information will improve Description: Including pain rating scale, medication(s)/side effects and non-pharmacologic comfort measures Outcome: Progressing   Problem: Health Behavior/Discharge Planning: Goal: Ability to manage health-related needs will improve Outcome: Progressing   Problem: Clinical Measurements: Goal: Ability to maintain clinical measurements within normal limits will improve Outcome: Progressing Goal: Will remain free from infection Outcome: Progressing Goal: Diagnostic test results will improve Outcome: Progressing Goal: Respiratory complications will improve Outcome: Progressing Goal: Cardiovascular complication will be avoided Outcome: Progressing   Problem: Activity: Goal: Risk for activity intolerance will decrease Outcome: Progressing   Problem: Nutrition: Goal: Adequate nutrition will be maintained Outcome: Progressing   Problem: Coping: Goal: Level of anxiety will decrease Outcome: Progressing   Problem: Elimination: Goal: Will not experience complications related to bowel motility Outcome: Progressing Goal: Will not experience complications related to urinary retention Outcome: Progressing   Problem: Pain Managment: Goal: General experience of comfort will improve Outcome: Progressing   Problem: Safety: Goal: Ability to remain free from injury will improve Outcome: Progressing   Problem: Skin Integrity: Goal: Risk for impaired skin integrity will decrease Outcome: Progressing   Problem: Clinical Measurements: Goal: Diagnostic test results will improve Outcome: Progressing Goal: Signs and symptoms of infection will decrease Outcome: Progressing   Problem: Fluid Volume: Goal: Hemodynamic stability will improve Outcome: Progressing   Problem: Respiratory: Goal: Ability to maintain adequate ventilation will improve Outcome: Progressing   Problem: Safety: Goal:  Non-violent Restraint(s) Outcome: Progressing   Problem: Clinical Measurements: Goal: Diagnostic test results will improve Outcome: Progressing Goal: Signs and symptoms of infection will decrease Outcome: Progressing

## 2020-05-24 NOTE — Progress Notes (Signed)
PROGRESS NOTE  Michael Wells  OAC:[id number] DOB: 11/13/1940 DOA: 06/15/20 PCP: Marva Panda, NP  Brief Narrative: Michael Wells is a 80 y.o. 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 diff testing. Urine culture also grew E. coli. The patient was admitted, started on IVF and po vancomycin with modest improvement in symptoms, though weakness remains significant for which SNF is recommended. Hospitalization complicated by agitated delirium.  Assessment & Plan: Principal Problem:   Acute colitis Active Problems:   Sepsis (HCC)   History of embolic stroke   Skin ulcer of buttock, limited to breakdown of skin (HCC)   Hyponatremia   Acute metabolic encephalopathy   Acute respiratory failure with hypoxia (HCC)   Hyperglycemia   Clostridium difficile colitis  Severe sepsis due to C. difficile colitis:  - Pt improving on po vancomycin 125mg  QID dosing, abd exam benign, so will not increase dose at this time. Will continue for 14 days.  - Persistent leukocytosis noted, continue monitoring and start E. coli Tx as below.  - Remains IVF-dependent due to significant GI losses - Contact precautions.  - Monitor blood cultures.   Acute metabolic encephalopathy: Suspect possible underlying vascular dementia/diminished cerebral reserve leading to agitated hospital delirium. Dehydration and severe sepsis also causative. TSH 0.995, folic acid wnl.  - Delirium precautions reviewed with pt's wife at bedside and RN.  - Treat dehydration and infections - Start low dose zyprexa qHS.   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  - Continue IM supplementation.   E. coli UTI:  Unclear reliability regarding symptoms.  - Tx with ceftriaxone x3 days. - Blood cultures negative to date.  Acute hypoxic respiratory failure: Resolved. No PE on CTA  Ascending aortic aneurysm: Incidentally noted on CTA, 4.2cm.  - CTA or MRA recommended semiannually.   Prediabetes: HbA1c 5.8%.  - Add dextrose to IVF with fasting hypoglycemia  Hypokalemia: Improved with supplementation, continue supplementation.   Hyperbilirubinemia: Improving. RUQ U/S with cholelithiasis without biliary dilatation or evidence of inflammation.  Hyponatremia: Stable - IVF.  DVT prophylaxis: Lovenox Code Status: Full Family Communication: Wife at bedside Disposition Plan:  Status is: Inpatient  Remains inpatient appropriate because:Altered mental status and IV treatments appropriate due to intensity of illness or inability to take PO   Dispo: The patient is from: Home              Anticipated d/c is to: SNF              Patient currently is not medically stable to d/c.    Difficult to place patient No  Consultants:   None  Procedures:   None  Antimicrobials:  Oral vancomycin  Ceftriaxone 5/11 - 5/13   Subjective: Pt confused, pulled out IV just before my arrival. Not eating much but will take pills in apple sauce. Denies pain to me, intermittently follows commands, mostly lays with eyes closed.   Objective: Vitals:   05/23/20 0700 05/23/20 2028 05/24/20 0641 05/24/20 0720  BP: (!) 106/57 118/72 113/71 120/70  Pulse: 81 (!) 102 92 92  Resp: 19 18 15 20   Temp: 98.1 F (36.7 C) 97.9 F (36.6 C)  (!)  96.8 F (36 C)  TempSrc: Oral Oral  Axillary  SpO2: 100% 100% 100% 100%  Weight:      Height:        Intake/Output Summary (Last 24 hours) at 05/24/2020 1510 Last data filed at 05/24/2020 0900 Gross per 24 hour  Intake 1798.66 ml  Output --  Net 1798.66 ml   Filed Weights   06/05/2020 1353  Weight: 77.1 kg    Gen: Elderly male in no distress Pulm: Non-labored breathing.  Clear to auscultation bilaterally.  CV: Regular rate and rhythm. No murmur, rub, or gallop. No JVD, no pedal edema. GI: Abdomen soft, non-tender, non-distended, with normoactive bowel sounds. No organomegaly or masses felt. Ext: Warm, no deformities Skin: No rashes, lesions or ulcers on visualized skin (buttocks not examined today) Neuro: Rousable, not cooperative with full exam. Incompletely oriented. No definite focal neurological deficits. Psych: Judgement and insight appear impaired. Mood & affect appropriate.   Data Reviewed: I have personally reviewed following labs and imaging studies  CBC: Recent Labs  Lab 05/14/2020 1429 06/08/2020 2141 05/21/20 0017 05/22/20 0849 05/23/20 0219 05/24/20 0230  WBC 34.0*  --  26.9* 19.4* 16.8* 15.3*  NEUTROABS 32.3*  --  24.1*  --   --   --   HGB 16.8 13.6 13.9 14.5 13.1 12.8*  HCT 49.1 40.0 41.2 41.7 38.3* 37.3*  MCV 96.8  --  100.0 96.1 97.0 95.6  PLT 295  --  211 208 191 165   Basic Metabolic Panel: Recent Labs  Lab 05/21/2020 1429 06/04/2020 2141 05/21/20 0017 05/22/20 0849 05/23/20 0219 05/24/20 0230  NA 131* 133* 133* 132* 133* 132*  K 4.2 2.9* 3.6 3.9 2.8* 3.4*  CL 101  --  104 102 99 100  CO2 19*  --  23 23 23 22   GLUCOSE 148*  --  97 83 68* 69*  BUN 17  --  17 18 15 12   CREATININE 1.16  --  0.98 0.89 0.91 0.81  CALCIUM 9.9  --  8.6* 8.9 8.7* 8.9  MG  --   --  1.6* 2.2  --   --    GFR: Estimated Creatinine Clearance: 70.3 mL/min (by C-G formula based on SCr of 0.81 mg/dL). Liver Function Tests: Recent Labs  Lab 06/06/2020 1429 05/21/20 0017 05/22/20 0849 05/23/20 0219 05/24/20 0230  AST 30 19 32 29 27  ALT 20 17 17 22 21   ALKPHOS 129* 86 104 85 81  BILITOT 3.7* 2.4* 2.8* 2.7* 1.8*  PROT 6.1* 4.3* 5.2* 4.9* 4.7*  ALBUMIN 2.5* 1.9* 2.1* 2.0* 1.9*   No results for input(s): LIPASE, AMYLASE in the last 168 hours. No results for input(s): AMMONIA in the last 168 hours. Coagulation Profile: Recent Labs  Lab  05/21/20 0017  INR 1.2   Cardiac Enzymes: No results for input(s): CKTOTAL, CKMB, CKMBINDEX, TROPONINI in the last 168 hours. BNP (last 3 results) No results for input(s): PROBNP in the last 8760 hours. HbA1C: No results for input(s): HGBA1C in the last 72 hours. CBG: No results for input(s): GLUCAP in the last 168 hours. Lipid Profile: No results for input(s): CHOL, HDL, LDLCALC, TRIG, CHOLHDL, LDLDIRECT in the last 72 hours. Thyroid Function Tests: No results for input(s): TSH, T4TOTAL, FREET4, T3FREE, THYROIDAB in the last 72 hours. Anemia Panel: No results for input(s): VITAMINB12, FOLATE, FERRITIN, TIBC, IRON, RETICCTPCT in the last 72 hours. Urine analysis:    Component Value Date/Time   COLORURINE YELLOW 05/21/2020 0036   APPEARANCEUR HAZY (A)  05/21/2020 0036   LABSPEC >1.046 (H) 05/21/2020 0036   PHURINE 6.0 05/21/2020 0036   GLUCOSEU NEGATIVE 05/21/2020 0036   HGBUR NEGATIVE 05/21/2020 0036   BILIRUBINUR NEGATIVE 05/21/2020 0036   KETONESUR NEGATIVE 05/21/2020 0036   PROTEINUR NEGATIVE 05/21/2020 0036   NITRITE POSITIVE (A) 05/21/2020 0036   LEUKOCYTESUR LARGE (A) 05/21/2020 0036   Recent Results (from the past 240 hour(s))  Resp Panel by RT-PCR (Flu A&B, Covid) Nasopharyngeal Swab     Status: None   Collection Time: May 25, 2020  2:01 PM   Specimen: Nasopharyngeal Swab; Nasopharyngeal(NP) swabs in vial transport medium  Result Value Ref Range Status   SARS Coronavirus 2 by RT PCR NEGATIVE NEGATIVE Final    Comment: (NOTE) SARS-CoV-2 target nucleic acids are NOT DETECTED.  The SARS-CoV-2 RNA is generally detectable in upper respiratory specimens during the acute phase of infection. The lowest concentration of SARS-CoV-2 viral copies this assay can detect is 138 copies/mL. A negative result does not preclude SARS-Cov-2 infection and should not be used as the sole basis for treatment or other patient management decisions. A negative result may occur with  improper  specimen collection/handling, submission of specimen other than nasopharyngeal swab, presence of viral mutation(s) within the areas targeted by this assay, and inadequate number of viral copies(<138 copies/mL). A negative result must be combined with clinical observations, patient history, and epidemiological information. The expected result is Negative.  Fact Sheet for Patients:  BloggerCourse.com  Fact Sheet for Healthcare Providers:  SeriousBroker.it  This test is no t yet approved or cleared by the [place] FDA and  has been authorized for detection and/or diagnosis of SARS-CoV-2 by FDA under an Emergency Use Authorization (EUA). This EUA will remain  in effect (meaning this test can be used) for the duration of the COVID-19 declaration under Section 564(b)(1) of the Act, 21 U.S.C.section 360bbb-3(b)(1), unless the authorization is terminated  or revoked sooner.       Influenza A by PCR NEGATIVE NEGATIVE Final   Influenza B by PCR NEGATIVE NEGATIVE Final    Comment: (NOTE) The Xpert Xpress SARS-CoV-2/FLU/RSV plus assay is intended as an aid in the diagnosis of influenza from Nasopharyngeal swab specimens and should not be used as a sole basis for treatment. Nasal washings and aspirates are unacceptable for Xpert Xpress SARS-CoV-2/FLU/RSV testing.  Fact Sheet for Patients: BloggerCourse.com  Fact Sheet for Healthcare Providers: SeriousBroker.it  This test is not yet approved or cleared by the [place] FDA and has been authorized for detection and/or diagnosis of SARS-CoV-2 by FDA under an Emergency Use Authorization (EUA). This EUA will remain in effect (meaning this test can be used) for the duration of the COVID-19 declaration under Section 564(b)(1) of the Act, 21 U.S.C. section 360bbb-3(b)(1), unless the authorization is terminated or revoked.  Performed at  [facility], [address], [place], [place] [address]   Gastrointestinal Panel by PCR , Stool     Status: None   Collection Time: 2020/05/25  7:33 PM   Specimen: STOOL  Result Value Ref Range Status   Campylobacter species NOT DETECTED NOT DETECTED Final   Plesimonas shigelloides NOT DETECTED NOT DETECTED Final   Salmonella species NOT DETECTED NOT DETECTED Final   Yersinia enterocolitica NOT DETECTED NOT DETECTED Final   Vibrio species NOT DETECTED NOT DETECTED Final   Vibrio cholerae NOT DETECTED NOT DETECTED Final   Enteroaggregative E coli (EAEC) NOT DETECTED NOT DETECTED Final   Enteropathogenic E coli (EPEC) NOT DETECTED NOT  DETECTED Final   Enterotoxigenic E coli (ETEC) NOT DETECTED NOT DETECTED Final   Shiga like toxin producing E coli (STEC) NOT DETECTED NOT DETECTED Final   Shigella/Enteroinvasive E coli (EIEC) NOT DETECTED NOT DETECTED Final   Cryptosporidium NOT DETECTED NOT DETECTED Final   Cyclospora cayetanensis NOT DETECTED NOT DETECTED Final   Entamoeba histolytica NOT DETECTED NOT DETECTED Final   Giardia lamblia NOT DETECTED NOT DETECTED Final   Adenovirus F40/41 NOT DETECTED NOT DETECTED Final   Astrovirus NOT DETECTED NOT DETECTED Final   Norovirus GI/GII NOT DETECTED NOT DETECTED Final   Rotavirus A NOT DETECTED NOT DETECTED Final   Sapovirus (I, II, IV, and V) NOT DETECTED NOT DETECTED Final    Comment: Performed at [facility], [address] [place], [place] [address]  C Difficile Quick Screen w PCR reflex     Status: Abnormal   Collection Time: 06/02/2020  7:39 PM   Specimen: STOOL  Result Value Ref Range Status   C Diff antigen POSITIVE (A) NEGATIVE Final   C Diff toxin NEGATIVE NEGATIVE Final   C Diff interpretation Results are indeterminate. See PCR results.  Final    Comment: Performed at [facility], [address] [place], [place] [address]  C. Diff by PCR, Reflexed     Status: Abnormal   Collection Time: 06/05/2020  7:39 PM   Result Value Ref Range Status   Toxigenic C. Difficile by PCR POSITIVE (A) NEGATIVE Final    Comment: Positive for toxigenic C. difficile with little to no toxin production. Only treat if clinical presentation suggests symptomatic illness. Performed at [facility], [address] [place], [place] [address]   Culture, blood (routine x 2)     Status: None (Preliminary result)   Collection Time: 06/09/2020  9:43 PM   Specimen: BLOOD LEFT ARM  Result Value Ref Range Status   Specimen Description BLOOD LEFT ARM  Final   Special Requests   Final    BOTTLES DRAWN AEROBIC AND ANAEROBIC Blood Culture results may not be optimal due to an inadequate volume of blood received in culture bottles   Culture   Final    NO GROWTH 4 DAYS Performed at [facility], [address] [place], [place] [address]    Report Status PENDING  Incomplete  Culture, blood (routine x 2)     Status: None (Preliminary result)   Collection Time: 05/21/2020  9:45 PM   Specimen: BLOOD  Result Value Ref Range Status   Specimen Description BLOOD SITE NOT SPECIFIED  Final   Special Requests   Final    BOTTLES DRAWN AEROBIC AND ANAEROBIC Blood Culture results may not be optimal due to an inadequate volume of blood received in culture bottles   Culture   Final    NO GROWTH 4 DAYS Performed at [facility], [address] [place], [place] [address]    Report Status PENDING  Incomplete  Culture, Urine     Status: Abnormal   Collection Time: 05/21/20 12:36 AM   Specimen: Urine, Clean Catch  Result Value Ref Range Status   Specimen Description URINE, CLEAN CATCH  Final   Special Requests   Final    NONE Performed at [facility], [address] [place], [place] [address]    Culture >=100,000 COLONIES/mL ESCHERICHIA COLI (A)  Final   Report Status 05/23/2020 FINAL  Final   Organism ID, Bacteria ESCHERICHIA COLI (A)  Final  Susceptibility   Escherichia coli - MIC*    AMPICILLIN 16 INTERMEDIATE  Intermediate     CEFAZOLIN <=4 SENSITIVE Sensitive     CEFEPIME <=0.12 SENSITIVE Sensitive     CEFTRIAXONE <=0.25 SENSITIVE Sensitive     CIPROFLOXACIN <=0.25 SENSITIVE Sensitive     GENTAMICIN <=1 SENSITIVE Sensitive     IMIPENEM <=0.25 SENSITIVE Sensitive     NITROFURANTOIN <=16 SENSITIVE Sensitive     TRIMETH/SULFA <=20 SENSITIVE Sensitive     AMPICILLIN/SULBACTAM 16 INTERMEDIATE Intermediate     PIP/TAZO 16 SENSITIVE Sensitive     * >=100,000 COLONIES/mL ESCHERICHIA COLI      Radiology Studies: US Abdomen Limited RUQ (LIVER/GB)  Result Date: 05/23/2020 CLINICAL DATA:  Elevated LFTs. EXAM: ULTRASOUND ABDOMEN LIMITED RIGHT UPPER QUADRANT COMPARISON:  CT scan May 27, 2020 FINDINGS: Gallbladder: Layering tiny gallstones evident. Gallbladder wall thickness upper normal at 2-3 mm. No pericholecystic fluid. No sonographic Murphy sign. Common bile duct: Diameter: 3-4 mm Liver: No focal lesion identified. Within normal limits in parenchymal echogenicity. Portal vein is patent on color Doppler imaging with normal direction of blood flow towards the liver. Other: Small volume ascites. Large exophytic cyst noted upper pole right kidney. IMPRESSION: Cholelithiasis without gallbladder wall thickening or pericholecystic fluid. No biliary dilatation. Electronically Signed   By: Kennith Center M.D.   On: 05/23/2020 20:54    Scheduled Meds: . aspirin EC  81 mg Oral Daily  . atorvastatin  40 mg Oral Daily  . enoxaparin (LOVENOX) injection  40 mg Subcutaneous Q24H  . mouth rinse  15 mL Mouth Rinse BID  . ticagrelor  90 mg Oral BID  . vancomycin  125 mg Oral QID   Continuous Infusions: . lactated ringers 125 mL/hr at 05/24/20 1412     LOS: 3 days   Time spent: 35 minutes.  Tyrone Nine, MD [facility] [email or web address] 05/24/2020, 3:10 PM

## 2020-05-24 NOTE — Progress Notes (Signed)
Upon assessment, pt somehow managed to remove telemetry, iv, condom cath and stool pouch while restrained and with mittens on. Restraints reinforced and pt cleaned up. Will continue to monitor pt.

## 2020-05-24 NOTE — NC FL2 (Signed)
[place] MEDICAID FL2 LEVEL OF CARE SCREENING TOOL     IDENTIFICATION  Patient Name: Michael Wells Birthdate: Jan 27, 1940 Sex: male Admission Date (Current Location): 06/02/20  [facility] and [place] Number:  Producer, television/film/video and Address:  [facility] [place], [address] [place], [place] [address]      Provider Number: [id number]  Attending Physician Name and Address:  Tyrone Nine, MD  Relative Name and Phone Number:  Dimarzo,Linda Spouse [phone]  [phone]    Current Level of Care: Hospital Recommended Level of Care: Skilled Nursing Facility Prior Approval Number:    Date Approved/Denied:   PASRR Number: [id number]  Discharge Plan: SNF    Current Diagnoses: Patient Active Problem List   Diagnosis Date Noted  . Clostridium difficile colitis 05/21/2020  . Acute colitis Jun 02, 2020  . Sepsis (HCC) 06-02-20  . History of embolic stroke 2020-06-02  . Carotid artery stenosis, symptomatic, right 02-Jun-2020  . Skin ulcer of buttock, limited to breakdown of skin (HCC) 06/02/2020  . Hyponatremia Jun 02, 2020  . Acute metabolic encephalopathy 2020-06-02  . Acute respiratory failure with hypoxia (HCC) Jun 02, 2020  . Hyperglycemia Jun 02, 2020  . Acute ischemic stroke (HCC) 03/10/2020  . 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 Chronic pain 03/09/2020    Orientation RESPIRATION BLADDER Height & Weight     Self,Place  Normal External catheter Weight: 170 lb (77.1 kg) Height:  5\' 7"  (170.2 cm)  BEHAVIORAL SYMPTOMS/MOOD NEUROLOGICAL BOWEL NUTRITION STATUS      Incontinent Diet (clear liquid diet, see discharge summary)  AMBULATORY STATUS COMMUNICATION OF NEEDS Skin   Total Care Verbally Other (Comment) (excoriation on buttocks, ecchymosis)                       Personal Care Assistance Level of Assistance  Bathing,Feeding,Dressing Bathing Assistance: Maximum assistance Feeding assistance: Limited assistance Dressing Assistance: Maximum  assistance     Functional Limitations Info  Sight,Hearing,Speech Sight Info: Adequate Hearing Info: Adequate Speech Info: Adequate    SPECIAL CARE FACTORS FREQUENCY  PT (By licensed PT),OT (By licensed OT)     PT Frequency: 5x week OT Frequency: 5x week            Contractures Contractures Info: Not present    Additional Factors Info  Code Status,Allergies Code Status Info: full Allergies Info: NKA           Current Medications (05/24/2020):  This is the current hospital active medication list Current Facility-Administered Medications  Medication Dose Route Frequency Provider Last Rate Last Admin  . acetaminophen (TYLENOL) tablet 650 mg  650 mg Oral Q6H PRN 07/24/2020, MD   650 mg at 05/23/20 2105   Or  . acetaminophen (TYLENOL) suppository 650 mg  650 mg Rectal Q6H PRN 2106, MD      . aspirin EC tablet 81 mg  81 mg Oral Daily Shalhoub, Marinda Elk, MD   81 mg at 05/24/20 0919  . atorvastatin (LIPITOR) tablet 40 mg  40 mg Oral Daily Shalhoub, 07/24/20, MD   40 mg at 05/24/20 0919  . enoxaparin (LOVENOX) injection 40 mg  40 mg Subcutaneous Q24H Shalhoub, 07/24/20, MD   40 mg at 05/24/20 0919  . lactated ringers infusion   Intravenous Continuous 07/24/20, MD 125 mL/hr at 05/24/20 0225 New Bag at 05/24/20 0225  . MEDLINE mouth rinse  15 mL Mouth Rinse BID 07/24/20, MD   15 mL at 05/24/20  6712  . melatonin tablet 10 mg  10 mg Oral QHS PRN Marinda Elk, MD   10 mg at 05/23/20 2106  . ondansetron (ZOFRAN) tablet 4 mg  4 mg Oral Q6H PRN Shalhoub, Deno Lunger, MD       Or  . ondansetron [facility]) injection 4 mg  4 mg Intravenous Q6H PRN Shalhoub, Deno Lunger, MD      . ticagrelor [facility]) tablet 90 mg  90 mg Oral BID Marinda Elk, MD   90 mg at 05/24/20 0919  . vancomycin (VANCOCIN) 50 mg/mL oral solution 125 mg  125 mg Oral QID Marinda Elk, MD   125 mg at 05/24/20 0920  . Zinc Oxide (TRIPLE PASTE) 12.8 % ointment   Topical PRN  Jerald Kief, MD         Discharge Medications: Please see discharge summary for a list of discharge medications.  Relevant Imaging Results:  Relevant Lab Results:   Additional Information SSN : [id number].  Pt is vaccinated for covid and boosted.  Lorri Frederick, LCSW

## 2020-05-24 NOTE — Progress Notes (Signed)
Pt off telemetry monitor again, despite being in mittens. Upon arrival to pt's room, iv no longer in place as well. Pt confused and keeps saying he wants his wife to come get him. MD paged.

## 2020-05-24 NOTE — TOC Initial Note (Signed)
Transition of Care [facility]) - Initial/Assessment Note    Patient Details  Name: Michael Wells MRN: [id number] Date of Birth: January 23, 1940  [facility] CM/SW Contact:    Joanne Chars, LCSW Phone Number: 05/24/2020, 11:01 AM  Clinical Narrative:  CSW met with pt to discuss discharge recommendations for SNF.  Pt awake but nods off several times.  Pt is agreeable to SNF, permission given to speak with wife.  Pt is vaccinated for covid and boosted.  Choice document left in room.   CSW spoke with wife Michael Wells by phone.  She is also in agreement with plan for SNF, on her way to the hospital.  Discussed sending out referral in hub and choice.  CSW will follow up with bed offers.                Expected Discharge Plan: Skilled Nursing Facility Barriers to Discharge: Continued Medical Work up,SNF Pending bed offer   Patient Goals and CMS Choice Patient states their goals for this hospitalization and ongoing recovery are:: unable to state goal CMS Medicare.gov Compare Post Acute Care list provided to:: Patient Represenative (must comment) Choice offered to / list presented to : Spouse  Expected Discharge Plan and Services Expected Discharge Plan: [place] Choice: [place] arrangements for the past 2 months: Single Family Home                                      Prior Living Arrangements/Services Living arrangements for the past 2 months: Single Family Home Lives with:: Spouse Patient language and need for interpreter reviewed:: Yes Do you feel safe going back to the place where you live?: Yes      Need for Family Participation in Patient Care: Yes (Comment) Care giver support system in place?: Yes (comment) Current home services: Other (comment) (none) Criminal Activity/Legal Involvement Pertinent to Current Situation/Hospitalization: No - Comment as needed  Activities of Daily Living Home Assistive  Devices/Equipment: None ADL Screening (condition at time of admission) Patient's cognitive ability adequate to safely complete daily activities?: Yes Is the patient deaf or have difficulty hearing?: No Does the patient have difficulty seeing, even when wearing glasses/contacts?: No Does the patient have difficulty concentrating, remembering, or making decisions?: No Patient able to express need for assistance with ADLs?: Yes Does the patient have difficulty dressing or bathing?: Yes Independently performs ADLs?: No Communication: Independent Dressing (OT): Needs assistance Is this a change from baseline?: Change from baseline, expected to last >3 days Grooming: Needs assistance Is this a change from baseline?: Change from baseline, expected to last >3 days Feeding: Independent Bathing: Needs assistance Is this a change from baseline?: Change from baseline, expected to last >3 days Toileting: Needs assistance Is this a change from baseline?: Change from baseline, expected to last >3days In/Out Bed: Needs assistance Is this a change from baseline?: Change from baseline, expected to last >3 days Walks in Home: Independent Does the patient have difficulty walking or climbing stairs?: Yes Weakness of Legs: Both Weakness of Arms/Hands: None  Permission Sought/Granted Permission sought to share information with : Family Supports Permission granted to share information with : Yes, Verbal Permission Granted  Share Information with NAME: wife Michael Wells           Emotional Assessment Appearance:: Appears stated age Attitude/Demeanor/Rapport: Lethargic Affect (typically observed): Unable to  Assess Orientation: : Oriented to Self,Oriented to Place Alcohol / Substance Use: Not Applicable Psych Involvement: No (comment)  Admission diagnosis:  Colitis [K52.9] Hypoxia [R09.02] Acute colitis [K52.9] Diarrhea, unspecified type [R19.7] Clostridium difficile colitis [A04.72] Patient Active  Problem List   Diagnosis Date Noted  . Clostridium difficile colitis 05/21/2020  . Acute colitis 05/15/2020  . Sepsis ([place]) 06/07/2020  . History of embolic stroke 29/29/0903  . Carotid artery stenosis, symptomatic, right 05/30/2020  . Skin ulcer of buttock, limited to breakdown of skin ([place]) 05/19/2020  . Hyponatremia 05/19/2020  . Acute metabolic encephalopathy 01/49/9692  . Acute respiratory failure with hypoxia ([place]) 06/09/2020  . Hyperglycemia 05/18/2020  . Acute ischemic stroke ([place]) 03/10/2020  . Embolic stroke ([place]) 49/32/4199  . Chronic pain 03/09/2020   PCP:  Everardo Beals, NP Pharmacy:   [id number] - [place] [address] [place] [address] Phone: [phone] Fax: [phone]  [facility] [address] [place] [address] Phone: [phone] Fax: [phone]     Social Determinants of Health (SDOH) Interventions    Readmission Risk Interventions No flowsheet data found.

## 2020-05-24 NOTE — Progress Notes (Signed)
NT came to notify that patient pulled out IV, condom cath, and tele wires. Went to assess patient and indeed patient removed IV, tele wires and condom cath, despite non-violent restraints and soft hand mittens. NT cleaned patient, placed tele wires back on, and placed a new condom cath.

## 2020-05-25 DIAGNOSIS — Z8673 Personal history of transient ischemic attack (TIA), and cerebral infarction without residual deficits: Secondary | ICD-10-CM | POA: Diagnosis not present

## 2020-05-25 DIAGNOSIS — A0472 Enterocolitis due to Clostridium difficile, not specified as recurrent: Secondary | ICD-10-CM | POA: Diagnosis not present

## 2020-05-25 DIAGNOSIS — J9601 Acute respiratory failure with hypoxia: Secondary | ICD-10-CM | POA: Diagnosis not present

## 2020-05-25 DIAGNOSIS — K529 Noninfective gastroenteritis and colitis, unspecified: Secondary | ICD-10-CM | POA: Diagnosis not present

## 2020-05-25 LAB — BASIC METABOLIC PANEL
Anion gap: 6 (ref 5–15)
BUN: 8 mg/dL (ref 8–23)
CO2: 25 mmol/L (ref 22–32)
Calcium: 8.9 mg/dL (ref 8.9–10.3)
Chloride: 104 mmol/L (ref 98–111)
Creatinine, Ser: 0.73 mg/dL (ref 0.61–1.24)
GFR, Estimated: >60 mL/min (ref >60)
Glucose, Bld: 106 mg/dL — ABNORMAL HIGH (ref 70–99)
Potassium: 3.4 mmol/L — ABNORMAL LOW (ref 3.5–5.1)
Sodium: 135 mmol/L (ref 135–145)

## 2020-05-25 LAB — CULTURE, BLOOD (ROUTINE X 2)
Culture: NO GROWTH
Culture: NO GROWTH

## 2020-05-25 LAB — MAGNESIUM: Magnesium: 1.6 mg/dL — ABNORMAL LOW (ref 1.7–2.4)

## 2020-05-25 MED ORDER — CEPHALEXIN 500 MG PO CAPS
500.0000 mg | ORAL_CAPSULE | Freq: Two times a day (BID) | ORAL | Status: AC
  Administered 2020-05-25 – 2020-05-26 (×4): 500 mg via ORAL
  Filled 2020-05-25 (×4): qty 1

## 2020-05-25 NOTE — Progress Notes (Signed)
PROGRESS NOTE  Michael Wells  LKG:[id number] DOB: 11/30/1940 DOA: 06-01-2020 PCP: Marva Panda, NP  Brief Narrative: Michael Wells is a 80 y.o. 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 diff testing. Urine culture also grew E. coli. The patient was admitted, started on IVF and po vancomycin with modest improvement in symptoms, though weakness remains significant for which SNF is recommended. Hospitalization complicated by agitated delirium.   Assessment & Plan: Principal Problem:   Acute colitis Active Problems:   Sepsis (HCC)   History of embolic stroke   Skin ulcer of buttock, limited to breakdown of skin (HCC)   Hyponatremia   Acute metabolic encephalopathy   Acute respiratory failure with hypoxia (HCC)   Hyperglycemia   Clostridium difficile colitis  Severe sepsis due to C. difficile colitis:  - Pt improving on po vancomycin 125mg  QID dosing, abd exam benign, so will not increase dose at this time. Will continue for 14 days.  - Persistent leukocytosis noted, continue monitoring and start E. coli Tx as below.  - Remains IVF-dependent due to significant GI losses. - Contact precautions. - Monitor blood cultures.  Acute metabolic encephalopathy: Suspect possible underlying vascular dementia/diminished cerebral reserve leading to agitated hospital delirium. Dehydration and severe sepsis also causative. TSH 0.995, folic acid wnl.  - Delirium precautions reviewed with pt's wife at bedside   - Treat dehydration and infections - Started low dose zyprexa qHS. Will continue this, but if lethargy continues would stop.  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  - Continue IM supplementation.   E. coli UTI: Unclear reliability regarding symptoms.  - Tx with keflex as pt is taking po and this is less likely to exacerbate C. diff than ceftriaxone.  - Blood cultures negative to date.  Acute hypoxic respiratory failure: Resolved. No PE on CTA  Ascending aortic aneurysm: Incidentally noted on CTA, 4.2cm.  - CTA or MRA recommended semiannually.   Prediabetes: HbA1c 5.8%.  - Added dextrose to IVF with fasting hypoglycemia  Hypokalemia: Improved with supplementation, continue supplementation.   Hyperbilirubinemia: Improving. RUQ U/S with cholelithiasis without biliary dilatation or evidence of inflammation.  Hyponatremia: Stable - IVF. BMP pending this morning (difficult due to pt agitation)  DVT prophylaxis: Lovenox Code Status: Full Family Communication: Wife at bedside Disposition Plan:  Status is: Inpatient  Remains inpatient appropriate because:Altered mental status and IV treatments appropriate due to intensity of illness or inability to take PO   Dispo: The patient is from: Home              Anticipated d/c is to: SNF              Patient currently is not medically stable to d/c.    Difficult to place patient No  Consultants:   None  Procedures:   None  Antimicrobials:  Oral vancomycin  Ceftriaxone 5/11 - 5/13   Subjective: Pulled out IV again this morning but is drowsy on my evaluation. Denies pain but interacts minimally. No complaints. Still with type 7 stool overnight.   Objective: Vitals:   05/23/20 2028 05/24/20 0641 05/24/20 0720 05/24/20 2014  BP: 118/72 113/71 120/70 120/74  Pulse: (!) 102 92 92 86  Resp: 18 15 20 20   Temp: 97.9 F (36.6 C)  (!) 96.8 F (36 C) (!) 97 F (36.1 C)  TempSrc: Oral  Axillary Axillary  SpO2: 100% 100% 100% 98%  Weight:      Height:        Intake/Output Summary (Last 24 hours) at 05/25/2020 1358 Last data filed at 05/24/2020 1750 Gross per 24 hour  Intake 2111.22 ml   Output 350 ml  Net 1761.22 ml   Filed Weights   06/18/2020 1353  Weight: 77.1 kg   Gen: 80 y.o. male in no distress Pulm: Nonlabored breathing room air. Clear. CV: Regular rate and rhythm. No murmur, rub, or gallop. No JVD, no dependent edema. GI: Abdomen soft, not tender diffusely, non-distended, with normoactive bowel sounds.  Ext: Warm, no deformities Skin: No new rashes, lesions or ulcers on visualized skin. Neuro: Drowsy, transiently rousable, oriented x2. Moves all extremities without focal neurological deficits. Psych: Judgement and insight appear impaired. Calm currently.  Data Reviewed: I have personally reviewed following labs and imaging studies  CBC: Recent Labs  Lab Jun 18, 2020 1429 06/18/2020 2141 05/21/20 0017 05/22/20 0849 05/23/20 0219 05/24/20 0230  WBC 34.0*  --  26.9* 19.4* 16.8* 15.3*  NEUTROABS 32.3*  --  24.1*  --   --   --   HGB 16.8 13.6 13.9 14.5 13.1 12.8*  HCT 49.1 40.0 41.2 41.7 38.3* 37.3*  MCV 96.8  --  100.0 96.1 97.0 95.6  PLT 295  --  211 208 191 165   Basic Metabolic Panel: Recent Labs  Lab 06-18-20 1429 Jun 18, 2020 2141 05/21/20 0017 05/22/20 0849 05/23/20 0219 05/24/20 0230  NA 131* 133* 133* 132* 133* 132*  K 4.2 2.9* 3.6 3.9 2.8* 3.4*  CL 101  --  104 102 99 100  CO2 19*  --  23 23 23 22   GLUCOSE 148*  --  97 83 68* 69*  BUN 17  --  17 18 15 12   CREATININE 1.16  --  0.98 0.89 0.91 0.81  CALCIUM 9.9  --  8.6* 8.9 8.7* 8.9  MG  --   --  1.6* 2.2  --   --    GFR: Estimated Creatinine Clearance: 70.3 mL/min (by C-G formula based on SCr of 0.81 mg/dL). Liver Function Tests: Recent Labs  Lab 2020-06-18 1429 05/21/20 0017 05/22/20 0849 05/23/20 0219 05/24/20 0230  AST 30 19 32 29 27  ALT 20 17 17 22 21   ALKPHOS 129* 86 104 85 81  BILITOT 3.7* 2.4* 2.8* 2.7* 1.8*  PROT 6.1* 4.3* 5.2* 4.9* 4.7*  ALBUMIN 2.5* 1.9* 2.1* 2.0* 1.9*   No results for input(s): LIPASE, AMYLASE in the last 168 hours. No results for input(s): AMMONIA  in the last 168 hours. Coagulation Profile: Recent Labs  Lab 05/21/20 0017  INR 1.2   Cardiac Enzymes: No results for input(s): CKTOTAL, CKMB, CKMBINDEX, TROPONINI in the last 168 hours. BNP (last 3 results) No results for input(s): PROBNP in the last 8760 hours. HbA1C: No results for input(s): HGBA1C in the last 72 hours. CBG: No results for input(s): GLUCAP in the last 168 hours. Lipid Profile: No results for input(s): CHOL, HDL, LDLCALC, TRIG, CHOLHDL, LDLDIRECT in the last 72 hours. Thyroid Function Tests: No results for input(s): TSH, T4TOTAL, FREET4, T3FREE, THYROIDAB in the last 72 hours. Anemia Panel: No results for input(s): VITAMINB12, FOLATE, FERRITIN, TIBC, IRON, RETICCTPCT in the last 72 hours. Urine analysis:  Component Value Date/Time   COLORURINE YELLOW 05/21/2020 0036   APPEARANCEUR HAZY (A) 05/21/2020 0036   LABSPEC >1.046 (H) 05/21/2020 0036   PHURINE 6.0 05/21/2020 0036   GLUCOSEU NEGATIVE 05/21/2020 0036   HGBUR NEGATIVE 05/21/2020 0036   BILIRUBINUR NEGATIVE 05/21/2020 0036   KETONESUR NEGATIVE 05/21/2020 0036   PROTEINUR NEGATIVE 05/21/2020 0036   NITRITE POSITIVE (A) 05/21/2020 0036   LEUKOCYTESUR LARGE (A) 05/21/2020 0036   Recent Results (from the past 240 hour(s))  Resp Panel by RT-PCR (Flu A&B, Covid) Nasopharyngeal Swab     Status: None   Collection Time: 05/29/2020  2:01 PM   Specimen: Nasopharyngeal Swab; Nasopharyngeal(NP) swabs in vial transport medium  Result Value Ref Range Status   SARS Coronavirus 2 by RT PCR NEGATIVE NEGATIVE Final    Comment: (NOTE) SARS-CoV-2 target nucleic acids are NOT DETECTED.  The SARS-CoV-2 RNA is generally detectable in upper respiratory specimens during the acute phase of infection. The lowest concentration of SARS-CoV-2 viral copies this assay can detect is 138 copies/mL. A negative result does not preclude SARS-Cov-2 infection and should not be used as the sole basis for treatment or other patient  management decisions. A negative result may occur with  improper specimen collection/handling, submission of specimen other than nasopharyngeal swab, presence of viral mutation(s) within the areas targeted by this assay, and inadequate number of viral copies(<138 copies/mL). A negative result must be combined with clinical observations, patient history, and epidemiological information. The expected result is Negative.  Fact Sheet for Patients:  BloggerCourse.com  Fact Sheet for Healthcare Providers:  SeriousBroker.it  This test is no t yet approved or cleared by the [place] FDA and  has been authorized for detection and/or diagnosis of SARS-CoV-2 by FDA under an Emergency Use Authorization (EUA). This EUA will remain  in effect (meaning this test can be used) for the duration of the COVID-19 declaration under Section 564(b)(1) of the Act, 21 U.S.C.section 360bbb-3(b)(1), unless the authorization is terminated  or revoked sooner.       Influenza A by PCR NEGATIVE NEGATIVE Final   Influenza B by PCR NEGATIVE NEGATIVE Final    Comment: (NOTE) The Xpert Xpress SARS-CoV-2/FLU/RSV plus assay is intended as an aid in the diagnosis of influenza from Nasopharyngeal swab specimens and should not be used as a sole basis for treatment. Nasal washings and aspirates are unacceptable for Xpert Xpress SARS-CoV-2/FLU/RSV testing.  Fact Sheet for Patients: BloggerCourse.com  Fact Sheet for Healthcare Providers: SeriousBroker.it  This test is not yet approved or cleared by the [place] FDA and has been authorized for detection and/or diagnosis of SARS-CoV-2 by FDA under an Emergency Use Authorization (EUA). This EUA will remain in effect (meaning this test can be used) for the duration of the COVID-19 declaration under Section 564(b)(1) of the Act, 21 U.S.C. section 360bbb-3(b)(1),  unless the authorization is terminated or revoked.  Performed at [facility], [address], [place], [place] [address]   Gastrointestinal Panel by PCR , Stool     Status: None   Collection Time: 05/30/2020  7:33 PM   Specimen: STOOL  Result Value Ref Range Status   Campylobacter species NOT DETECTED NOT DETECTED Final   Plesimonas shigelloides NOT DETECTED NOT DETECTED Final   Salmonella species NOT DETECTED NOT DETECTED Final   Yersinia enterocolitica NOT DETECTED NOT DETECTED Final   Vibrio species NOT DETECTED NOT DETECTED Final   Vibrio cholerae NOT DETECTED NOT DETECTED Final   Enteroaggregative E coli (EAEC)  NOT DETECTED NOT DETECTED Final   Enteropathogenic E coli (EPEC) NOT DETECTED NOT DETECTED Final   Enterotoxigenic E coli (ETEC) NOT DETECTED NOT DETECTED Final   Shiga like toxin producing E coli (STEC) NOT DETECTED NOT DETECTED Final   Shigella/Enteroinvasive E coli (EIEC) NOT DETECTED NOT DETECTED Final   Cryptosporidium NOT DETECTED NOT DETECTED Final   Cyclospora cayetanensis NOT DETECTED NOT DETECTED Final   Entamoeba histolytica NOT DETECTED NOT DETECTED Final   Giardia lamblia NOT DETECTED NOT DETECTED Final   Adenovirus F40/41 NOT DETECTED NOT DETECTED Final   Astrovirus NOT DETECTED NOT DETECTED Final   Norovirus GI/GII NOT DETECTED NOT DETECTED Final   Rotavirus A NOT DETECTED NOT DETECTED Final   Sapovirus (I, II, IV, and V) NOT DETECTED NOT DETECTED Final    Comment: Performed at [facility], [address] [place], [place] [address]  C Difficile Quick Screen w PCR reflex     Status: Abnormal   Collection Time: 06/01/2020  7:39 PM   Specimen: STOOL  Result Value Ref Range Status   C Diff antigen POSITIVE (A) NEGATIVE Final   C Diff toxin NEGATIVE NEGATIVE Final   C Diff interpretation Results are indeterminate. See PCR results.  Final    Comment: Performed at [facility], [address], [place], [place] [address]  C. Diff by PCR,  Reflexed     Status: Abnormal   Collection Time: 06-01-20  7:39 PM  Result Value Ref Range Status   Toxigenic C. Difficile by PCR POSITIVE (A) NEGATIVE Final    Comment: Positive for toxigenic C. difficile with little to no toxin production. Only treat if clinical presentation suggests symptomatic illness. Performed at [facility], [address], [place], [place] [address]   Culture, blood (routine x 2)     Status: None   Collection Time: 01-Jun-2020  9:43 PM   Specimen: BLOOD LEFT ARM  Result Value Ref Range Status   Specimen Description BLOOD LEFT ARM  Final   Special Requests   Final    BOTTLES DRAWN AEROBIC AND ANAEROBIC Blood Culture results may not be optimal due to an inadequate volume of blood received in culture bottles   Culture   Final    NO GROWTH 5 DAYS Performed at [facility], [address] [place], [place] [address]    Report Status 05/25/2020 FINAL  Final  Culture, blood (routine x 2)     Status: None   Collection Time: 06/01/2020  9:45 PM   Specimen: BLOOD  Result Value Ref Range Status   Specimen Description BLOOD SITE NOT SPECIFIED  Final   Special Requests   Final    BOTTLES DRAWN AEROBIC AND ANAEROBIC Blood Culture results may not be optimal due to an inadequate volume of blood received in culture bottles   Culture   Final    NO GROWTH 5 DAYS Performed at [facility], [address], [place], [place] [address]    Report Status 05/25/2020 FINAL  Final  Culture, Urine     Status: Abnormal   Collection Time: 05/21/20 12:36 AM   Specimen: Urine, Clean Catch  Result Value Ref Range Status   Specimen Description URINE, CLEAN CATCH  Final   Special Requests   Final    NONE Performed at [facility], [address] [place], [place] [address]    Culture >=100,000 COLONIES/mL ESCHERICHIA COLI (A)  Final   Report Status 05/23/2020 FINAL  Final   Organism ID, Bacteria ESCHERICHIA COLI (A)  Final      Susceptibility   Escherichia coli -  MIC*    AMPICILLIN 16 INTERMEDIATE Intermediate     CEFAZOLIN <=4 SENSITIVE Sensitive     CEFEPIME <=0.12 SENSITIVE Sensitive     CEFTRIAXONE <=0.25 SENSITIVE Sensitive     CIPROFLOXACIN <=0.25 SENSITIVE Sensitive     GENTAMICIN <=1 SENSITIVE Sensitive     IMIPENEM <=0.25 SENSITIVE Sensitive     NITROFURANTOIN <=16 SENSITIVE Sensitive     TRIMETH/SULFA <=20 SENSITIVE Sensitive     AMPICILLIN/SULBACTAM 16 INTERMEDIATE Intermediate     PIP/TAZO 16 SENSITIVE Sensitive     * >=100,000 COLONIES/mL ESCHERICHIA COLI      Radiology Studies: US Abdomen Limited RUQ (LIVER/GB)  Result Date: 05/23/2020 CLINICAL DATA:  Elevated LFTs. EXAM: ULTRASOUND ABDOMEN LIMITED RIGHT UPPER QUADRANT COMPARISON:  CT scan 10-Nov-2020 FINDINGS: Gallbladder: Layering tiny gallstones evident. Gallbladder wall thickness upper normal at 2-3 mm. No pericholecystic fluid. No sonographic Murphy sign. Common bile duct: Diameter: 3-4 mm Liver: No focal lesion identified. Within normal limits in parenchymal echogenicity. Portal vein is patent on color Doppler imaging with normal direction of blood flow towards the liver. Other: Small volume ascites. Large exophytic cyst noted upper pole right kidney. IMPRESSION: Cholelithiasis without gallbladder wall thickening or pericholecystic fluid. No biliary dilatation. Electronically Signed   By: Kennith CenterEric  Mansell M.D.   On: 05/23/2020 20:54    Scheduled Meds: . aspirin EC  81 mg Oral Daily  . atorvastatin  40 mg Oral Daily  . cephALEXin  500 mg Oral Q12H  . cyanocobalamin  1,000 mcg Intramuscular Daily   Followed by  . [START ON 05/30/2020] cyanocobalamin  1,000 mcg Intramuscular Weekly  . enoxaparin (LOVENOX) injection  40 mg Subcutaneous Q24H  . mouth rinse  15 mL Mouth Rinse BID  . OLANZapine  5 mg Oral QHS  . ticagrelor  90 mg Oral BID  . vancomycin  125 mg Oral QID   Continuous Infusions: . dextrose 5% lactated ringers with KCl 20 mEq/L 100 mL/hr at 05/25/20 0950     LOS: 4  days   Time spent: 35 minutes.  Tyrone Nineyan B Arael Piccione, MD [facility] [email or web address] 05/25/2020, 1:58 PM

## 2020-05-25 NOTE — Progress Notes (Signed)
Due to his c.diff infection, we will narrow ceftriaxone to keflex to avoid gut secreted abx. Ok per Dr. Jarvis Newcomer.  Ulyses Southward, PharmD, BCIDP, AAHIVP, CPP Infectious Disease Pharmacist 05/25/2020 9:43 AM

## 2020-05-25 NOTE — Progress Notes (Signed)
Physical Therapy Treatment Patient Details Name: Michael Wells MRN: [id number] DOB: [birth year] Today's Date: 05/25/2020    History of Present Illness Michael Wells is a 80 year old male admitted 08-Jun-2020 with complaints of diarrhea and weakness. Pt was found to have significant colitis and Cdiff. 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    PT Comments    On arrival, pt had removed IV from forearm, condom catheter, and telemetry wires despite bil mitts and wrist restraints. Patient soiled from diarrhea and session focused on bed mobility, cognition/orientation, and ability to follow commands. Patient remains confused (oriented to self and location only), requires increased time to follow simple commands, and not retaining information re: his wife's absence (repeatedly told she is not here and that visiting hours have not started, however he could not retain). Noted wife has agreed pt will need SNF.     Follow Up Recommendations  SNF;Supervision/Assistance - 24 hour     Equipment Recommendations  Wheelchair (measurements PT);Wheelchair cushion (measurements PT);Hospital bed (if home would need medical transport)    Recommendations for Other Services       Precautions / Restrictions Precautions Precautions: Fall    Mobility  Bed Mobility Overal bed mobility: Needs Assistance Bed Mobility: Rolling Rolling: Mod assist         General bed mobility comments: vc and facilitation to initiate rolling, pt would then assist but required mod assist to complete    Transfers                 General transfer comment: NT this session, deferred due to diarrhea and confusion  Ambulation/Gait                 Stairs             Wheelchair Mobility    Modified Rankin (Stroke Patients Only)       Balance                                             Cognition Arousal/Alertness: Lethargic (initially lethargic, improved throughout session) Behavior During Therapy: Restless (pulling at gown, lines) Overall Cognitive Status: Impaired/Different from baseline Area of Impairment: Orientation;Attention;Memory;Following commands;Safety/judgement;Problem solving                 Orientation Level: Time;Situation ("April" "Why am I here?") Current Attention Level: Sustained Memory: Decreased short-term memory (repeatedly asking about wife) Following Commands: Follows one step commands with increased time Safety/Judgement: Decreased awareness of safety   Problem Solving: Slow processing;Decreased initiation;Difficulty sequencing;Requires verbal cues;Requires tactile cues General Comments: cues and facilitation for all mobility      Exercises      General Comments General comments (skin integrity, edema, etc.): pt found having pulled out IV and removed some telemetry wires despite bil mitts and wrist restraints. RN notified and in to dress IV/bleeding site      Pertinent Vitals/Pain Pain Assessment: Faces Faces Pain Scale: Hurts whole lot Pain Location: bottom with cleaning Pain Descriptors / Indicators: Grimacing;Moaning Pain Intervention(s): Monitored during session;Repositioned;Other (comment) (cleaned and applied butt paste)    Home Living                      Prior Function  PT Goals (current goals can now be found in the care plan section) Acute Rehab PT Goals Patient Stated Goal: to get stronger again Time For Goal Achievement: 06/06/20 Potential to Achieve Goals: Good Progress towards PT goals: Not progressing toward goals - comment (limited by confusion and diarrhea)    Frequency    Min 2X/week      PT Plan Frequency needs to be updated    Co-evaluation PT/OT/SLP Co-Evaluation/Treatment: Yes Reason for Co-Treatment: Necessary to address  cognition/behavior during functional activity;For patient/therapist safety PT goals addressed during session: Mobility/safety with mobility        AM-PAC PT "6 Clicks" Mobility   Outcome Measure  Help needed turning from your back to your side while in a flat bed without using bedrails?: A Lot Help needed moving from lying on your back to sitting on the side of a flat bed without using bedrails?: Total Help needed moving to and from a bed to a chair (including a wheelchair)?: Total Help needed standing up from a chair using your arms (e.g., wheelchair or bedside chair)?: Total Help needed to walk in hospital room?: Total Help needed climbing 3-5 steps with a railing? : Total 6 Click Score: 7    End of Session   Activity Tolerance: Treatment limited secondary to medical complications (Comment) (confusion; buttocks pain) Patient left: in bed;with call bell/phone within reach;with bed alarm set;with restraints reapplied (bil mitts and wrist restraints) Nurse Communication: Mobility status;Other (comment) (IV out and condom catheter off on arrival) PT Visit Diagnosis: Muscle weakness (generalized) (M62.81);Difficulty in walking, not elsewhere classified (R26.2)     Time: 5284-1324 PT Time Calculation (min) (ACUTE ONLY): 34 min  Charges:  $Therapeutic Activity: 8-22 mins                      Jerolyn Center, PT Pager [phone]     Zena Amos 05/25/2020, 9:31 AM

## 2020-05-25 NOTE — Progress Notes (Signed)
Occupational Therapy Treatment Patient Details Name: Michael Wells MRN: [id number] DOB: 11-13-1940 Today's Date: 05/25/2020    History of present illness Michael Wells is a 80 year old male admitted 05/28/2020 with complaints of diarrhea and weakness. Pt was found to have significant colitis and Cdiff. 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   OT comments  This 80 yo male admitted with above presents to acute OT doing much more poorly cognitively than on eval. He is confused today, following some one step commands with increased time and cues, and decreased eye opening all different than on eval earlier this week. He will continue to benefit from acute OT with follow up at SNF still recommended.  Follow Up Recommendations  SNF    Equipment Recommendations  Other (comment) (TBD next venue)       Precautions / Restrictions Precautions Precautions: Fall Restrictions Weight Bearing Restrictions: No       Mobility Bed Mobility Overal bed mobility: Needs Assistance Bed Mobility: Rolling Rolling: Mod assist         General bed mobility comments: vc and facilitation to initiate rolling, pt would then assist but required mod assist to complete    Transfers                 General transfer comment: NT this session, deferred due to diarrhea and confusion        ADL either performed or assessed with clinical judgement   ADL Overall ADL's : Needs assistance/impaired                                       General ADL Comments: Total A today due to confusion and eyes closed most of session. Could not hold cup and drink from straw, when straw held to mouth he could suck it enough to get the water all the way up the straw into mouth     Vision Baseline Vision/History: Wears glasses Wears Glasses: At all  times Additional Comments: Kept eyes closed 95% of session          Cognition Arousal/Alertness: Lethargic (initially lethargic, improved throughout session) Behavior During Therapy: Restless (pulling at gown, lines) Overall Cognitive Status: Impaired/Different from baseline Area of Impairment: Orientation;Attention;Memory;Following commands;Safety/judgement;Problem solving                 Orientation Level: Time;Situation ("April" "Why am I here?") Current Attention Level: Sustained Memory: Decreased short-term memory (repeatedly asking about wife) Following Commands: Follows one step commands with increased time Safety/Judgement: Decreased awareness of safety   Problem Solving: Slow processing;Decreased initiation;Difficulty sequencing;Requires verbal cues;Requires tactile cues General Comments: cues and facilitation for all mobility              General Comments pt found having pulled out IV and removed some telemetry wires despite bil mitts and wrist restraints. RN notified and in to dress IV/bleeding site    Pertinent Vitals/ Pain       Pain Assessment: Faces Faces Pain Scale: Hurts whole lot Pain Location: bottom with cleaning Pain Descriptors / Indicators: Grimacing;Moaning Pain Intervention(s): Monitored during session;Other (comment) (cleaned front and back peri area and applied butt paste)         Frequency  Min 2X/week        Progress Toward Goals  OT Goals(current goals can  now be found in the care plan section)  Progress towards OT goals: Not progressing toward goals - comment (confusion today,minimal eye opening, following some one step commands with increased time)  Acute Rehab OT Goals Patient Stated Goal: unable to state OT Goal Formulation: Patient unable to participate in goal setting Time For Goal Achievement: 06/06/20 Potential to Achieve Goals: Fair  Plan Discharge plan remains appropriate    Co-evaluation    PT/OT/SLP  Co-Evaluation/Treatment: Yes Reason for Co-Treatment: Necessary to address cognition/behavior during functional activity;For patient/therapist safety PT goals addressed during session: Mobility/safety with mobility OT goals addressed during session: ADL's and self-care;Strengthening/ROM      AM-PAC OT "6 Clicks" Daily Activity     Outcome Measure   Help from another person eating meals?: Total Help from another person taking care of personal grooming?: Total Help from another person toileting, which includes using toliet, bedpan, or urinal?: Total Help from another person bathing (including washing, rinsing, drying)?: Total Help from another person to put on and taking off regular upper body clothing?: Total Help from another person to put on and taking off regular lower body clothing?: Total 6 Click Score: 6    End of Session    OT Visit Diagnosis: Unsteadiness on feet (R26.81);Other abnormalities of gait and mobility (R26.89);Repeated falls (R29.6);Muscle weakness (generalized) (M62.81);History of falling (Z91.81);Adult, failure to thrive (R62.7)   Activity Tolerance Other (comment) (pt limited by confusion)   Patient Left in bed;with call bell/phone within reach;with bed alarm set   Nurse Communication  (Pt's IV was out and bleeding when we first entered room)        Time: 4967-5916 OT Time Calculation (min): 31 min  Charges: OT General Charges $OT Visit: 1 Visit OT Treatments $Self Care/Home Management : 8-22 mins  Michael Wells, OTR/L Acute [employer] Pager [phone] Office [phone]      Michael Wells 05/25/2020, 10:11 AM

## 2020-05-25 NOTE — Plan of Care (Addendum)
Medicated for sleep and agitation.   Wrist restraints and mittens  Intact for safety. Safety precautions maintained.

## 2020-05-26 DIAGNOSIS — Z8673 Personal history of transient ischemic attack (TIA), and cerebral infarction without residual deficits: Secondary | ICD-10-CM | POA: Diagnosis not present

## 2020-05-26 DIAGNOSIS — A0472 Enterocolitis due to Clostridium difficile, not specified as recurrent: Secondary | ICD-10-CM | POA: Diagnosis not present

## 2020-05-26 DIAGNOSIS — J9601 Acute respiratory failure with hypoxia: Secondary | ICD-10-CM | POA: Diagnosis not present

## 2020-05-26 DIAGNOSIS — K529 Noninfective gastroenteritis and colitis, unspecified: Secondary | ICD-10-CM | POA: Diagnosis not present

## 2020-05-26 LAB — BASIC METABOLIC PANEL
Anion gap: 4 — ABNORMAL LOW (ref 5–15)
BUN: 7 mg/dL — ABNORMAL LOW (ref 8–23)
CO2: 25 mmol/L (ref 22–32)
Calcium: 9 mg/dL (ref 8.9–10.3)
Chloride: 106 mmol/L (ref 98–111)
Creatinine, Ser: 0.74 mg/dL (ref 0.61–1.24)
GFR, Estimated: >60 mL/min (ref >60)
Glucose, Bld: 112 mg/dL — ABNORMAL HIGH (ref 70–99)
Potassium: 3.4 mmol/L — ABNORMAL LOW (ref 3.5–5.1)
Sodium: 135 mmol/L (ref 135–145)

## 2020-05-26 LAB — CBC
HCT: 36.7 % — ABNORMAL LOW (ref 39.0–52.0)
Hemoglobin: 12.3 g/dL — ABNORMAL LOW (ref 13.0–17.0)
MCH: 32.2 pg (ref 26.0–34.0)
MCHC: 33.5 g/dL (ref 30.0–36.0)
MCV: 96.1 fL (ref 80.0–100.0)
Platelets: 143 10*3/uL — ABNORMAL LOW (ref 150–400)
RBC: 3.82 MIL/uL — ABNORMAL LOW (ref 4.22–5.81)
RDW: 14.2 % (ref 11.5–15.5)
WBC: 14.8 10*3/uL — ABNORMAL HIGH (ref 4.0–10.5)
nRBC: 0 % (ref 0.0–0.2)

## 2020-05-26 MED ORDER — MAGNESIUM SULFATE 2 GM/50ML IV SOLN
2.0000 g | Freq: Once | INTRAVENOUS | Status: AC
  Administered 2020-05-26: 2 g via INTRAVENOUS
  Filled 2020-05-26: qty 50

## 2020-05-26 NOTE — TOC Progression Note (Addendum)
Transition of Care [facility]) - Progression Note    Patient Details  Name: Michael Wells MRN: [id number] Date of Birth: 07/20/1940  [facility]) CM/SW Contact  Lorri Frederick, LCSW Phone Number: 05/26/2020, 3:20 PM  Clinical Narrative:   CSW attempted to contact wife to present bed offers.  No answer at either phone number, wife not in room.   1550-Wife called back, bed offers given.  SHe will research the options.     Expected Discharge Plan: Skilled Nursing Facility Barriers to Discharge: Continued Medical Work up,SNF Pending bed offer  Expected Discharge Plan and Services Expected Discharge Plan: Skilled Nursing Facility     Post Acute Care Choice: Skilled Nursing Facility Living arrangements for the past 2 months: Single Family Home                                       Social Determinants of Health (SDOH) Interventions    Readmission Risk Interventions No flowsheet data found.

## 2020-05-26 NOTE — Progress Notes (Signed)
PROGRESS NOTE  Michael BarlowHenry H Furrow  ZOX:[id number] DOB: 1940/04/08 DOA: 06/13/2020 PCP: Marva PandaMillsaps, Kimberly, NP  Brief Narrative: Michael Wells is a 80 y.o. 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 diff testing. Urine culture also grew E. coli. The patient was admitted, started on IVF and po vancomycin with modest improvement in symptoms, though weakness remains significant for which SNF is recommended. Hospitalization complicated by agitated delirium.   Assessment & Plan: Principal Problem:   Acute colitis Active Problems:   Sepsis (HCC)   History of embolic stroke   Skin ulcer of buttock, limited to breakdown of skin (HCC)   Hyponatremia   Acute metabolic encephalopathy   Acute respiratory failure with hypoxia (HCC)   Hyperglycemia   Clostridium difficile colitis  Severe sepsis due to C. difficile colitis:  - Pt improving on po vancomycin 125mg  QID dosing, abd exam benign, so will not increase dose at this time. Will continue for 14 days.  - Persistent leukocytosis slightly improved today.  - Continue IVF, can decrease rate with slight decrease in GI losses and improvement in po intake.  - Contact precautions. - Monitor blood cultures.  Acute metabolic encephalopathy: Suspect possible underlying vascular dementia/diminished cerebral reserve leading to agitated hospital delirium. Dehydration and severe sepsis also causative. TSH 0.995, folic acid wnl.  - Delirium precautions  - Treat dehydration and infections - Started low dose zyprexa qHS. Will continue this, but if lethargy continues would stop.  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  - Continue IM  supplementation.   E. coli UTI: Unclear reliability regarding symptoms.  - Tx with keflex as pt is taking po and this is less likely to exacerbate C. diff than ceftriaxone.  - Blood cultures negative to date.  Acute hypoxic respiratory failure: Resolved. No PE on CTA  Ascending aortic aneurysm: Incidentally noted on CTA, 4.2cm.  - CTA or MRA recommended semiannually.   Prediabetes: HbA1c 5.8%.  - Added dextrose to IVF with fasting hypoglycemia  Hypokalemia: Improved with supplementation, still slightly low. Hypomagnesemia also noted.  - Supplement magnesium and recheck both in AM. Continue K in IVF, decrease rate with improving po intake.  Hyperbilirubinemia: Improving. RUQ U/S with cholelithiasis without biliary dilatation or evidence of inflammation.  Hyponatremia: Stable - IVF. BMP pending this morning (difficult due to pt agitation)  DVT prophylaxis: Lovenox Code Status: Full Family Communication: Wife at bedside Disposition Plan:  Status is: Inpatient  Remains inpatient appropriate because:Altered mental status and IV treatments appropriate due to intensity of illness or inability to take PO   Dispo: The patient is from: Home              Anticipated d/c is to: SNF              Patient currently is not medically stable to d/c.    Difficult to place patient No  Consultants:   None  Procedures:   None  Antimicrobials:  Oral vancomycin  Ceftriaxone 5/11 - 5/13   Subjective: Slight mushiness to stool per RN report, 2 in past 24 hours charted. Pt without complaints, eating breakfast with wife's help this morning. No abd pain, no  fever.   Objective: Vitals:   05/24/20 0641 05/24/20 0720 05/24/20 2014 05/25/20 1615  BP: 113/71 120/70 120/74 117/69  Pulse: 92 92 86 92  Resp: 15 20 20 19   Temp:  (!) 96.8 F (36 C) (!) 97 F (36.1 C) 98.2 F (36.8 C)  TempSrc:  Axillary Axillary   SpO2: 100% 100% 98% 95%  Weight:      Height:       No intake or output  data in the 24 hours ending 05/26/20 1339 Filed Weights   06/12/2020 1353  Weight: 77.1 kg   Gen: 80 y.o. male in no distress Pulm: Nonlabored breathing room air. Clear. CV: Regular rate and rhythm. No murmur, rub, or gallop. No JVD, trace dependent edema. GI: Abdomen soft, not tender, non-distended, with normoactive bowel sounds.  Ext: Warm, no deformities Skin: No rashes, lesions or ulcers on visualized skin. Neuro: Drowsy but much more alert than yesterday. No focal neurological deficits. Psych: Judgement and insight appear impaired    Data Reviewed: I have personally reviewed following labs and imaging studies  CBC: Recent Labs  Lab 05/15/2020 1429 05/17/2020 2141 05/21/20 0017 05/22/20 0849 05/23/20 0219 05/24/20 0230 05/26/20 0058  WBC 34.0*  --  26.9* 19.4* 16.8* 15.3* 14.8*  NEUTROABS 32.3*  --  24.1*  --   --   --   --   HGB 16.8   < > 13.9 14.5 13.1 12.8* 12.3*  HCT 49.1   < > 41.2 41.7 38.3* 37.3* 36.7*  MCV 96.8  --  100.0 96.1 97.0 95.6 96.1  PLT 295  --  211 208 191 165 143*   < > = values in this interval not displayed.   Basic Metabolic Panel: Recent Labs  Lab 05/21/20 0017 05/22/20 0849 05/23/20 0219 05/24/20 0230 05/25/20 1044 05/26/20 0058  NA 133* 132* 133* 132* 135 135  K 3.6 3.9 2.8* 3.4* 3.4* 3.4*  CL 104 102 99 100 104 106  CO2 23 23 23 22 25 25   GLUCOSE 97 83 68* 69* 106* 112*  BUN 17 18 15 12 8  7*  CREATININE 0.98 0.89 0.91 0.81 0.73 0.74  CALCIUM 8.6* 8.9 8.7* 8.9 8.9 9.0  MG 1.6* 2.2  --   --  1.6*  --    GFR: Estimated Creatinine Clearance: 71.1 mL/min (by C-G formula based on SCr of 0.74 mg/dL). Liver Function Tests: Recent Labs  Lab 06/02/2020 1429 05/21/20 0017 05/22/20 0849 05/23/20 0219 05/24/20 0230  AST 30 19 32 29 27  ALT 20 17 17 22 21   ALKPHOS 129* 86 104 85 81  BILITOT 3.7* 2.4* 2.8* 2.7* 1.8*  PROT 6.1* 4.3* 5.2* 4.9* 4.7*  ALBUMIN 2.5* 1.9* 2.1* 2.0* 1.9*   No results for input(s): LIPASE, AMYLASE in the last 168  hours. No results for input(s): AMMONIA in the last 168 hours. Coagulation Profile: Recent Labs  Lab 05/21/20 0017  INR 1.2   Cardiac Enzymes: No results for input(s): CKTOTAL, CKMB, CKMBINDEX, TROPONINI in the last 168 hours. BNP (last 3 results) No results for input(s): PROBNP in the last 8760 hours. HbA1C: No results for input(s): HGBA1C in the last 72 hours. CBG: No results for input(s): GLUCAP in the last 168 hours. Lipid Profile: No results for input(s): CHOL, HDL, LDLCALC, TRIG, CHOLHDL, LDLDIRECT in the last 72 hours. Thyroid Function Tests: No results for input(s): TSH, T4TOTAL, FREET4, T3FREE, THYROIDAB in the last 72 hours. Anemia Panel: No results for input(s): VITAMINB12, FOLATE, FERRITIN, TIBC, IRON,  RETICCTPCT in the last 72 hours. Urine analysis:    Component Value Date/Time   COLORURINE YELLOW 05/21/2020 0036   APPEARANCEUR HAZY (A) 05/21/2020 0036   LABSPEC >1.046 (H) 05/21/2020 0036   PHURINE 6.0 05/21/2020 0036   GLUCOSEU NEGATIVE 05/21/2020 0036   HGBUR NEGATIVE 05/21/2020 0036   BILIRUBINUR NEGATIVE 05/21/2020 0036   KETONESUR NEGATIVE 05/21/2020 0036   PROTEINUR NEGATIVE 05/21/2020 0036   NITRITE POSITIVE (A) 05/21/2020 0036   LEUKOCYTESUR LARGE (A) 05/21/2020 0036   Recent Results (from the past 240 hour(s))  Resp Panel by RT-PCR (Flu A&B, Covid) Nasopharyngeal Swab     Status: None   Collection Time: 05/23/2020  2:01 PM   Specimen: Nasopharyngeal Swab; Nasopharyngeal(NP) swabs in vial transport medium  Result Value Ref Range Status   SARS Coronavirus 2 by RT PCR NEGATIVE NEGATIVE Final    Comment: (NOTE) SARS-CoV-2 target nucleic acids are NOT DETECTED.  The SARS-CoV-2 RNA is generally detectable in upper respiratory specimens during the acute phase of infection. The lowest concentration of SARS-CoV-2 viral copies this assay can detect is 138 copies/mL. A negative result does not preclude SARS-Cov-2 infection and should not be used as the sole  basis for treatment or other patient management decisions. A negative result may occur with  improper specimen collection/handling, submission of specimen other than nasopharyngeal swab, presence of viral mutation(s) within the areas targeted by this assay, and inadequate number of viral copies(<138 copies/mL). A negative result must be combined with clinical observations, patient history, and epidemiological information. The expected result is Negative.  Fact Sheet for Patients:  BloggerCourse.com  Fact Sheet for Healthcare Providers:  SeriousBroker.it  This test is no t yet approved or cleared by the [place] FDA and  has been authorized for detection and/or diagnosis of SARS-CoV-2 by FDA under an Emergency Use Authorization (EUA). This EUA will remain  in effect (meaning this test can be used) for the duration of the COVID-19 declaration under Section 564(b)(1) of the Act, 21 U.S.C.section 360bbb-3(b)(1), unless the authorization is terminated  or revoked sooner.       Influenza A by PCR NEGATIVE NEGATIVE Final   Influenza B by PCR NEGATIVE NEGATIVE Final    Comment: (NOTE) The Xpert Xpress SARS-CoV-2/FLU/RSV plus assay is intended as an aid in the diagnosis of influenza from Nasopharyngeal swab specimens and should not be used as a sole basis for treatment. Nasal washings and aspirates are unacceptable for Xpert Xpress SARS-CoV-2/FLU/RSV testing.  Fact Sheet for Patients: BloggerCourse.com  Fact Sheet for Healthcare Providers: SeriousBroker.it  This test is not yet approved or cleared by the [place] FDA and has been authorized for detection and/or diagnosis of SARS-CoV-2 by FDA under an Emergency Use Authorization (EUA). This EUA will remain in effect (meaning this test can be used) for the duration of the COVID-19 declaration under Section 564(b)(1) of the Act,  21 U.S.C. section 360bbb-3(b)(1), unless the authorization is terminated or revoked.  Performed at [facility], [address], [place], [place] [address]   Gastrointestinal Panel by PCR , Stool     Status: None   Collection Time: 06/03/2020  7:33 PM   Specimen: STOOL  Result Value Ref Range Status   Campylobacter species NOT DETECTED NOT DETECTED Final   Plesimonas shigelloides NOT DETECTED NOT DETECTED Final   Salmonella species NOT DETECTED NOT DETECTED Final   Yersinia enterocolitica NOT DETECTED NOT DETECTED Final   Vibrio species NOT DETECTED NOT DETECTED Final   Vibrio cholerae  NOT DETECTED NOT DETECTED Final   Enteroaggregative E coli (EAEC) NOT DETECTED NOT DETECTED Final   Enteropathogenic E coli (EPEC) NOT DETECTED NOT DETECTED Final   Enterotoxigenic E coli (ETEC) NOT DETECTED NOT DETECTED Final   Shiga like toxin producing E coli (STEC) NOT DETECTED NOT DETECTED Final   Shigella/Enteroinvasive E coli (EIEC) NOT DETECTED NOT DETECTED Final   Cryptosporidium NOT DETECTED NOT DETECTED Final   Cyclospora cayetanensis NOT DETECTED NOT DETECTED Final   Entamoeba histolytica NOT DETECTED NOT DETECTED Final   Giardia lamblia NOT DETECTED NOT DETECTED Final   Adenovirus F40/41 NOT DETECTED NOT DETECTED Final   Astrovirus NOT DETECTED NOT DETECTED Final   Norovirus GI/GII NOT DETECTED NOT DETECTED Final   Rotavirus A NOT DETECTED NOT DETECTED Final   Sapovirus (I, II, IV, and V) NOT DETECTED NOT DETECTED Final    Comment: Performed at [facility], [address] [place], [place] [address]  C Difficile Quick Screen w PCR reflex     Status: Abnormal   Collection Time: 05-25-20  7:39 PM   Specimen: STOOL  Result Value Ref Range Status   C Diff antigen POSITIVE (A) NEGATIVE Final   C Diff toxin NEGATIVE NEGATIVE Final   C Diff interpretation Results are indeterminate. See PCR results.  Final    Comment: Performed at [facility], [address]  [place], [place] [address]  C. Diff by PCR, Reflexed     Status: Abnormal   Collection Time: 25-May-2020  7:39 PM  Result Value Ref Range Status   Toxigenic C. Difficile by PCR POSITIVE (A) NEGATIVE Final    Comment: Positive for toxigenic C. difficile with little to no toxin production. Only treat if clinical presentation suggests symptomatic illness. Performed at [facility], [address] [place], [place] [address]   Culture, blood (routine x 2)     Status: None   Collection Time: 25-May-2020  9:43 PM   Specimen: BLOOD LEFT ARM  Result Value Ref Range Status   Specimen Description BLOOD LEFT ARM  Final   Special Requests   Final    BOTTLES DRAWN AEROBIC AND ANAEROBIC Blood Culture results may not be optimal due to an inadequate volume of blood received in culture bottles   Culture   Final    NO GROWTH 5 DAYS Performed at [facility], [address], [place], [place] [address]    Report Status 05/25/2020 FINAL  Final  Culture, blood (routine x 2)     Status: None   Collection Time: May 25, 2020  9:45 PM   Specimen: BLOOD  Result Value Ref Range Status   Specimen Description BLOOD SITE NOT SPECIFIED  Final   Special Requests   Final    BOTTLES DRAWN AEROBIC AND ANAEROBIC Blood Culture results may not be optimal due to an inadequate volume of blood received in culture bottles   Culture   Final    NO GROWTH 5 DAYS Performed at [facility], [address], [place], [place] [address]    Report Status 05/25/2020 FINAL  Final  Culture, Urine     Status: Abnormal   Collection Time: 05/21/20 12:36 AM   Specimen: Urine, Clean Catch  Result Value Ref Range Status   Specimen Description URINE, CLEAN CATCH  Final   Special Requests   Final    NONE Performed at [facility], [address], [place], [place] [address]    Culture >=100,000 COLONIES/mL  ESCHERICHIA COLI (A)  Final   Report Status 05/23/2020 FINAL  Final   Organism ID, Bacteria ESCHERICHIA COLI (A)  Final       Susceptibility   Escherichia coli - MIC*    AMPICILLIN 16 INTERMEDIATE Intermediate     CEFAZOLIN <=4 SENSITIVE Sensitive     CEFEPIME <=0.12 SENSITIVE Sensitive     CEFTRIAXONE <=0.25 SENSITIVE Sensitive     CIPROFLOXACIN <=0.25 SENSITIVE Sensitive     GENTAMICIN <=1 SENSITIVE Sensitive     IMIPENEM <=0.25 SENSITIVE Sensitive     NITROFURANTOIN <=16 SENSITIVE Sensitive     TRIMETH/SULFA <=20 SENSITIVE Sensitive     AMPICILLIN/SULBACTAM 16 INTERMEDIATE Intermediate     PIP/TAZO 16 SENSITIVE Sensitive     * >=100,000 COLONIES/mL ESCHERICHIA COLI      Radiology Studies: No results found.  Scheduled Meds: . aspirin EC  81 mg Oral Daily  . atorvastatin  40 mg Oral Daily  . cephALEXin  500 mg Oral Q12H  . cyanocobalamin  1,000 mcg Intramuscular Daily   Followed by  . [START ON 05/30/2020] cyanocobalamin  1,000 mcg Intramuscular Weekly  . enoxaparin (LOVENOX) injection  40 mg Subcutaneous Q24H  . mouth rinse  15 mL Mouth Rinse BID  . OLANZapine  5 mg Oral QHS  . ticagrelor  90 mg Oral BID  . vancomycin  125 mg Oral QID   Continuous Infusions: . dextrose 5% lactated ringers with KCl 20 mEq/L 100 mL/hr at 05/25/20 1506     LOS: 5 days   Time spent: 35 minutes.  Tyrone Nine, MD [facility] [email or web address] 05/26/2020, 1:39 PM

## 2020-05-27 DIAGNOSIS — Z8673 Personal history of transient ischemic attack (TIA), and cerebral infarction without residual deficits: Secondary | ICD-10-CM | POA: Diagnosis not present

## 2020-05-27 DIAGNOSIS — A0472 Enterocolitis due to Clostridium difficile, not specified as recurrent: Secondary | ICD-10-CM | POA: Diagnosis not present

## 2020-05-27 DIAGNOSIS — K529 Noninfective gastroenteritis and colitis, unspecified: Secondary | ICD-10-CM | POA: Diagnosis not present

## 2020-05-27 DIAGNOSIS — J9601 Acute respiratory failure with hypoxia: Secondary | ICD-10-CM | POA: Diagnosis not present

## 2020-05-27 LAB — BASIC METABOLIC PANEL
Anion gap: 7 (ref 5–15)
BUN: 7 mg/dL — ABNORMAL LOW (ref 8–23)
CO2: 23 mmol/L (ref 22–32)
Calcium: 9 mg/dL (ref 8.9–10.3)
Chloride: 105 mmol/L (ref 98–111)
Creatinine, Ser: 0.65 mg/dL (ref 0.61–1.24)
GFR, Estimated: >60 mL/min (ref >60)
Glucose, Bld: 120 mg/dL — ABNORMAL HIGH (ref 70–99)
Potassium: 3.9 mmol/L (ref 3.5–5.1)
Sodium: 135 mmol/L (ref 135–145)

## 2020-05-27 LAB — MAGNESIUM: Magnesium: 1.8 mg/dL (ref 1.7–2.4)

## 2020-05-27 NOTE — TOC Progression Note (Signed)
Transition of Care [facility]) - Progression Note    Patient Details  Name: Michael Wells MRN: [id number] Date of Birth: April 12, 1940  Transition of Care [facility]) CM/SW Contact  Carley Hammed, [place] Phone Number: 05/27/2020, 12:31 PM  Clinical Narrative:    CSW spoke with pt's spouse. She noted she would like for pt to go to [facility]. CSW left VM for [facility]. Insurance done by facility, will continue to follow.   Expected Discharge Plan: Skilled Nursing Facility Barriers to Discharge: Continued Medical Work up,SNF Pending bed offer  Expected Discharge Plan and Services Expected Discharge Plan: Skilled Nursing Facility     Post Acute Care Choice: Skilled Nursing Facility Living arrangements for the past 2 months: Single Family Home                                       Social Determinants of Health (SDOH) Interventions    Readmission Risk Interventions No flowsheet data found.

## 2020-05-27 NOTE — Progress Notes (Signed)
PROGRESS NOTE  Michael Wells  JXB:[id number] DOB: 1940-03-24 DOA: 06/01/2020 PCP: Michael Wells, Kimberly, Michael Wells  Brief Narrative: Michael Wells is a 80 y.o. 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 diff testing. Urine culture also grew E. coli. The patient was admitted, started on IVF and po vancomycin with modest improvement in symptoms, though weakness remains significant for which SNF is recommended. Hospitalization complicated by agitated delirium.   Assessment & Plan: Principal Problem:   Acute colitis Active Problems:   Sepsis (HCC)   History of embolic stroke   Skin ulcer of buttock, limited to breakdown of skin (HCC)   Hyponatremia   Acute metabolic encephalopathy   Acute respiratory failure with hypoxia (HCC)   Hyperglycemia   Clostridium difficile colitis  Severe sepsis due to C. difficile colitis:  - Pt improving on po vancomycin 125mg  QID dosing, abd exam benign, so will not increase dose at this time. Will continue for 14 days.  - Persistent leukocytosis slightly improved will be rechecked - Decreasing GI losses, somewhat improved po intake, will stop IVF. Pt having some swelling and if we surrender the IV, the patient can be without restraints.  - Contact precautions. - Monitor blood cultures.  Acute metabolic encephalopathy: Suspect possible underlying vascular dementia/diminished cerebral reserve leading to agitated hospital delirium. Dehydration and severe sepsis also causative. TSH 0.995, folic acid wnl.  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  - Continue IM supplementation.   E. coli  UTI: Unclear reliability regarding symptoms.  - Tx with keflex as pt is taking po and this is less likely to exacerbate C. diff than ceftriaxone.  - Blood cultures negative to date.  Acute hypoxic respiratory failure: Resolved. No PE on CTA  Ascending aortic aneurysm: Incidentally noted on CTA, 4.2cm.  - CTA or MRA recommended semiannually.   Prediabetes: HbA1c 5.8%.  - Added dextrose to IVF with fasting hypoglycemia  Hypokalemia: Improved with supplementation  - Supplement as needed  Hyperbilirubinemia: Improving. RUQ U/S with cholelithiasis without biliary dilatation or evidence of inflammation.  Hyponatremia: Stable  DVT prophylaxis: Lovenox Code Status: Full Family Communication: Wife at bedside Disposition Plan:  Status is: Inpatient  Remains inpatient appropriate because:Altered mental status and IV treatments appropriate due to intensity of illness or inability to take PO   Dispo: The patient is from: Home              Anticipated d/c is to: SNF              Patient currently is not medically stable to d/c.    Difficult to place patient No  Consultants:   None  Procedures:   None  Antimicrobials:  Oral vancomycin  Ceftriaxone 5/11 - 5/13   Subjective: Less watery stools, average of 1-2 per day, eating a little better. No other complaints or issues per pt or RN.  Objective: Vitals:   05/24/20 2014 05/25/20 1615 05/26/20 1534 05/26/20 2226  BP: 120/74 117/69 120/79 117/88  Pulse: 86 92 (!) 105 (!) 102  Resp: 20 19 19 20   Temp: (!) 97 F (36.1  C) 98.2 F (36.8 C) 98.7 F (37.1 C) 98.3 F (36.8 C)  TempSrc: Axillary   Oral  SpO2: 98% 95% 91% 92%  Weight:      Height:        Intake/Output Summary (Last 24 hours) at 05/27/2020 1451 Last data filed at 05/27/2020 0631 Gross per 24 hour  Intake --  Output 550 ml  Net -550 ml   Filed Weights   May 31, 2020 1353  Weight: 77.1 kg   Gen: 80 y.o. male in no distress Pulm: Nonlabored breathing room  air. Clear. CV: Regular rate and rhythm. No murmur, rub, or gallop. No JVD, 1+ pitting dependent edema involving LEs and groin. GI: Abdomen soft, non-tender, non-distended, with normoactive bowel sounds.  Ext: Warm, no deformities Skin: No new rashes, lesions or ulcers on visualized skin. Neuro: Alert and disoriented, moves all extremities without definite focal neurological deficits. Psych: Judgement and insight appear impaired. Mood euthymic & affect congruent. Behavior is appropriate.      Data Reviewed: I have personally reviewed following labs and imaging studies  CBC: Recent Labs  Lab 05/21/20 0017 05/22/20 0849 05/23/20 0219 05/24/20 0230 05/26/20 0058  WBC 26.9* 19.4* 16.8* 15.3* 14.8*  NEUTROABS 24.1*  --   --   --   --   HGB 13.9 14.5 13.1 12.8* 12.3*  HCT 41.2 41.7 38.3* 37.3* 36.7*  MCV 100.0 96.1 97.0 95.6 96.1  PLT 211 208 191 165 143*   Basic Metabolic Panel: Recent Labs  Lab 05/21/20 0017 05/22/20 0849 05/23/20 0219 05/24/20 0230 05/25/20 1044 05/26/20 0058 05/27/20 0033  NA 133* 132* 133* 132* 135 135 135  K 3.6 3.9 2.8* 3.4* 3.4* 3.4* 3.9  CL 104 102 99 100 104 106 105  CO2 23 23 23 22 25 25 23   GLUCOSE 97 83 68* 69* 106* 112* 120*  BUN 17 18 15 12 8  7* 7*  CREATININE 0.98 0.89 0.91 0.81 0.73 0.74 0.65  CALCIUM 8.6* 8.9 8.7* 8.9 8.9 9.0 9.0  MG 1.6* 2.2  --   --  1.6*  --  1.8   GFR: Estimated Creatinine Clearance: 71.1 mL/min (by C-G formula based on SCr of 0.65 mg/dL). Liver Function Tests: Recent Labs  Lab 05/21/20 0017 05/22/20 0849 05/23/20 0219 05/24/20 0230  AST 19 32 29 27  ALT 17 17 22 21   ALKPHOS 86 104 85 81  BILITOT 2.4* 2.8* 2.7* 1.8*  PROT 4.3* 5.2* 4.9* 4.7*  ALBUMIN 1.9* 2.1* 2.0* 1.9*   No results for input(s): LIPASE, AMYLASE in the last 168 hours. No results for input(s): AMMONIA in the last 168 hours. Coagulation Profile: Recent Labs  Lab 05/21/20 0017  INR 1.2   Cardiac Enzymes: No results for input(s):  CKTOTAL, CKMB, CKMBINDEX, TROPONINI in the last 168 hours. BNP (last 3 results) No results for input(s): PROBNP in the last 8760 hours. HbA1C: No results for input(s): HGBA1C in the last 72 hours. CBG: No results for input(s): GLUCAP in the last 168 hours. Lipid Profile: No results for input(s): CHOL, HDL, LDLCALC, TRIG, CHOLHDL, LDLDIRECT in the last 72 hours. Thyroid Function Tests: No results for input(s): TSH, T4TOTAL, FREET4, T3FREE, THYROIDAB in the last 72 hours. Anemia Panel: No results for input(s): VITAMINB12, FOLATE, FERRITIN, TIBC, IRON, RETICCTPCT in the last 72 hours. Urine analysis:    Component Value Date/Time   COLORURINE YELLOW 05/21/2020 0036   APPEARANCEUR HAZY (A) 05/21/2020 0036   LABSPEC >1.046 (H) 05/21/2020 0036   PHURINE 6.0 05/21/2020 0036  GLUCOSEU NEGATIVE 05/21/2020 0036   HGBUR NEGATIVE 05/21/2020 0036   BILIRUBINUR NEGATIVE 05/21/2020 0036   KETONESUR NEGATIVE 05/21/2020 0036   PROTEINUR NEGATIVE 05/21/2020 0036   NITRITE POSITIVE (A) 05/21/2020 0036   LEUKOCYTESUR LARGE (A) 05/21/2020 0036   Recent Results (from the past 240 hour(s))  Resp Panel by RT-PCR (Flu A&B, Covid) Nasopharyngeal Swab     Status: None   Collection Time: 05/23/2020  2:01 PM   Specimen: Nasopharyngeal Swab; Nasopharyngeal(Michael Wells) swabs in vial transport medium  Result Value Ref Range Status   SARS Coronavirus 2 by RT PCR NEGATIVE NEGATIVE Final    Comment: (NOTE) SARS-CoV-2 target nucleic acids are NOT DETECTED.  The SARS-CoV-2 RNA is generally detectable in upper respiratory specimens during the acute phase of infection. The lowest concentration of SARS-CoV-2 viral copies this assay can detect is 138 copies/mL. A negative result does not preclude SARS-Cov-2 infection and should not be used as the sole basis for treatment or other patient management decisions. A negative result may occur with  improper specimen collection/handling, submission of specimen other than  nasopharyngeal swab, presence of viral mutation(s) within the areas targeted by this assay, and inadequate number of viral copies(<138 copies/mL). A negative result must be combined with clinical observations, patient history, and epidemiological information. The expected result is Negative.  Fact Sheet for Patients:  BloggerCourse.com  Fact Sheet for Healthcare Providers:  SeriousBroker.it  This test is no t yet approved or cleared by the [place] FDA and  has been authorized for detection and/or diagnosis of SARS-CoV-2 by FDA under an Emergency Use Authorization (EUA). This EUA will remain  in effect (meaning this test can be used) for the duration of the COVID-19 declaration under Section 564(b)(1) of the Act, 21 U.S.C.section 360bbb-3(b)(1), unless the authorization is terminated  or revoked sooner.       Influenza A by PCR NEGATIVE NEGATIVE Final   Influenza B by PCR NEGATIVE NEGATIVE Final    Comment: (NOTE) The Xpert Xpress SARS-CoV-2/FLU/RSV plus assay is intended as an aid in the diagnosis of influenza from Nasopharyngeal swab specimens and should not be used as a sole basis for treatment. Nasal washings and aspirates are unacceptable for Xpert Xpress SARS-CoV-2/FLU/RSV testing.  Fact Sheet for Patients: BloggerCourse.com  Fact Sheet for Healthcare Providers: SeriousBroker.it  This test is not yet approved or cleared by the [place] FDA and has been authorized for detection and/or diagnosis of SARS-CoV-2 by FDA under an Emergency Use Authorization (EUA). This EUA will remain in effect (meaning this test can be used) for the duration of the COVID-19 declaration under Section 564(b)(1) of the Act, 21 U.S.C. section 360bbb-3(b)(1), unless the authorization is terminated or revoked.  Performed at [facility], [address] [place], [place] [address]    Gastrointestinal Panel by PCR , Stool     Status: None   Collection Time: 06/07/2020  7:33 PM   Specimen: STOOL  Result Value Ref Range Status   Campylobacter species NOT DETECTED NOT DETECTED Final   Plesimonas shigelloides NOT DETECTED NOT DETECTED Final   Salmonella species NOT DETECTED NOT DETECTED Final   Yersinia enterocolitica NOT DETECTED NOT DETECTED Final   Vibrio species NOT DETECTED NOT DETECTED Final   Vibrio cholerae NOT DETECTED NOT DETECTED Final   Enteroaggregative E coli (EAEC) NOT DETECTED NOT DETECTED Final   Enteropathogenic E coli (EPEC) NOT DETECTED NOT DETECTED Final   Enterotoxigenic E coli (ETEC) NOT DETECTED NOT DETECTED Final   Shiga like  toxin producing E coli (STEC) NOT DETECTED NOT DETECTED Final   Shigella/Enteroinvasive E coli (EIEC) NOT DETECTED NOT DETECTED Final   Cryptosporidium NOT DETECTED NOT DETECTED Final   Cyclospora cayetanensis NOT DETECTED NOT DETECTED Final   Entamoeba histolytica NOT DETECTED NOT DETECTED Final   Giardia lamblia NOT DETECTED NOT DETECTED Final   Adenovirus F40/41 NOT DETECTED NOT DETECTED Final   Astrovirus NOT DETECTED NOT DETECTED Final   Norovirus GI/GII NOT DETECTED NOT DETECTED Final   Rotavirus A NOT DETECTED NOT DETECTED Final   Sapovirus (I, II, IV, and V) NOT DETECTED NOT DETECTED Final    Comment: Performed at [facility], [address] [place], [place] [address]  C Difficile Quick Screen w PCR reflex     Status: Abnormal   Collection Time: 06/16/2020  7:39 PM   Specimen: STOOL  Result Value Ref Range Status   C Diff antigen POSITIVE (A) NEGATIVE Final   C Diff toxin NEGATIVE NEGATIVE Final   C Diff interpretation Results are indeterminate. See PCR results.  Final    Comment: Performed at [facility], [address], [place], [place] [address]  C. Diff by PCR, Reflexed     Status: Abnormal   Collection Time: 2020-06-16  7:39 PM  Result Value Ref Range Status   Toxigenic C. Difficile by PCR  POSITIVE (A) NEGATIVE Final    Comment: Positive for toxigenic C. difficile with little to no toxin production. Only treat if clinical presentation suggests symptomatic illness. Performed at [facility], [address], [place], [place] [address]   Culture, blood (routine x 2)     Status: None   Collection Time: June 16, 2020  9:43 PM   Specimen: BLOOD LEFT ARM  Result Value Ref Range Status   Specimen Description BLOOD LEFT ARM  Final   Special Requests   Final    BOTTLES DRAWN AEROBIC AND ANAEROBIC Blood Culture results may not be optimal due to an inadequate volume of blood received in culture bottles   Culture   Final    NO GROWTH 5 DAYS Performed at [facility], [address] [place], [place] [address]    Report Status 05/25/2020 FINAL  Final  Culture, blood (routine x 2)     Status: None   Collection Time: 06/16/2020  9:45 PM   Specimen: BLOOD  Result Value Ref Range Status   Specimen Description BLOOD SITE NOT SPECIFIED  Final   Special Requests   Final    BOTTLES DRAWN AEROBIC AND ANAEROBIC Blood Culture results may not be optimal due to an inadequate volume of blood received in culture bottles   Culture   Final    NO GROWTH 5 DAYS Performed at [facility], [address], [place], [place] [address]    Report Status 05/25/2020 FINAL  Final  Culture, Urine     Status: Abnormal   Collection Time: 05/21/20 12:36 AM   Specimen: Urine, Clean Catch  Result Value Ref Range Status   Specimen Description URINE, CLEAN CATCH  Final   Special Requests   Final    NONE Performed at [facility], [address], [place], [place] [address]    Culture >=100,000 COLONIES/mL ESCHERICHIA COLI (A)  Final   Report Status 05/23/2020 FINAL  Final   Organism ID, Bacteria ESCHERICHIA COLI (A)  Final      Susceptibility   Escherichia coli - MIC*    AMPICILLIN 16 INTERMEDIATE Intermediate  CEFAZOLIN <=4 SENSITIVE Sensitive     CEFEPIME <=0.12 SENSITIVE Sensitive      CEFTRIAXONE <=0.25 SENSITIVE Sensitive     CIPROFLOXACIN <=0.25 SENSITIVE Sensitive     GENTAMICIN <=1 SENSITIVE Sensitive     IMIPENEM <=0.25 SENSITIVE Sensitive     NITROFURANTOIN <=16 SENSITIVE Sensitive     TRIMETH/SULFA <=20 SENSITIVE Sensitive     AMPICILLIN/SULBACTAM 16 INTERMEDIATE Intermediate     PIP/TAZO 16 SENSITIVE Sensitive     * >=100,000 COLONIES/mL ESCHERICHIA COLI      Radiology Studies: No results found.  Scheduled Meds: . aspirin EC  81 mg Oral Daily  . atorvastatin  40 mg Oral Daily  . cyanocobalamin  1,000 mcg Intramuscular Daily   Followed by  . [START ON 05/30/2020] cyanocobalamin  1,000 mcg Intramuscular Weekly  . enoxaparin (LOVENOX) injection  40 mg Subcutaneous Q24H  . mouth rinse  15 mL Mouth Rinse BID  . OLANZapine  5 mg Oral QHS  . ticagrelor  90 mg Oral BID  . vancomycin  125 mg Oral QID   Continuous Infusions: . dextrose 5% lactated ringers with KCl 20 mEq/L Stopped (05/27/20 1151)     LOS: 6 days   Time spent: 35 minutes.  Tyrone Nine, MD [facility] [email or web address] 05/27/2020, 2:51 PM

## 2020-05-27 NOTE — Plan of Care (Signed)
  Problem: Clinical Measurements: Goal: Respiratory complications will improve Outcome: Progressing   Problem: Skin Integrity: Goal: Risk for impaired skin integrity will decrease Outcome: Progressing   Problem: Safety: Goal: Non-violent Restraint(s) Outcome: Progressing   Problem: Education: Goal: Knowledge of General Education information will improve Description: Including pain rating scale, medication(s)/side effects and non-pharmacologic comfort measures Outcome: Not Progressing  Pt confused.

## 2020-05-28 ENCOUNTER — Inpatient Hospital Stay (HOSPITAL_COMMUNITY): Payer: Medicare (Managed Care)

## 2020-05-28 DIAGNOSIS — J9601 Acute respiratory failure with hypoxia: Secondary | ICD-10-CM | POA: Diagnosis not present

## 2020-05-28 DIAGNOSIS — K529 Noninfective gastroenteritis and colitis, unspecified: Secondary | ICD-10-CM | POA: Diagnosis not present

## 2020-05-28 DIAGNOSIS — Z8673 Personal history of transient ischemic attack (TIA), and cerebral infarction without residual deficits: Secondary | ICD-10-CM | POA: Diagnosis not present

## 2020-05-28 DIAGNOSIS — A0472 Enterocolitis due to Clostridium difficile, not specified as recurrent: Secondary | ICD-10-CM | POA: Diagnosis not present

## 2020-05-28 LAB — CBC
HCT: 41.9 % (ref 39.0–52.0)
Hemoglobin: 14.5 g/dL (ref 13.0–17.0)
MCH: 33 pg (ref 26.0–34.0)
MCHC: 34.6 g/dL (ref 30.0–36.0)
MCV: 95.4 fL (ref 80.0–100.0)
Platelets: 151 10*3/uL (ref 150–400)
RBC: 4.39 MIL/uL (ref 4.22–5.81)
RDW: 14.2 % (ref 11.5–15.5)
WBC: 21.6 10*3/uL — ABNORMAL HIGH (ref 4.0–10.5)
nRBC: 0 % (ref 0.0–0.2)

## 2020-05-28 LAB — BASIC METABOLIC PANEL
Anion gap: 9 (ref 5–15)
BUN: 11 mg/dL (ref 8–23)
CO2: 23 mmol/L (ref 22–32)
Calcium: 9.1 mg/dL (ref 8.9–10.3)
Chloride: 105 mmol/L (ref 98–111)
Creatinine, Ser: 0.72 mg/dL (ref 0.61–1.24)
GFR, Estimated: >60 mL/min (ref >60)
Glucose, Bld: 112 mg/dL — ABNORMAL HIGH (ref 70–99)
Potassium: 3.5 mmol/L (ref 3.5–5.1)
Sodium: 137 mmol/L (ref 135–145)

## 2020-05-28 MED ORDER — POTASSIUM CHLORIDE CRYS ER 20 MEQ PO TBCR: 40.0000 meq | EXTENDED_RELEASE_TABLET | Freq: Once | ORAL | Status: DC

## 2020-05-28 MED ORDER — FUROSEMIDE 10 MG/ML IJ SOLN
40.0000 mg | Freq: Once | INTRAMUSCULAR | Status: AC
  Administered 2020-05-28: 40 mg via INTRAVENOUS
  Filled 2020-05-28: qty 4

## 2020-05-28 MED ORDER — FUROSEMIDE 40 MG PO TABS: 40.0000 mg | ORAL_TABLET | Freq: Once | ORAL | Status: DC

## 2020-05-28 MED ORDER — POTASSIUM CHLORIDE 10 MEQ/100ML IV SOLN
10.0000 meq | INTRAVENOUS | Status: AC
  Administered 2020-05-28 (×4): 10 meq via INTRAVENOUS
  Filled 2020-05-28 (×4): qty 100

## 2020-05-28 NOTE — Progress Notes (Signed)
PROGRESS NOTE  [name]  ZOX:[id number] DOB: 1940-08-11 DOA: 06/08/20 PCP: Marva Panda, NP  Brief Narrative: Michael Wells is a 80 y.o. 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 diff testing. Urine culture also grew E. coli. The patient was admitted, started on IVF and po vancomycin with modest improvement in symptoms, though weakness remains significant for which SNF is recommended. Hospitalization complicated by agitated delirium.   Assessment & Plan: Principal Problem:   Acute colitis Active Problems:   Sepsis (HCC)   History of embolic stroke   Skin ulcer of buttock, limited to breakdown of skin (HCC)   Hyponatremia   Acute metabolic encephalopathy   Acute respiratory failure with hypoxia (HCC)   Hyperglycemia   Clostridium difficile colitis  Severe sepsis due to C. difficile colitis:  - Pt improving on po vancomycin  QID dosing, abd exam benign, so will not increase dose at this time. Will continue for 14 days.  - Persistent leukocytosis (though all cell lines up after stopping IVF). - Holding IVF for now - Contact precautions. - Monitor blood cultures.  Hypoxia: 2L O2 overnight though no low O2 was documented - CXR this AM personally reviewed showing lower lung volumes with compressive bibasilar atelectasis. Strongly encourage IS. - Wean O2 if able today - No pulmonary edema on CXR or exam.  Acute metabolic encephalopathy: Suspect possible underlying vascular dementia/diminished cerebral reserve leading to agitated hospital delirium. Dehydration and severe sepsis also causative. TSH 0.995, folic acid wnl.  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  - Continue IM supplementation.   E. coli UTI: Unclear reliability regarding symptoms.  - Tx with keflex   - Blood cultures negative to date.  Acute hypoxic respiratory failure: Resolved. No PE on CTA  Ascending aortic aneurysm: Incidentally noted on CTA, 4.2cm.  - CTA or MRA recommended semiannually.   Prediabetes: HbA1c 5.8%.   Hypokalemia: Improved with supplementation, down a bit after stopping IVF - Give po supplement since we're going to give a dose of lasix. He's still quite swollen after IVF.  Hyperbilirubinemia: Improving. RUQ U/S with cholelithiasis without biliary dilatation or evidence of inflammation.  Hyponatremia: Stable  DVT prophylaxis: Lovenox Code Status: Full Family Communication: Wife by phone at length Disposition Plan:  Status is: Inpatient  Remains inpatient appropriate because:Altered mental status and IV treatments appropriate due to intensity of illness or inability to take PO   Dispo: The patient is from: Home              Anticipated d/c is to: SNF              Patient currently is not medically stable to d/c.    Difficult to place patient No  Consultants:   None  Procedures:   None  Antimicrobials:  Oral vancomycin  Ceftriaxone 5/11 - 5/13   Subjective: Having 2 BMs over past 24 hours. No abd pain. Still some pain in the backside. No chest pain or dyspnea. He's eating pretty well when he's fully awake and assisted. Requested ice cream. Was much more lucid this  morning, now less so. Waxes and wanes considerably.    Objective: Vitals:   05/26/20 2226 05/27/20 1500 05/27/20 2223 05/28/20 0534  BP: 117/88 104/73 (!) 113/93 121/75  Pulse: (!) 102 94 (!) 101 98  Resp: 20 18  20   Temp: 98.3 F (36.8 C) 98.1 F (36.7 C) 97.6 F (36.4 C) 98.7 F (37.1 C)  TempSrc: Oral Axillary  Oral  SpO2: 92%  97% 95%  Weight:      Height:       No intake or output data  in the 24 hours ending 05/28/20 1136 Filed Weights   2020/05/27 1353  Weight: 77.1 kg   Gen: Frail, elderly male in no distress Pulm: Nonlabored breathing, no crackles or wheezes. CV: Regular rate and rhythm. No murmur, rub, or gallop. No JVD, 2+ dependent edema. GI: Abdomen soft, edematous without tenderness or distention, active bowel sounds.  Ext: Warm, no deformities Skin: No new rashes, lesions or ulcers on visualized skin. Buttocks not examined today. Neuro: Sleeping but rouses and is interactive, incompletely oriented. Moves all extremities. Psych: Judgement and insight appear marginal this AM. Mood euthymic & affect congruent. Calm.      Data Reviewed: I have personally reviewed following labs and imaging studies  CBC: Recent Labs  Lab 05/22/20 0849 05/23/20 0219 05/24/20 0230 05/26/20 0058 05/28/20 0342  WBC 19.4* 16.8* 15.3* 14.8* 21.6*  HGB 14.5 13.1 12.8* 12.3* 14.5  HCT 41.7 38.3* 37.3* 36.7* 41.9  MCV 96.1 97.0 95.6 96.1 95.4  PLT 208 191 165 143* 151   Basic Metabolic Panel: Recent Labs  Lab 05/22/20 0849 05/23/20 0219 05/24/20 0230 05/25/20 1044 05/26/20 0058 05/27/20 0033 05/28/20 0342  NA 132*   < > 132* 135 135 135 137  K 3.9   < > 3.4* 3.4* 3.4* 3.9 3.5  CL 102   < > 100 104 106 105 105  CO2 23   < > 22 25 25 23 23   GLUCOSE 83   < > 69* 106* 112* 120* 112*  BUN 18   < > 12 8 7* 7* 11  CREATININE 0.89   < > 0.81 0.73 0.74 0.65 0.72  CALCIUM 8.9   < > 8.9 8.9 9.0 9.0 9.1  MG 2.2  --   --  1.6*  --  1.8  --    < > = values in this interval not displayed.   GFR: Estimated Creatinine Clearance: 71.1 mL/min (by C-G formula based on SCr of 0.72 mg/dL). Liver Function Tests: Recent Labs  Lab 05/22/20 0849 05/23/20 0219 05/24/20 0230  AST 32 29 27  ALT 17 22 21   ALKPHOS 104 85 81  BILITOT 2.8* 2.7* 1.8*  PROT 5.2* 4.9* 4.7*  ALBUMIN 2.1* 2.0* 1.9*   No results for input(s): LIPASE, AMYLASE in the last 168 hours. No results for input(s):  AMMONIA in the last 168 hours. Coagulation Profile: No results for input(s): INR, PROTIME in the last 168 hours. Cardiac Enzymes: No results for input(s): CKTOTAL, CKMB, CKMBINDEX, TROPONINI in the last 168 hours. BNP (last 3 results) No results for input(s): PROBNP in the last 8760 hours. HbA1C: No results for input(s): HGBA1C in the last 72 hours. CBG: No results for input(s): GLUCAP in the last 168 hours. Lipid Profile: No results for input(s): CHOL, HDL, LDLCALC, TRIG, CHOLHDL, LDLDIRECT in the last 72 hours. Thyroid Function Tests: No results for input(s): TSH, T4TOTAL, FREET4, T3FREE, THYROIDAB in the last 72 hours. Anemia Panel: No results for input(s):  VITAMINB12, FOLATE, FERRITIN, TIBC, IRON, RETICCTPCT in the last 72 hours. Urine analysis:    Component Value Date/Time   COLORURINE YELLOW 05/21/2020 0036   APPEARANCEUR HAZY (A) 05/21/2020 0036   LABSPEC >1.046 (H) 05/21/2020 0036   PHURINE 6.0 05/21/2020 0036   GLUCOSEU NEGATIVE 05/21/2020 0036   HGBUR NEGATIVE 05/21/2020 0036   BILIRUBINUR NEGATIVE 05/21/2020 0036   KETONESUR NEGATIVE 05/21/2020 0036   PROTEINUR NEGATIVE 05/21/2020 0036   NITRITE POSITIVE (A) 05/21/2020 0036   LEUKOCYTESUR LARGE (A) 05/21/2020 0036   Recent Results (from the past 240 hour(s))  Resp Panel by RT-PCR (Flu A&B, Covid) Nasopharyngeal Swab     Status: None   Collection Time: 05/24/2020  2:01 PM   Specimen: Nasopharyngeal Swab; Nasopharyngeal(NP) swabs in vial transport medium  Result Value Ref Range Status   SARS Coronavirus 2 by RT PCR NEGATIVE NEGATIVE Final    Comment: (NOTE) SARS-CoV-2 target nucleic acids are NOT DETECTED.  The SARS-CoV-2 RNA is generally detectable in upper respiratory specimens during the acute phase of infection. The lowest concentration of SARS-CoV-2 viral copies this assay can detect is 138 copies/mL. A negative result does not preclude SARS-Cov-2 infection and should not be used as the sole basis for  treatment or other patient management decisions. A negative result may occur with  improper specimen collection/handling, submission of specimen other than nasopharyngeal swab, presence of viral mutation(s) within the areas targeted by this assay, and inadequate number of viral copies(<138 copies/mL). A negative result must be combined with clinical observations, patient history, and epidemiological information. The expected result is Negative.  Fact Sheet for Patients:  BloggerCourse.com  Fact Sheet for Healthcare Providers:  SeriousBroker.it  This test is no t yet approved or cleared by the [place] FDA and  has been authorized for detection and/or diagnosis of SARS-CoV-2 by FDA under an Emergency Use Authorization (EUA). This EUA will remain  in effect (meaning this test can be used) for the duration of the COVID-19 declaration under Section 564(b)(1) of the Act, 21 U.S.C.section 360bbb-3(b)(1), unless the authorization is terminated  or revoked sooner.       Influenza A by PCR NEGATIVE NEGATIVE Final   Influenza B by PCR NEGATIVE NEGATIVE Final    Comment: (NOTE) The Xpert Xpress SARS-CoV-2/FLU/RSV plus assay is intended as an aid in the diagnosis of influenza from Nasopharyngeal swab specimens and should not be used as a sole basis for treatment. Nasal washings and aspirates are unacceptable for Xpert Xpress SARS-CoV-2/FLU/RSV testing.  Fact Sheet for Patients: BloggerCourse.com  Fact Sheet for Healthcare Providers: SeriousBroker.it  This test is not yet approved or cleared by the [place] FDA and has been authorized for detection and/or diagnosis of SARS-CoV-2 by FDA under an Emergency Use Authorization (EUA). This EUA will remain in effect (meaning this test can be used) for the duration of the COVID-19 declaration under Section 564(b)(1) of the Act, 21  U.S.C. section 360bbb-3(b)(1), unless the authorization is terminated or revoked.  Performed at [facility], [address] [place], [place] [address]   Gastrointestinal Panel by PCR , Stool     Status: None   Collection Time: 05/18/2020  7:33 PM   Specimen: STOOL  Result Value Ref Range Status   Campylobacter species NOT DETECTED NOT DETECTED Final   Plesimonas shigelloides NOT DETECTED NOT DETECTED Final   Salmonella species NOT DETECTED NOT DETECTED Final   Yersinia enterocolitica NOT DETECTED NOT DETECTED Final   Vibrio species NOT DETECTED NOT DETECTED  Final   Vibrio cholerae NOT DETECTED NOT DETECTED Final   Enteroaggregative E coli (EAEC) NOT DETECTED NOT DETECTED Final   Enteropathogenic E coli (EPEC) NOT DETECTED NOT DETECTED Final   Enterotoxigenic E coli (ETEC) NOT DETECTED NOT DETECTED Final   Shiga like toxin producing E coli (STEC) NOT DETECTED NOT DETECTED Final   Shigella/Enteroinvasive E coli (EIEC) NOT DETECTED NOT DETECTED Final   Cryptosporidium NOT DETECTED NOT DETECTED Final   Cyclospora cayetanensis NOT DETECTED NOT DETECTED Final   Entamoeba histolytica NOT DETECTED NOT DETECTED Final   Giardia lamblia NOT DETECTED NOT DETECTED Final   Adenovirus F40/41 NOT DETECTED NOT DETECTED Final   Astrovirus NOT DETECTED NOT DETECTED Final   Norovirus GI/GII NOT DETECTED NOT DETECTED Final   Rotavirus A NOT DETECTED NOT DETECTED Final   Sapovirus (I, II, IV, and V) NOT DETECTED NOT DETECTED Final    Comment: Performed at [facility], [address] [place], [place] [address]  C Difficile Quick Screen w PCR reflex     Status: Abnormal   Collection Time: June 15, 2020  7:39 PM   Specimen: STOOL  Result Value Ref Range Status   C Diff antigen POSITIVE (A) NEGATIVE Final   C Diff toxin NEGATIVE NEGATIVE Final   C Diff interpretation Results are indeterminate. See PCR results.  Final    Comment: Performed at [facility], [address],  [place], [place] [address]  C. Diff by PCR, Reflexed     Status: Abnormal   Collection Time: 2020/06/15  7:39 PM  Result Value Ref Range Status   Toxigenic C. Difficile by PCR POSITIVE (A) NEGATIVE Final    Comment: Positive for toxigenic C. difficile with little to no toxin production. Only treat if clinical presentation suggests symptomatic illness. Performed at [facility], [address] [place], [place] [address]   Culture, blood (routine x 2)     Status: None   Collection Time: 06/15/2020  9:43 PM   Specimen: BLOOD LEFT ARM  Result Value Ref Range Status   Specimen Description BLOOD LEFT ARM  Final   Special Requests   Final    BOTTLES DRAWN AEROBIC AND ANAEROBIC Blood Culture results may not be optimal due to an inadequate volume of blood received in culture bottles   Culture   Final    NO GROWTH 5 DAYS Performed at [facility], [address] [place], [place] [address]    Report Status 05/25/2020 FINAL  Final  Culture, blood (routine x 2)     Status: None   Collection Time: 06/15/20  9:45 PM   Specimen: BLOOD  Result Value Ref Range Status   Specimen Description BLOOD SITE NOT SPECIFIED  Final   Special Requests   Final    BOTTLES DRAWN AEROBIC AND ANAEROBIC Blood Culture results may not be optimal due to an inadequate volume of blood received in culture bottles   Culture   Final    NO GROWTH 5 DAYS Performed at [facility], [address], [place], [place] [address]    Report Status 05/25/2020 FINAL  Final  Culture, Urine     Status: Abnormal   Collection Time: 05/21/20 12:36 AM   Specimen: Urine, Clean Catch  Result Value Ref Range Status   Specimen Description URINE, CLEAN CATCH  Final   Special Requests   Final    NONE Performed at [facility], [address], [place], [place] [address]  Culture >=100,000 COLONIES/mL ESCHERICHIA COLI (A)  Final   Report Status 05/23/2020 FINAL  Final   Organism ID, Bacteria ESCHERICHIA COLI (A)  Final       Susceptibility   Escherichia coli - MIC*    AMPICILLIN 16 INTERMEDIATE Intermediate     CEFAZOLIN <=4 SENSITIVE Sensitive     CEFEPIME <=0.12 SENSITIVE Sensitive     CEFTRIAXONE <=0.25 SENSITIVE Sensitive     CIPROFLOXACIN <=0.25 SENSITIVE Sensitive     GENTAMICIN <=1 SENSITIVE Sensitive     IMIPENEM <=0.25 SENSITIVE Sensitive     NITROFURANTOIN <=16 SENSITIVE Sensitive     TRIMETH/SULFA <=20 SENSITIVE Sensitive     AMPICILLIN/SULBACTAM 16 INTERMEDIATE Intermediate     PIP/TAZO 16 SENSITIVE Sensitive     * >=100,000 COLONIES/mL ESCHERICHIA COLI      Radiology Studies: DG CHEST PORT 1 VIEW  Result Date: 05/28/2020 CLINICAL DATA:  Respiratory distress.  Evaluate pleural effusions. EXAM: PORTABLE CHEST 1 VIEW COMPARISON:  06/09/2020 FINDINGS: Heart size and mediastinal contours are stable. Lung volumes are low. No pleural effusion or interstitial edema identified. Platelike atelectasis noted in the left lower lung. IMPRESSION: 1. Low lung volumes. 2. Platelike atelectasis in the left lower lung. Electronically Signed   By: Signa Kellaylor  Stroud M.D.   On: 05/28/2020 09:50    Scheduled Meds: . aspirin EC  81 mg Oral Daily  . atorvastatin  40 mg Oral Daily  . cyanocobalamin  1,000 mcg Intramuscular Daily   Followed by  . [START ON 05/30/2020] cyanocobalamin  1,000 mcg Intramuscular Weekly  . enoxaparin (LOVENOX) injection  40 mg Subcutaneous Q24H  . mouth rinse  15 mL Mouth Rinse BID  . OLANZapine  5 mg Oral QHS  . ticagrelor  90 mg Oral BID  . vancomycin  125 mg Oral QID   Continuous Infusions:    LOS: 7 days   Time spent: 35 minutes.  Tyrone Nineyan B Anija Brickner, MD [facility] [email or web address] 05/28/2020, 11:36 AM

## 2020-05-28 NOTE — TOC Progression Note (Signed)
Transition of Care [facility]) - Progression Note    Patient Details  Name: Michael Wells MRN: [id number] Date of Birth: 1940-12-31  Transition of Care [facility] CM/SW Contact  Carley Hammed, [place] Phone Number: 05/28/2020, 11:28 AM  Clinical Narrative:    CSW attempted to follow up w/ facility again, unable to contact anyone, VM left. [facility]i confirmed they do not manage this pt's insurance. TOC will continue to follow for DC needs.   Expected Discharge Plan: Skilled Nursing Facility Barriers to Discharge: Continued Medical Work up,SNF Pending bed offer  Expected Discharge Plan and Services Expected Discharge Plan: Skilled Nursing Facility     Post Acute Care Choice: Skilled Nursing Facility Living arrangements for the past 2 months: Single Family Home                                       Social Determinants of Health (SDOH) Interventions    Readmission Risk Interventions No flowsheet data found.

## 2020-05-29 ENCOUNTER — Inpatient Hospital Stay (HOSPITAL_COMMUNITY): Payer: Medicare (Managed Care)

## 2020-05-29 DIAGNOSIS — A0472 Enterocolitis due to Clostridium difficile, not specified as recurrent: Secondary | ICD-10-CM | POA: Diagnosis not present

## 2020-05-29 DIAGNOSIS — J9601 Acute respiratory failure with hypoxia: Secondary | ICD-10-CM | POA: Diagnosis not present

## 2020-05-29 DIAGNOSIS — K529 Noninfective gastroenteritis and colitis, unspecified: Secondary | ICD-10-CM | POA: Diagnosis not present

## 2020-05-29 DIAGNOSIS — Z8673 Personal history of transient ischemic attack (TIA), and cerebral infarction without residual deficits: Secondary | ICD-10-CM | POA: Diagnosis not present

## 2020-05-29 LAB — CBC WITH DIFFERENTIAL/PLATELET
Abs Immature Granulocytes: 0.26 10*3/uL — ABNORMAL HIGH (ref 0.00–0.07)
Basophils Absolute: 0 10*3/uL (ref 0.0–0.1)
Basophils Relative: 0 %
Eosinophils Absolute: 0 10*3/uL (ref 0.0–0.5)
Eosinophils Relative: 0 %
HCT: 40.2 % (ref 39.0–52.0)
Hemoglobin: 13.9 g/dL (ref 13.0–17.0)
Immature Granulocytes: 1 %
Lymphocytes Relative: 5 %
Lymphs Abs: 0.9 10*3/uL (ref 0.7–4.0)
MCH: 32.9 pg (ref 26.0–34.0)
MCHC: 34.6 g/dL (ref 30.0–36.0)
MCV: 95.3 fL (ref 80.0–100.0)
Monocytes Absolute: 0.8 10*3/uL (ref 0.1–1.0)
Monocytes Relative: 4 %
Neutro Abs: 17.5 10*3/uL — ABNORMAL HIGH (ref 1.7–7.7)
Neutrophils Relative %: 90 %
Platelets: 168 10*3/uL (ref 150–400)
RBC: 4.22 MIL/uL (ref 4.22–5.81)
RDW: 14.1 % (ref 11.5–15.5)
WBC: 19.5 10*3/uL — ABNORMAL HIGH (ref 4.0–10.5)
nRBC: 0.1 % (ref 0.0–0.2)

## 2020-05-29 LAB — BASIC METABOLIC PANEL
Anion gap: 8 (ref 5–15)
BUN: 13 mg/dL (ref 8–23)
CO2: 23 mmol/L (ref 22–32)
Calcium: 9.3 mg/dL (ref 8.9–10.3)
Chloride: 106 mmol/L (ref 98–111)
Creatinine, Ser: 0.82 mg/dL (ref 0.61–1.24)
GFR, Estimated: >60 mL/min (ref >60)
Glucose, Bld: 91 mg/dL (ref 70–99)
Potassium: 3.5 mmol/L (ref 3.5–5.1)
Sodium: 137 mmol/L (ref 135–145)

## 2020-05-29 LAB — MAGNESIUM: Magnesium: 1.7 mg/dL (ref 1.7–2.4)

## 2020-05-29 LAB — D-DIMER, QUANTITATIVE: D-Dimer, Quant: 4.52 ug/mL-FEU — ABNORMAL HIGH (ref 0.00–0.50)

## 2020-05-29 MED ORDER — POTASSIUM CHLORIDE 10 MEQ/100ML IV SOLN
10.0000 meq | INTRAVENOUS | Status: AC
  Administered 2020-05-29 (×4): 10 meq via INTRAVENOUS
  Filled 2020-05-29 (×4): qty 100

## 2020-05-29 MED ORDER — TECHNETIUM TO 99M ALBUMIN AGGREGATED
4.4000 | Freq: Once | INTRAVENOUS | Status: AC | PRN
  Administered 2020-05-29: 4.4 via INTRAVENOUS

## 2020-05-29 MED ORDER — ASPIRIN 81 MG PO CHEW
81.0000 mg | CHEWABLE_TABLET | Freq: Every day | ORAL | Status: DC
  Administered 2020-05-29 – 2020-05-31 (×3): 81 mg via ORAL
  Filled 2020-05-29 (×3): qty 1

## 2020-05-29 MED ORDER — FUROSEMIDE 10 MG/ML IJ SOLN
40.0000 mg | Freq: Once | INTRAMUSCULAR | Status: AC
  Administered 2020-05-29: 40 mg via INTRAVENOUS
  Filled 2020-05-29: qty 4

## 2020-05-29 NOTE — TOC Progression Note (Signed)
Transition of Care [facility]) - Progression Note    Patient Details  Name: Michael Wells MRN: [id number] Date of Birth: May 18, 1940  [facility]) CM/SW Contact  Lorri Frederick, LCSW Phone Number: 05/29/2020, 9:14 AM  Clinical Narrative:  CSW spoke with Alvino Chapel, administrator at [facility] to confirm bed offer.  She will initiate the authorization.  They would have bed space tomorrow.       Expected Discharge Plan: Skilled Nursing Facility Barriers to Discharge: Continued Medical Work up,SNF Pending bed offer  Expected Discharge Plan and Services Expected Discharge Plan: Skilled Nursing Facility     Post Acute Care Choice: Skilled Nursing Facility Living arrangements for the past 2 months: Single Family Home                                       Social Determinants of Health (SDOH) Interventions    Readmission Risk Interventions No flowsheet data found.

## 2020-05-29 NOTE — Progress Notes (Signed)
Physical Therapy Treatment Patient Details Name: Michael Wells MRN: [id number] DOB: 08-10-1940 Today's Date: 05/29/2020    History of Present Illness Arron Tetrault is a 80 year old male admitted 06/03/2020 with complaints of diarrhea and weakness. Pt was found to have significant colitis and Cdiff. 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    PT Comments    Pt continues to make slow progress with mobility. Able to take a few steps at EOB. Treatment again limited by bowel incontinence.    Follow Up Recommendations  SNF;Supervision/Assistance - 24 hour     Equipment Recommendations  Wheelchair (measurements PT);Wheelchair cushion (measurements PT);Hospital bed (if home would need medical transport)    Recommendations for Other Services       Precautions / Restrictions Precautions Precautions: Fall    Mobility  Bed Mobility Overal bed mobility: Needs Assistance Bed Mobility: Rolling;Supine to Sit;Sit to Supine Rolling: Mod assist   Supine to sit: Mod assist;HOB elevated Sit to supine: Max assist   General bed mobility comments: Assist to bring legs off of bed, elevate trunk into sitting and bring hips to EOB. Assist to bring legs back up into bed returning to supine and reposition trunk as pt went straight back instead of toward pillow.    Transfers Overall transfer level: Needs assistance Equipment used: Rolling walker (2 wheeled) Transfers: Sit to/from Stand Sit to Stand: Mod assist;From elevated surface         General transfer comment: Assist to bring hips up and for balance. Had pt place hands on walker handles to facilitate anterior wt shift.  Ambulation/Gait Ambulation/Gait assistance: Mod assist Gait Distance (Feet): 2 Feet Assistive device: Rolling walker (2 wheeled) Gait Pattern/deviations: Step-to pattern;Decreased  step length - right;Decreased step length - left;Shuffle Gait velocity: decr Gait velocity interpretation: <1.31 ft/sec, indicative of household ambulator General Gait Details: Assist for balance and support as pt side stepped up side of bed. Pt then incontinent of bowel.   Stairs             Wheelchair Mobility    Modified Rankin (Stroke Patients Only)       Balance Overall balance assessment: Needs assistance Sitting-balance support: Bilateral upper extremity supported;Feet supported Sitting balance-Leahy Scale: Poor Sitting balance - Comments: UE support and posterior lean but no physical assist needed Postural control: Posterior lean Standing balance support: Bilateral upper extremity supported;During functional activity Standing balance-Leahy Scale: Poor Standing balance comment: walker and mod assist for static standing                            Cognition Arousal/Alertness: Lethargic;Awake/alert (initially lethargic, once mobilizing pt awake and alert) Behavior During Therapy: Restless (pulling at gown, lines) Overall Cognitive Status: Impaired/Different from baseline Area of Impairment: Orientation;Attention;Memory;Following commands;Safety/judgement;Problem solving                 Orientation Level: Time;Situation Current Attention Level: Sustained Memory: Decreased short-term memory Following Commands: Follows one step commands with increased time Safety/Judgement: Decreased awareness of safety   Problem Solving: Slow processing;Decreased initiation;Requires verbal cues;Requires tactile cues        Exercises      General Comments        Pertinent Vitals/Pain Pain Assessment: Faces Faces Pain Scale: Hurts whole lot Pain Location: bottom with cleaning Pain Descriptors / Indicators: Grimacing;Moaning Pain Intervention(s): Monitored during session;Limited activity within  patient's tolerance;Repositioned    Home Living                       Prior Function            PT Goals (current goals can now be found in the care plan section) Progress towards PT goals: Progressing toward goals    Frequency    Min 2X/week      PT Plan Current plan remains appropriate    Co-evaluation              AM-PAC PT "6 Clicks" Mobility   Outcome Measure  Help needed turning from your back to your side while in a flat bed without using bedrails?: A Lot Help needed moving from lying on your back to sitting on the side of a flat bed without using bedrails?: A Lot Help needed moving to and from a bed to a chair (including a wheelchair)?: Total Help needed standing up from a chair using your arms (e.g., wheelchair or bedside chair)?: A Lot Help needed to walk in hospital room?: Total Help needed climbing 3-5 steps with a railing? : Total 6 Click Score: 9    End of Session Equipment Utilized During Treatment: Gait belt Activity Tolerance: Other (comment) (incontinent of stool x 2) Patient left: in bed;with call bell/phone within reach;with bed alarm set Nurse Communication: Mobility status PT Visit Diagnosis: Muscle weakness (generalized) (M62.81);Difficulty in walking, not elsewhere classified (R26.2)     Time: 4481-8563 PT Time Calculation (min) (ACUTE ONLY): 34 min  Charges:  $Therapeutic Activity: 23-37 mins                     [facility] PT Acute Rehabilitation Services Pager [phone] Office [phone]    Angelina Ok [facility] 05/29/2020, 2:30 PM

## 2020-05-29 NOTE — Progress Notes (Signed)
PROGRESS NOTE  Michael BarlowHenry H Smucker  ZOX:[id number] DOB: 01-12-1941 DOA: 05/25/2020 PCP: Marva PandaMillsaps, Kimberly, NP  Brief Narrative: Michael Wells is a 80 y.o. 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 diff testing. Urine culture also grew E. coli. The patient was admitted, started on IVF and po vancomycin with modest improvement in symptoms, though weakness remains significant for which SNF is recommended. Hospitalization complicated by agitated delirium.   Assessment & Plan: Principal Problem:   Acute colitis Active Problems:   Sepsis (HCC)   History of embolic stroke   Skin ulcer of buttock, limited to breakdown of skin (HCC)   Hyponatremia   Acute metabolic encephalopathy   Acute respiratory failure with hypoxia (HCC)   Hyperglycemia   Clostridium difficile colitis  Severe sepsis due to C. difficile colitis:  - Pt improving on po vancomycin 125mg  QID dosing, abd exam benign, so will not increase dose at this time. Will continue for 14 days.  - Persistent leukocytosis, though patient is afebrile. Stool characteristics becoming less watery. Abd benign.  - Contact precautions. - Monitor blood cultures.  Acute hypoxic respiratory failure due to atelectasis: CXR without infiltrate or pulmonary edema.  - Wean oxygen as tolerated - Encourage use of IS. D/w family, pt, and RN at bedside this AM.  - Check d-dimer. If this is positive AND patient unable to wean to room air, would need PE r/o. CTA on admission was negative.   Acute metabolic encephalopathy: Suspect possible underlying vascular dementia/diminished cerebral reserve leading to agitated hospital delirium. Dehydration and severe sepsis also causative. TSH 0.995, folic acid wnl.  - Delirium precautions  - DC melatonin and qHS zyprexa in hopes of making him more alert. He is less agitated on this  though.  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  - Continue IM supplementation.   E. coli UTI: Unclear reliability regarding symptoms.  - Tx with keflex   - Blood cultures negative to date.  Ascending aortic aneurysm: Incidentally noted on CTA, 4.2cm.  - CTA or MRA recommended semiannually.   Prediabetes: HbA1c 5.8%.   Hypokalemia: Improved with supplementation, down a bit after stopping IVF - Repeat supplementation w/repeated loop diuretic today.    Hyperbilirubinemia: Improving. RUQ U/S with cholelithiasis without biliary dilatation or evidence of inflammation.  Hyponatremia: Stable  DVT prophylaxis: Lovenox Code Status: Full Family Communication: Wife by phone at length Disposition Plan:  Status is: Inpatient  Remains inpatient appropriate because:Altered mental status and IV treatments appropriate due to intensity of illness or inability to take PO   Dispo: The patient is from: Home              Anticipated d/c is to: SNF              Patient currently is not medically stable to d/c.    Difficult to place patient No  Consultants:   None  Procedures:   None  Antimicrobials:  Oral vancomycin  Ceftriaxone 5/11 - 5/13   Subjective: Stools personally seen and has form to them, no blood/melena. RN and wife report he's been groggy. He can swallow ok when he's alert, but he's been in and  out. When I interview him he does awaken and speak interactively, but does fall back asleep. He has no complaints of pain.   Objective: Vitals:   05/27/20 1500 05/27/20 2223 05/28/20 0534 05/29/20 0303  BP: 104/73 (!) 113/93 121/75 (!) 141/93  Pulse: 94 (!) 101 98 97  Resp: 18  20 18   Temp: 98.1 F (36.7 C) 97.6 F (36.4 C) 98.7 F (37.1 C) 98.3 F (36.8 C)  TempSrc: Axillary  Oral    SpO2:  97% 95% 90%  Weight:      Height:       No intake or output data in the 24 hours ending 05/29/20 1101 Filed Weights   Jun 08, 2020 1353  Weight: 77.1 kg   Gen: Elderly male in no distress Pulm: Nonlabored breathing. Clear. CV: Regular rate and rhythm. No murmur, rub, or gallop. No JVD, 1+ (improved) pitting dependent edema. GI: Abdomen soft, non-tender, non-distended, with normoactive bowel sounds.  Ext: Warm, no deformities Skin: No new rashes, lesions or ulcers on visualized skin. Neuro: Rousable and incompletely oriented. Moves extremities.  Psych: Judgement and insight appear marginal-to-impaired. Mood euthymic & affect congruent. Behavior is appropriate.      Data Reviewed: I have personally reviewed following labs and imaging studies  CBC: Recent Labs  Lab 05/23/20 0219 05/24/20 0230 05/26/20 0058 05/28/20 0342 05/29/20 0350  WBC 16.8* 15.3* 14.8* 21.6* 19.5*  NEUTROABS  --   --   --   --  17.5*  HGB 13.1 12.8* 12.3* 14.5 13.9  HCT 38.3* 37.3* 36.7* 41.9 40.2  MCV 97.0 95.6 96.1 95.4 95.3  PLT 191 165 143* 151 168   Basic Metabolic Panel: Recent Labs  Lab 05/25/20 1044 05/26/20 0058 05/27/20 0033 05/28/20 0342 05/29/20 0350  NA 135 135 135 137 137  K 3.4* 3.4* 3.9 3.5 3.5  CL 104 106 105 105 106  CO2 25 25 23 23 23   GLUCOSE 106* 112* 120* 112* 91  BUN 8 7* 7* 11 13  CREATININE 0.73 0.74 0.65 0.72 0.82  CALCIUM 8.9 9.0 9.0 9.1 9.3  MG 1.6*  --  1.8  --  1.7   GFR: Estimated Creatinine Clearance: 69.4 mL/min (by C-G formula based on SCr of 0.82 mg/dL). Liver Function Tests: Recent Labs  Lab 05/23/20 0219 05/24/20 0230  AST 29 27  ALT 22 21  ALKPHOS 85 81  BILITOT 2.7* 1.8*  PROT 4.9* 4.7*  ALBUMIN 2.0* 1.9*   No results for input(s): LIPASE, AMYLASE in the last 168 hours. No results for input(s): AMMONIA in the last 168 hours. Coagulation Profile: No results for input(s): INR, PROTIME in the last 168 hours. Cardiac Enzymes: No results  for input(s): CKTOTAL, CKMB, CKMBINDEX, TROPONINI in the last 168 hours. BNP (last 3 results) No results for input(s): PROBNP in the last 8760 hours. HbA1C: No results for input(s): HGBA1C in the last 72 hours. CBG: No results for input(s): GLUCAP in the last 168 hours. Lipid Profile: No results for input(s): CHOL, HDL, LDLCALC, TRIG, CHOLHDL, LDLDIRECT in the last 72 hours. Thyroid Function Tests: No results for input(s): TSH, T4TOTAL, FREET4, T3FREE, THYROIDAB in the last 72 hours. Anemia Panel: No results for input(s): VITAMINB12, FOLATE, FERRITIN, TIBC, IRON, RETICCTPCT in the last 72 hours. Urine analysis:    Component Value Date/Time   COLORURINE YELLOW 05/21/2020 0036   APPEARANCEUR HAZY (A) 05/21/2020 0036   LABSPEC >1.046 (H) 05/21/2020 0036   PHURINE 6.0 05/21/2020 0036   GLUCOSEU NEGATIVE 05/21/2020 0036  HGBUR NEGATIVE 05/21/2020 0036   BILIRUBINUR NEGATIVE 05/21/2020 0036   KETONESUR NEGATIVE 05/21/2020 0036   PROTEINUR NEGATIVE 05/21/2020 0036   NITRITE POSITIVE (A) 05/21/2020 0036   LEUKOCYTESUR LARGE (A) 05/21/2020 0036   Recent Results (from the past 240 hour(s))  Resp Panel by RT-PCR (Flu A&B, Covid) Nasopharyngeal Swab     Status: None   Collection Time: 27-May-2020  2:01 PM   Specimen: Nasopharyngeal Swab; Nasopharyngeal(NP) swabs in vial transport medium  Result Value Ref Range Status   SARS Coronavirus 2 by RT PCR NEGATIVE NEGATIVE Final    Comment: (NOTE) SARS-CoV-2 target nucleic acids are NOT DETECTED.  The SARS-CoV-2 RNA is generally detectable in upper respiratory specimens during the acute phase of infection. The lowest concentration of SARS-CoV-2 viral copies this assay can detect is 138 copies/mL. A negative result does not preclude SARS-Cov-2 infection and should not be used as the sole basis for treatment or other patient management decisions. A negative result may occur with  improper specimen collection/handling, submission of specimen  other than nasopharyngeal swab, presence of viral mutation(s) within the areas targeted by this assay, and inadequate number of viral copies(<138 copies/mL). A negative result must be combined with clinical observations, patient history, and epidemiological information. The expected result is Negative.  Fact Sheet for Patients:  BloggerCourse.com  Fact Sheet for Healthcare Providers:  SeriousBroker.it  This test is no t yet approved or cleared by the [place] FDA and  has been authorized for detection and/or diagnosis of SARS-CoV-2 by FDA under an Emergency Use Authorization (EUA). This EUA will remain  in effect (meaning this test can be used) for the duration of the COVID-19 declaration under Section 564(b)(1) of the Act, 21 U.S.C.section 360bbb-3(b)(1), unless the authorization is terminated  or revoked sooner.       Influenza A by PCR NEGATIVE NEGATIVE Final   Influenza B by PCR NEGATIVE NEGATIVE Final    Comment: (NOTE) The Xpert Xpress SARS-CoV-2/FLU/RSV plus assay is intended as an aid in the diagnosis of influenza from Nasopharyngeal swab specimens and should not be used as a sole basis for treatment. Nasal washings and aspirates are unacceptable for Xpert Xpress SARS-CoV-2/FLU/RSV testing.  Fact Sheet for Patients: BloggerCourse.com  Fact Sheet for Healthcare Providers: SeriousBroker.it  This test is not yet approved or cleared by the [place] FDA and has been authorized for detection and/or diagnosis of SARS-CoV-2 by FDA under an Emergency Use Authorization (EUA). This EUA will remain in effect (meaning this test can be used) for the duration of the COVID-19 declaration under Section 564(b)(1) of the Act, 21 U.S.C. section 360bbb-3(b)(1), unless the authorization is terminated or revoked.  Performed at [facility], [address] [place],  [place] [address]   Gastrointestinal Panel by PCR , Stool     Status: None   Collection Time: 05/27/2020  7:33 PM   Specimen: STOOL  Result Value Ref Range Status   Campylobacter species NOT DETECTED NOT DETECTED Final   Plesimonas shigelloides NOT DETECTED NOT DETECTED Final   Salmonella species NOT DETECTED NOT DETECTED Final   Yersinia enterocolitica NOT DETECTED NOT DETECTED Final   Vibrio species NOT DETECTED NOT DETECTED Final   Vibrio cholerae NOT DETECTED NOT DETECTED Final   Enteroaggregative E coli (EAEC) NOT DETECTED NOT DETECTED Final   Enteropathogenic E coli (EPEC) NOT DETECTED NOT DETECTED Final   Enterotoxigenic E coli (ETEC) NOT DETECTED NOT DETECTED Final   Shiga like toxin producing E coli (STEC) NOT  DETECTED NOT DETECTED Final   Shigella/Enteroinvasive E coli (EIEC) NOT DETECTED NOT DETECTED Final   Cryptosporidium NOT DETECTED NOT DETECTED Final   Cyclospora cayetanensis NOT DETECTED NOT DETECTED Final   Entamoeba histolytica NOT DETECTED NOT DETECTED Final   Giardia lamblia NOT DETECTED NOT DETECTED Final   Adenovirus F40/41 NOT DETECTED NOT DETECTED Final   Astrovirus NOT DETECTED NOT DETECTED Final   Norovirus GI/GII NOT DETECTED NOT DETECTED Final   Rotavirus A NOT DETECTED NOT DETECTED Final   Sapovirus (I, II, IV, and V) NOT DETECTED NOT DETECTED Final    Comment: Performed at [facility], [address] [place], [place] [address]  C Difficile Quick Screen w PCR reflex     Status: Abnormal   Collection Time: 05/27/2020  7:39 PM   Specimen: STOOL  Result Value Ref Range Status   C Diff antigen POSITIVE (A) NEGATIVE Final   C Diff toxin NEGATIVE NEGATIVE Final   C Diff interpretation Results are indeterminate. See PCR results.  Final    Comment: Performed at [facility], [address], [place], [place] [address]  C. Diff by PCR, Reflexed     Status: Abnormal   Collection Time: 05/25/2020  7:39 PM  Result Value Ref Range Status   Toxigenic C.  Difficile by PCR POSITIVE (A) NEGATIVE Final    Comment: Positive for toxigenic C. difficile with little to no toxin production. Only treat if clinical presentation suggests symptomatic illness. Performed at [facility], [address], [place], [place] [address]   Culture, blood (routine x 2)     Status: None   Collection Time: 06/05/2020  9:43 PM   Specimen: BLOOD LEFT ARM  Result Value Ref Range Status   Specimen Description BLOOD LEFT ARM  Final   Special Requests   Final    BOTTLES DRAWN AEROBIC AND ANAEROBIC Blood Culture results may not be optimal due to an inadequate volume of blood received in culture bottles   Culture   Final    NO GROWTH 5 DAYS Performed at [facility], [address] [place], [place] [address]    Report Status 05/25/2020 FINAL  Final  Culture, blood (routine x 2)     Status: None   Collection Time: 06/02/2020  9:45 PM   Specimen: BLOOD  Result Value Ref Range Status   Specimen Description BLOOD SITE NOT SPECIFIED  Final   Special Requests   Final    BOTTLES DRAWN AEROBIC AND ANAEROBIC Blood Culture results may not be optimal due to an inadequate volume of blood received in culture bottles   Culture   Final    NO GROWTH 5 DAYS Performed at [facility], [address], [place], [place] [address]    Report Status 05/25/2020 FINAL  Final  Culture, Urine     Status: Abnormal   Collection Time: 05/21/20 12:36 AM   Specimen: Urine, Clean Catch  Result Value Ref Range Status   Specimen Description URINE, CLEAN CATCH  Final   Special Requests   Final    NONE Performed at [facility], [address], [place], [place] [address]    Culture >=100,000 COLONIES/mL ESCHERICHIA COLI (A)  Final   Report Status 05/23/2020 FINAL  Final   Organism ID, Bacteria ESCHERICHIA COLI (A)  Final      Susceptibility   Escherichia coli - MIC*    AMPICILLIN 16 INTERMEDIATE Intermediate     CEFAZOLIN <=4  SENSITIVE Sensitive     CEFEPIME <=0.12  SENSITIVE Sensitive     CEFTRIAXONE <=0.25 SENSITIVE Sensitive     CIPROFLOXACIN <=0.25 SENSITIVE Sensitive     GENTAMICIN <=1 SENSITIVE Sensitive     IMIPENEM <=0.25 SENSITIVE Sensitive     NITROFURANTOIN <=16 SENSITIVE Sensitive     TRIMETH/SULFA <=20 SENSITIVE Sensitive     AMPICILLIN/SULBACTAM 16 INTERMEDIATE Intermediate     PIP/TAZO 16 SENSITIVE Sensitive     * >=100,000 COLONIES/mL ESCHERICHIA COLI      Radiology Studies: DG CHEST PORT 1 VIEW  Result Date: 05/28/2020 CLINICAL DATA:  Respiratory distress.  Evaluate pleural effusions. EXAM: PORTABLE CHEST 1 VIEW COMPARISON:  06/05/2020 FINDINGS: Heart size and mediastinal contours are stable. Lung volumes are low. No pleural effusion or interstitial edema identified. Platelike atelectasis noted in the left lower lung. IMPRESSION: 1. Low lung volumes. 2. Platelike atelectasis in the left lower lung. Electronically Signed   By: Signa Kell M.D.   On: 05/28/2020 09:50    Scheduled Meds: . aspirin  81 mg Oral Daily  . atorvastatin  40 mg Oral Daily  . [START ON 05/30/2020] cyanocobalamin  1,000 mcg Intramuscular Weekly  . enoxaparin (LOVENOX) injection  40 mg Subcutaneous Q24H  . mouth rinse  15 mL Mouth Rinse BID  . OLANZapine  5 mg Oral QHS  . ticagrelor  90 mg Oral BID  . vancomycin  125 mg Oral QID   Continuous Infusions:    LOS: 8 days   Time spent: 35 minutes.  Tyrone Nine, MD [facility] [email or web address] 05/29/2020, 11:01 AM

## 2020-05-30 DIAGNOSIS — A0472 Enterocolitis due to Clostridium difficile, not specified as recurrent: Secondary | ICD-10-CM | POA: Diagnosis not present

## 2020-05-30 DIAGNOSIS — J9601 Acute respiratory failure with hypoxia: Secondary | ICD-10-CM | POA: Diagnosis not present

## 2020-05-30 DIAGNOSIS — Z8673 Personal history of transient ischemic attack (TIA), and cerebral infarction without residual deficits: Secondary | ICD-10-CM | POA: Diagnosis not present

## 2020-05-30 DIAGNOSIS — K529 Noninfective gastroenteritis and colitis, unspecified: Secondary | ICD-10-CM | POA: Diagnosis not present

## 2020-05-30 LAB — CBC
HCT: 37.2 % — ABNORMAL LOW (ref 39.0–52.0)
Hemoglobin: 13 g/dL (ref 13.0–17.0)
MCH: 33.2 pg (ref 26.0–34.0)
MCHC: 34.9 g/dL (ref 30.0–36.0)
MCV: 94.9 fL (ref 80.0–100.0)
Platelets: 147 10*3/uL — ABNORMAL LOW (ref 150–400)
RBC: 3.92 MIL/uL — ABNORMAL LOW (ref 4.22–5.81)
RDW: 14.2 % (ref 11.5–15.5)
WBC: 18.4 10*3/uL — ABNORMAL HIGH (ref 4.0–10.5)
nRBC: 0.3 % — ABNORMAL HIGH (ref 0.0–0.2)

## 2020-05-30 LAB — BASIC METABOLIC PANEL
Anion gap: 9 (ref 5–15)
BUN: 16 mg/dL (ref 8–23)
CO2: 28 mmol/L (ref 22–32)
Calcium: 9 mg/dL (ref 8.9–10.3)
Chloride: 103 mmol/L (ref 98–111)
Creatinine, Ser: 0.92 mg/dL (ref 0.61–1.24)
GFR, Estimated: >60 mL/min (ref >60)
Glucose, Bld: 79 mg/dL (ref 70–99)
Potassium: 3.3 mmol/L — ABNORMAL LOW (ref 3.5–5.1)
Sodium: 140 mmol/L (ref 135–145)

## 2020-05-30 MED ORDER — POTASSIUM CHLORIDE 10 MEQ/100ML IV SOLN
10.0000 meq | INTRAVENOUS | Status: AC
  Administered 2020-05-30 (×4): 10 meq via INTRAVENOUS
  Filled 2020-05-30 (×4): qty 100

## 2020-05-30 NOTE — Progress Notes (Signed)
Occupational Therapy Treatment Patient Details Name: Michael Wells MRN: [id number] DOB: 25-Sep-1940 Today's Date: 05/30/2020    History of present illness Javien Tesch is a 80 year old male admitted 06/01/2020 with complaints of diarrhea and weakness. Pt was found to have significant colitis and Cdiff. 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   OT comments  Patient not progressing toward goals this date with session limited secondary to lethargy/fatigue, pain and confusion. Patient kept eyes closed for most of session despite cueing, changes to environment, and change in patient position with patient initially more alert when bed positioned in chair position. After several attempts to initiate functional activity in position stated above, patient again closed eyes. Patient continues to require Total A grossly for self-care tasks including grooming, self-feeding, toileting and bathing. Patient would benefit from continued acute OT services to maximize safety and independence with self-care tasks and decrease caregiver burden. Recommendation for SNF rehab remains appropriate.     Follow Up Recommendations  SNF    Equipment Recommendations  Other (comment) (Defer to next level of care)    Recommendations for Other Services      Precautions / Restrictions Precautions Precautions: Fall Restrictions Weight Bearing Restrictions: No       Mobility Bed Mobility Overal bed mobility: Needs Assistance             General bed mobility comments: Total A +2 for supine scoot toward HOB. Patient does not attempt to assist. Unable to progress beyond bed level for patient/therapist safety.    Transfers Overall transfer level: Needs assistance               General transfer comment: Deferred for patient/therapist safety.    Balance                                            ADL either performed or assessed with clinical judgement   ADL Overall ADL's : Needs assistance/impaired Eating/Feeding: Bed level;Maximal assistance Eating/Feeding Details (indicate cue type and reason): Patient able to grasp cup and bring to mouth with more than increased time. Unable to manipulate utensils. Patient very lethargic. Grooming: Total assistance;Bed level Grooming Details (indicate cue type and reason): Total A to wash face with bed in chair position. Cues for sequencing and initiation.                               General ADL Comments: Patient with eyes closed most of session despite changes in enviroment and change in position. Patient confused with noted moaning and facial grimacing at rest. RN aware.     Vision       Perception     Praxis      Cognition Arousal/Alertness: Lethargic (Unchanged throughout session despite change in position.) Behavior During Therapy: Flat affect Overall Cognitive Status: Impaired/Different from baseline Area of Impairment: Orientation;Attention;Memory;Following commands;Safety/judgement;Problem solving                 Orientation Level: Time;Situation Current Attention Level: Sustained Memory: Decreased short-term memory Following Commands: Follows one step commands with increased time Safety/Judgement: Decreased awareness of safety   Problem Solving: Slow processing;Decreased initiation;Requires verbal cues;Requires tactile cues General Comments: Patient with eyes closed most of session. Patient remains  confused stating that he's at the doctors office. Difficulty following 1-step verbal commands.        Exercises     Shoulder Instructions       General Comments      Pertinent Vitals/ Pain       Pain Assessment: Faces Faces Pain Scale: Hurts little more Pain Location: generalized Pain Descriptors / Indicators: Grimacing;Moaning Pain  Intervention(s): Limited activity within patient's tolerance;Monitored during session;Repositioned  Home Living                                          Prior Functioning/Environment              Frequency  Min 2X/week        Progress Toward Goals  OT Goals(current goals can now be found in the care plan section)  Progress towards OT goals: Not progressing toward goals - comment (Session limited 2/2 fatigue/lethargy and pain.)  Acute Rehab OT Goals Patient Stated Goal: unable to state OT Goal Formulation: Patient unable to participate in goal setting Time For Goal Achievement: 06/06/20 Potential to Achieve Goals: Fair ADL Goals Pt Will Perform Grooming: with set-up;sitting Pt Will Perform Upper Body Dressing: with set-up;sitting Pt Will Perform Lower Body Dressing: with mod assist;sit to/from stand Pt Will Transfer to Toilet: with mod assist;bedside commode;stand pivot transfer Pt Will Perform Toileting - Clothing Manipulation and hygiene: with mod assist;sit to/from stand Additional ADL Goal #1: Pt will perform bed mobility prior to engaging in ADL at min A  Plan Discharge plan remains appropriate;Frequency remains appropriate    Co-evaluation                 AM-PAC OT "6 Clicks" Daily Activity     Outcome Measure   Help from another person eating meals?: Total Help from another person taking care of personal grooming?: Total Help from another person toileting, which includes using toliet, bedpan, or urinal?: Total Help from another person bathing (including washing, rinsing, drying)?: Total Help from another person to put on and taking off regular upper body clothing?: Total Help from another person to put on and taking off regular lower body clothing?: Total 6 Click Score: 6    End of Session    OT Visit Diagnosis: Unsteadiness on feet (R26.81);Other abnormalities of gait and mobility (R26.89);Repeated falls (R29.6);Muscle weakness  (generalized) (M62.81);History of falling (Z91.81);Adult, failure to thrive (R62.7)   Activity Tolerance Other (comment) (Limited by confusion, pain and lethargy/fatigue)   Patient Left in bed;with call bell/phone within reach;with bed alarm set   Nurse Communication          Time: 0034-9179 OT Time Calculation (min): 18 min  Charges: OT General Charges $OT Visit: 1 Visit OT Treatments $Self Care/Home Management : 8-22 mins  Braylen Denunzio H. OTR/L Supplemental OT, Department of rehab services [phone]   Rodgerick Gilliand R H. 05/30/2020, 12:23 PM

## 2020-05-30 NOTE — Progress Notes (Signed)
Secure chat sent including both Dr. Jarvis Newcomer and nurse Maralyn Sago RN. This patient has pulled out app. 7-10 PIV since admission. At this time. No infusions are order. Recommended re-consulting when infusions ordered and having a better plan in place to reduce risk of patient removing PIV access. Tomasita Morrow, RN VAST

## 2020-05-30 NOTE — Progress Notes (Signed)
PROGRESS NOTE  Michael Wells  ZOX:[id number] DOB: 08-17-1940 DOA: 2020/05/30 PCP: Marva Panda, NP  Brief Narrative: Michael Wells is a 80 y.o. 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 diff testing. Urine culture also grew E. coli. The patient was admitted, started on IVF and po vancomycin with modest improvement in symptoms, though weakness remains significant for which SNF is recommended. Hospitalization complicated by agitated delirium, intermittent hypoxia, and electrolyte derangements.  Assessment & Plan: Principal Problem:   Acute colitis Active Problems:   Sepsis (HCC)   History of embolic stroke   Skin ulcer of buttock, limited to breakdown of skin (HCC)   Hyponatremia   Acute metabolic encephalopathy   Acute respiratory failure with hypoxia (HCC)   Hyperglycemia   Clostridium difficile colitis  Severe sepsis due to C. difficile colitis:  - Pt improving on po vancomycin  QID dosing, abd exam benign, so will not increase dose at this time. Will extend 10 day course to 14 days.  - Persistent leukocytosis, though patient is afebrile and overall improved 34 >> 18. Stool characteristics becoming less watery. Abd benign.  - Contact precautions. - Monitor blood cultures (NGTD).  Acute hypoxic respiratory failure due to atelectasis: CXR without infiltrate or pulmonary edema, +atelectasis.  - Wean oxygen as tolerated - Encourage use of IS. D/w family, pt, and RN at bedside this AM.  - D-dimer elevated and L > RLE swelling. V/Q scan showed no perfusion defects. LE U/S for DVT eval is pending. - Aspiration precautions. ?if this and persistent leukocytosis is due to silent aspiration.  Acute metabolic encephalopathy: Suspect possible underlying vascular dementia/diminished cerebral reserve leading to agitated hospital delirium.  Dehydration and severe sepsis also causative. TSH 0.995, folic acid wnl.  - Delirium precautions  - DC melatonin and qHS zyprexa in hopes of making him more alert.    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  - Continue IM supplementation.   E. coli UTI: Unclear reliability regarding symptoms.  - Tx with keflex   - Blood cultures negative to date.  Ascending aortic aneurysm: Incidentally noted on CTA, 4.2cm.  - CTA or MRA recommended semiannually.   Prediabetes: HbA1c 5.8%.   Hypokalemia: Improved with supplementation, down a bit after stopping IVF - Repeat supplementation w/repeated loop diuretic today.    Hyperbilirubinemia: Improving. RUQ U/S with cholelithiasis without biliary dilatation or evidence of inflammation.  Hyponatremia: Stable  DVT prophylaxis: Lovenox Code Status: Full Family Communication: Wife at bedside Disposition Plan:  Status is: Inpatient  Remains inpatient appropriate because:Altered mental status and IV treatments appropriate due to intensity of illness or inability to take PO. Patient is making limited progress toward rehabilitation and improvement from acute illness. SNF remains an appropriate attempted avenue at discharge, though I've discussed several times with the patient's wife the increasingly likely outcome may be no significant functional improvement, in which case hospice care would be recommended. She understands but is perseverantly optimistic with strong religious faith in his improvement. Would NOT wish to discuss this option with palliative care.  Dispo: The patient is from: Home  Anticipated d/c is to: SNF likely 5/18.               Patient currently is not medically stable to d/c.    Difficult to place patient No  Consultants:    None  Procedures:   None  Antimicrobials:  Oral vancomycin  Ceftriaxone 5/11 - 5/13   Subjective: Pt intermittently more alert. While I was in there, he was speaking though kept his eyes closed. Ate some grits with prompting.   Objective: Vitals:   05/29/20 1333 05/29/20 2242 05/30/20 0700 05/30/20 1353  BP: 108/79 (!) 143/85 109/80 125/63  Pulse: 97 98 97 85  Resp: 17 19 18    Temp: 97.8 F (36.6 C) 98.2 F (36.8 C) (!) 97.5 F (36.4 C) 98.7 F (37.1 C)  TempSrc: Oral  Oral Axillary  SpO2: 90% 92% 94% 99%  Weight:      Height:        Intake/Output Summary (Last 24 hours) at 05/30/2020 1539 Last data filed at 05/29/2020 1700 Gross per 24 hour  Intake --  Output 750 ml  Net -750 ml   Filed Weights   06/04/2020 1353  Weight: 77.1 kg   Gen: 80 y.o. male in no distress Pulm: Nonlabored breathing room air this AM. Clear anterolaterally. CV: Regular rate and rhythm. No murmur, rub, or gallop. No JVD. GI: Abdomen soft, non-tender, non-distended, with normoactive bowel sounds.  Ext: Warm, no deformities. LLE 2+ pitting edema, RLE 1+ pitting edema. Skin: Cool, dry without new rashes, lesions or ulcers on visualized skin. Neuro: Keeps eyes closed and will open and track for short periods with appropriate mentation at that time, though quickly back to decreased responsiveness. Moves all extremities but not otherwise cooperative with exam.  Psych: Judgement and insight appear marginal/impaired.      Data Reviewed: I have personally reviewed following labs and imaging studies  CBC: Recent Labs  Lab 05/24/20 0230 05/26/20 0058 05/28/20 0342 05/29/20 0350 05/30/20 0729  WBC 15.3* 14.8* 21.6* 19.5* 18.4*  NEUTROABS  --   --   --  17.5*  --   HGB 12.8* 12.3* 14.5 13.9 13.0  HCT 37.3* 36.7* 41.9 40.2 37.2*  MCV 95.6 96.1 95.4 95.3 94.9  PLT 165 143* 151 168 147*   Basic Metabolic Panel: Recent Labs  Lab 05/25/20 1044 05/26/20 0058 05/27/20 0033 05/28/20 0342  05/29/20 0350 05/30/20 0729  NA 135 135 135 137 137 140  K 3.4* 3.4* 3.9 3.5 3.5 3.3*  CL 104 106 105 105 106 103  CO2 25 25 23 23 23 28   GLUCOSE 106* 112* 120* 112* 91 79  BUN 8 7* 7* 11 13 16   CREATININE 0.73 0.74 0.65 0.72 0.82 0.92  CALCIUM 8.9 9.0 9.0 9.1 9.3 9.0  MG 1.6*  --  1.8  --  1.7  --    GFR: Estimated Creatinine Clearance: 61.9 mL/min (by C-G formula based on SCr of 0.92 mg/dL). Liver Function Tests: Recent Labs  Lab 05/24/20 0230  AST 27  ALT 21  ALKPHOS 81  BILITOT 1.8*  PROT 4.7*  ALBUMIN 1.9*   No results for input(s): LIPASE, AMYLASE in the last 168 hours. No results for input(s): AMMONIA in the last 168 hours. Coagulation Profile: No results for input(s): INR, PROTIME in the last 168 hours. Cardiac Enzymes: No results for input(s): CKTOTAL, CKMB, CKMBINDEX, TROPONINI in the last 168 hours. BNP (last 3 results) No results for input(s): PROBNP in the last 8760 hours. HbA1C: No  results for input(s): HGBA1C in the last 72 hours. CBG: No results for input(s): GLUCAP in the last 168 hours. Lipid Profile: No results for input(s): CHOL, HDL, LDLCALC, TRIG, CHOLHDL, LDLDIRECT in the last 72 hours. Thyroid Function Tests: No results for input(s): TSH, T4TOTAL, FREET4, T3FREE, THYROIDAB in the last 72 hours. Anemia Panel: No results for input(s): VITAMINB12, FOLATE, FERRITIN, TIBC, IRON, RETICCTPCT in the last 72 hours. Urine analysis:    Component Value Date/Time   COLORURINE YELLOW 05/21/2020 0036   APPEARANCEUR HAZY (A) 05/21/2020 0036   LABSPEC >1.046 (H) 05/21/2020 0036   PHURINE 6.0 05/21/2020 0036   GLUCOSEU NEGATIVE 05/21/2020 0036   HGBUR NEGATIVE 05/21/2020 0036   BILIRUBINUR NEGATIVE 05/21/2020 0036   KETONESUR NEGATIVE 05/21/2020 0036   PROTEINUR NEGATIVE 05/21/2020 0036   NITRITE POSITIVE (A) 05/21/2020 0036   LEUKOCYTESUR LARGE (A) 05/21/2020 0036   Recent Results (from the past 240 hour(s))  Gastrointestinal Panel by PCR , Stool      Status: None   Collection Time: 2020/08/29  7:33 PM   Specimen: STOOL  Result Value Ref Range Status   Campylobacter species NOT DETECTED NOT DETECTED Final   Plesimonas shigelloides NOT DETECTED NOT DETECTED Final   Salmonella species NOT DETECTED NOT DETECTED Final   Yersinia enterocolitica NOT DETECTED NOT DETECTED Final   Vibrio species NOT DETECTED NOT DETECTED Final   Vibrio cholerae NOT DETECTED NOT DETECTED Final   Enteroaggregative E coli (EAEC) NOT DETECTED NOT DETECTED Final   Enteropathogenic E coli (EPEC) NOT DETECTED NOT DETECTED Final   Enterotoxigenic E coli (ETEC) NOT DETECTED NOT DETECTED Final   Shiga like toxin producing E coli (STEC) NOT DETECTED NOT DETECTED Final   Shigella/Enteroinvasive E coli (EIEC) NOT DETECTED NOT DETECTED Final   Cryptosporidium NOT DETECTED NOT DETECTED Final   Cyclospora cayetanensis NOT DETECTED NOT DETECTED Final   Entamoeba histolytica NOT DETECTED NOT DETECTED Final   Giardia lamblia NOT DETECTED NOT DETECTED Final   Adenovirus F40/41 NOT DETECTED NOT DETECTED Final   Astrovirus NOT DETECTED NOT DETECTED Final   Norovirus GI/GII NOT DETECTED NOT DETECTED Final   Rotavirus A NOT DETECTED NOT DETECTED Final   Sapovirus (I, II, IV, and V) NOT DETECTED NOT DETECTED Final    Comment: Performed at [facility], [address] [place], [place] [address]  C Difficile Quick Screen w PCR reflex     Status: Abnormal   Collection Time: 2020/08/29  7:39 PM   Specimen: STOOL  Result Value Ref Range Status   C Diff antigen POSITIVE (A) NEGATIVE Final   C Diff toxin NEGATIVE NEGATIVE Final   C Diff interpretation Results are indeterminate. See PCR results.  Final    Comment: Performed at [facility], [address] [place], [place] [address]  C. Diff by PCR, Reflexed     Status: Abnormal   Collection Time: 2020/08/29  7:39 PM  Result Value Ref Range Status   Toxigenic C. Difficile by PCR POSITIVE (A) NEGATIVE Final    Comment:  Positive for toxigenic C. difficile with little to no toxin production. Only treat if clinical presentation suggests symptomatic illness. Performed at [facility], [address] [place], [place] [address]   Culture, blood (routine x 2)     Status: None   Collection Time: 2020/08/29  9:43 PM   Specimen: BLOOD LEFT ARM  Result Value Ref Range Status   Specimen Description BLOOD LEFT ARM  Final   Special Requests  Final    BOTTLES DRAWN AEROBIC AND ANAEROBIC Blood Culture results may not be optimal due to an inadequate volume of blood received in culture bottles   Culture   Final    NO GROWTH 5 DAYS Performed at [facility], [address], [place], [place] [address]    Report Status 05/25/2020 FINAL  Final  Culture, blood (routine x 2)     Status: None   Collection Time: 06/07/2020  9:45 PM   Specimen: BLOOD  Result Value Ref Range Status   Specimen Description BLOOD SITE NOT SPECIFIED  Final   Special Requests   Final    BOTTLES DRAWN AEROBIC AND ANAEROBIC Blood Culture results may not be optimal due to an inadequate volume of blood received in culture bottles   Culture   Final    NO GROWTH 5 DAYS Performed at [facility], [address], [place], [place] [address]    Report Status 05/25/2020 FINAL  Final  Culture, Urine     Status: Abnormal   Collection Time: 05/21/20 12:36 AM   Specimen: Urine, Clean Catch  Result Value Ref Range Status   Specimen Description URINE, CLEAN CATCH  Final   Special Requests   Final    NONE Performed at [facility], [address] [place], [place] [address]    Culture >=100,000 COLONIES/mL ESCHERICHIA COLI (A)  Final   Report Status 05/23/2020 FINAL  Final   Organism ID, Bacteria ESCHERICHIA COLI (A)  Final      Susceptibility   Escherichia coli - MIC*    AMPICILLIN 16 INTERMEDIATE Intermediate     CEFAZOLIN <=4 SENSITIVE Sensitive     CEFEPIME <=0.12 SENSITIVE Sensitive     CEFTRIAXONE <=0.25 SENSITIVE Sensitive      CIPROFLOXACIN <=0.25 SENSITIVE Sensitive     GENTAMICIN <=1 SENSITIVE Sensitive     IMIPENEM <=0.25 SENSITIVE Sensitive     NITROFURANTOIN <=16 SENSITIVE Sensitive     TRIMETH/SULFA <=20 SENSITIVE Sensitive     AMPICILLIN/SULBACTAM 16 INTERMEDIATE Intermediate     PIP/TAZO 16 SENSITIVE Sensitive     * >=100,000 COLONIES/mL ESCHERICHIA COLI      Radiology Studies: NM Pulmonary Perfusion  Result Date: 05/29/2020 CLINICAL DATA:  Positive D-dimer and chest pain, initial encounter EXAM: NUCLEAR MEDICINE PERFUSION LUNG SCAN TECHNIQUE: Perfusion images were obtained in multiple projections after intravenous injection of radiopharmaceutical. Ventilation scans intentionally deferred if perfusion scan and chest x-ray adequate for interpretation during COVID 19 epidemic. RADIOPHARMACEUTICALS:  4.4 mCi Tc-57m MAA IV COMPARISON:  Chest x-ray from earlier in the same day. FINDINGS: Adequate uptake of radiotracer is noted throughout both lungs. No which shaped defect is identified to suggest pulmonary embolism. Large defect from the cardiac shadow and tortuous thoracic aorta is noted. IMPRESSION: No findings to suggest pulmonary embolism. Electronically Signed   By: Alcide Clever M.D.   On: 05/29/2020 20:38   DG CHEST PORT 1 VIEW  Result Date: 05/29/2020 CLINICAL DATA:  Shortness of breath EXAM: PORTABLE CHEST 1 VIEW COMPARISON:  Film from the previous day. FINDINGS: Cardiac shadow remains enlarged. Tortuous thoracic aorta is again seen. Mild atelectatic changes are again seen in the left base. No effusion is noted. No pneumothorax is seen. No bony abnormality is noted. IMPRESSION: Stable left basilar atelectasis. Electronically Signed   By: Alcide Clever M.D.   On: 05/29/2020 17:55    Scheduled Meds: . aspirin  81 mg Oral Daily  . atorvastatin  40 mg Oral  Daily  . cyanocobalamin  1,000 mcg Intramuscular Weekly  . enoxaparin (LOVENOX) injection  40 mg Subcutaneous Q24H  . mouth rinse  15 mL Mouth Rinse  BID  . ticagrelor  90 mg Oral BID  . vancomycin  125 mg Oral QID   Continuous Infusions: . potassium chloride 10 mEq (05/30/20 1537)     LOS: 9 days   Time spent: 35 minutes.  Tyrone Nine, MD [facility] [email or web address] 05/30/2020, 3:39 PM

## 2020-05-31 ENCOUNTER — Inpatient Hospital Stay (HOSPITAL_COMMUNITY): Payer: Medicare (Managed Care)

## 2020-05-31 DIAGNOSIS — K529 Noninfective gastroenteritis and colitis, unspecified: Secondary | ICD-10-CM | POA: Diagnosis not present

## 2020-05-31 DIAGNOSIS — R7989 Other specified abnormal findings of blood chemistry: Secondary | ICD-10-CM | POA: Diagnosis not present

## 2020-05-31 LAB — BLOOD GAS, ARTERIAL
Acid-Base Excess: 2.1 mmol/L — ABNORMAL HIGH (ref 0.0–2.0)
Bicarbonate: 26.4 mmol/L (ref 20.0–28.0)
Drawn by: 59156
FIO2: 28
O2 Saturation: 93.7 %
Patient temperature: 36.5
pCO2 arterial: 42 mmHg (ref 32.0–48.0)
pH, Arterial: 7.412 (ref 7.350–7.450)
pO2, Arterial: 73.8 mmHg — ABNORMAL LOW (ref 83.0–108.0)

## 2020-05-31 LAB — AMMONIA: Ammonia: 38 umol/L — ABNORMAL HIGH (ref 9–35)

## 2020-05-31 LAB — GLUCOSE, CAPILLARY
Glucose-Capillary: 109 mg/dL — ABNORMAL HIGH (ref 70–99)
Glucose-Capillary: 114 mg/dL — ABNORMAL HIGH (ref 70–99)
Glucose-Capillary: 59 mg/dL — ABNORMAL LOW (ref 70–99)
Glucose-Capillary: 66 mg/dL — ABNORMAL LOW (ref 70–99)
Glucose-Capillary: 81 mg/dL (ref 70–99)
Glucose-Capillary: 96 mg/dL (ref 70–99)
Glucose-Capillary: 97 mg/dL (ref 70–99)

## 2020-05-31 MED ORDER — DEXTROSE 50 % IV SOLN
INTRAVENOUS | Status: AC
  Administered 2020-05-31: 50 mL via INTRAVENOUS
  Filled 2020-05-31: qty 50

## 2020-05-31 MED ORDER — DEXTROSE 50 % IV SOLN
INTRAVENOUS | Status: AC
  Administered 2020-05-31: 50 mL
  Filled 2020-05-31: qty 50

## 2020-05-31 MED ORDER — MAGNESIUM SULFATE 2 GM/50ML IV SOLN
2.0000 g | Freq: Once | INTRAVENOUS | Status: AC
  Administered 2020-05-31: 2 g via INTRAVENOUS
  Filled 2020-05-31: qty 50

## 2020-05-31 NOTE — Progress Notes (Signed)
PROGRESS NOTE  Michael Wells  ZCH:[id number] DOB: Feb 09, 1940 DOA: 06/04/2020 PCP: Marva Panda, NP     Brief Narrative: Michael Wells is a 80 y.o. 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 diff testing. Urine culture also grew E. coli. The patient was admitted, started on IVF and po vancomycin with modest improvement in symptoms, though weakness remains significant for which SNF is recommended. Hospitalization complicated by agitated delirium, intermittent hypoxia, and electrolyte derangements.  Assessment & Plan: Principal Problem:   Acute colitis Active Problems:   Sepsis (HCC)   History of embolic stroke   Skin ulcer of buttock, limited to breakdown of skin (HCC)   Hyponatremia   Acute metabolic encephalopathy   Acute respiratory failure with hypoxia (HCC)   Hyperglycemia   Clostridium difficile colitis   Severe sepsis due to C. difficile colitis:  -  on po vancomycin 125mg  QID dosing, abd exam benign,  course of 14 days.  - Persistent leukocytosis, though patient is afebrile-- WBC 34 >> 18. Stool characteristics becoming less watery. Abd benign.  - Contact precautions. - blood cultures (NGTD).  Acute hypoxic respiratory failure due to atelectasis: CXR without infiltrate or pulmonary edema, +atelectasis.  - Wean oxygen as tolerated - Encourage use of IS- when patient able  - D-dimer elevated and L > RLE swelling. V/Q scan showed no perfusion defects. LE U/S for DVT eval is pending. - Aspiration precautions. ?if this and persistent leukocytosis is due to silent aspiration-- SLP eval  Acute metabolic encephalopathy: Suspect possible underlying vascular dementia/diminished cerebral reserve leading to agitated hospital delirium.  -?CVA: CT head pending -ABG -EEG -monitor blood sugar  TSH 0.995, folic acid wnl.  - Delirium  precautions  - DC melatonin and qHS zyprexa in hopes of making him more alert.    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  - Continue IM supplementation.   E. coli UTI: Unclear reliability regarding symptoms.  - Tx'd with keflex   - Blood cultures negative to date.  Ascending aortic aneurysm: Incidentally noted on CTA, 4.2cm.  - CTA or MRA recommended semiannually.   Prediabetes: HbA1c 5.8%.   Hypokalemia: Improved with supplementation, down a bit after stopping IVF - Repeat supplementation w/repeated loop diuretic today.    Hyperbilirubinemia: Improving. RUQ U/S with cholelithiasis without biliary dilatation or evidence of inflammation.  Hyponatremia: Stable  DVT prophylaxis: Lovenox Code Status: Full Family Communication: spoke with son on phone Disposition Plan:  Status is: Inpatient  Remains inpatient appropriate because:Altered mental status and IV treatments appropriate due to intensity of illness or inability to take PO. Will get palliative care consult Dispo: The patient is from: Home              Anticipated d/c is to: SNF  When able to participate              Patient currently is not medically stable to d/c.    Difficult to place patient No  Consultants:   Palliative care- poor overall prognosis  Antimicrobials:  Oral vancomycin  Ceftriaxone 5/11 - 5/13   Subjective: Pt intermittently more alert. While I was in there, he was speaking though  kept his eyes closed. Ate some grits with prompting.   Objective: Vitals:   05/30/20 0700 05/30/20 1353 05/30/20 2020 05/31/20 1127  BP: 109/80 125/63 (!) 131/114 (!) 115/51  Pulse: 97 85 95 81  Resp: 18   16  Temp: (!) 97.5 F (36.4 C) 98.7 F (37.1 C) 97.6 F (36.4 C) 97.6 F (36.4 C)  TempSrc: Oral Axillary Oral  Axillary  SpO2: 94% 99% 98% 96%  Weight:      Height:       No intake or output data in the 24 hours ending 05/31/20 1450 Filed Weights   2020-06-08 1353  Weight: 77.1 kg    General: Appearance:     Ill appearing male in no acute distress     Lungs:     respirations labored mildly, diminished sounds, 3L [place] 97%  Heart:    Normal heart rate.   MS:   +LE edema  Neurologic:  sleeping, not following commands     Data Reviewed: I have personally reviewed following labs and imaging studies  CBC: Recent Labs  Lab 05/26/20 0058 05/28/20 0342 05/29/20 0350 05/30/20 0729  WBC 14.8* 21.6* 19.5* 18.4*  NEUTROABS  --   --  17.5*  --   HGB 12.3* 14.5 13.9 13.0  HCT 36.7* 41.9 40.2 37.2*  MCV 96.1 95.4 95.3 94.9  PLT 143* 151 168 147*   Basic Metabolic Panel: Recent Labs  Lab 05/25/20 1044 05/26/20 0058 05/27/20 0033 05/28/20 0342 05/29/20 0350 05/30/20 0729  NA 135 135 135 137 137 140  K 3.4* 3.4* 3.9 3.5 3.5 3.3*  CL 104 106 105 105 106 103  CO2 25 25 23 23 23 28   GLUCOSE 106* 112* 120* 112* 91 79  BUN 8 7* 7* 11 13 16   CREATININE 0.73 0.74 0.65 0.72 0.82 0.92  CALCIUM 8.9 9.0 9.0 9.1 9.3 9.0  MG 1.6*  --  1.8  --  1.7  --    GFR: Estimated Creatinine Clearance: 61.9 mL/min (by C-G formula based on SCr of 0.92 mg/dL). Liver Function Tests: No results for input(s): AST, ALT, ALKPHOS, BILITOT, PROT, ALBUMIN in the last 168 hours. No results for input(s): LIPASE, AMYLASE in the last 168 hours. Recent Labs  Lab 05/31/20 1148  AMMONIA 38*   Coagulation Profile: No results for input(s): INR, PROTIME in the last 168 hours. Cardiac Enzymes: No results for input(s): CKTOTAL, CKMB, CKMBINDEX, TROPONINI in the last 168 hours. BNP (last 3 results) No results for input(s): PROBNP in the last 8760 hours. HbA1C: No results for input(s): HGBA1C in the last 72 hours. CBG: Recent Labs  Lab 05/31/20 1058 05/31/20 1147 05/31/20 1336  GLUCAP 59* 109* 81   Lipid Profile: No  results for input(s): CHOL, HDL, LDLCALC, TRIG, CHOLHDL, LDLDIRECT in the last 72 hours. Thyroid Function Tests: No results for input(s): TSH, T4TOTAL, FREET4, T3FREE, THYROIDAB in the last 72 hours. Anemia Panel: No results for input(s): VITAMINB12, FOLATE, FERRITIN, TIBC, IRON, RETICCTPCT in the last 72 hours. Urine analysis:    Component Value Date/Time   COLORURINE YELLOW 05/21/2020 0036   APPEARANCEUR HAZY (A) 05/21/2020 0036   LABSPEC >1.046 (H) 05/21/2020 0036   PHURINE 6.0 05/21/2020 0036   GLUCOSEU NEGATIVE 05/21/2020 0036   HGBUR NEGATIVE 05/21/2020 0036   BILIRUBINUR NEGATIVE 05/21/2020 0036   KETONESUR NEGATIVE 05/21/2020 0036   PROTEINUR NEGATIVE 05/21/2020 0036   NITRITE POSITIVE (A) 05/21/2020 0036   LEUKOCYTESUR LARGE (A) 05/21/2020 0036   No results found for  this or any previous visit (from the past 240 hour(s)).    Radiology Studies: NM Pulmonary Perfusion  Result Date: 05/29/2020 CLINICAL DATA:  Positive D-dimer and chest pain, initial encounter EXAM: NUCLEAR MEDICINE PERFUSION LUNG SCAN TECHNIQUE: Perfusion images were obtained in multiple projections after intravenous injection of radiopharmaceutical. Ventilation scans intentionally deferred if perfusion scan and chest x-ray adequate for interpretation during COVID 19 epidemic. RADIOPHARMACEUTICALS:  4.4 mCi Tc-11m MAA IV COMPARISON:  Chest x-ray from earlier in the same day. FINDINGS: Adequate uptake of radiotracer is noted throughout both lungs. No which shaped defect is identified to suggest pulmonary embolism. Large defect from the cardiac shadow and tortuous thoracic aorta is noted. IMPRESSION: No findings to suggest pulmonary embolism. Electronically Signed   By: Alcide Clever M.D.   On: 05/29/2020 20:38   DG CHEST PORT 1 VIEW  Result Date: 05/29/2020 CLINICAL DATA:  Shortness of breath EXAM: PORTABLE CHEST 1 VIEW COMPARISON:  Film from the previous day. FINDINGS: Cardiac shadow remains enlarged. Tortuous  thoracic aorta is again seen. Mild atelectatic changes are again seen in the left base. No effusion is noted. No pneumothorax is seen. No bony abnormality is noted. IMPRESSION: Stable left basilar atelectasis. Electronically Signed   By: Alcide Clever M.D.   On: 05/29/2020 17:55    Scheduled Meds: . aspirin  81 mg Oral Daily  . atorvastatin  40 mg Oral Daily  . cyanocobalamin  1,000 mcg Intramuscular Weekly  . enoxaparin (LOVENOX) injection  40 mg Subcutaneous Q24H  . mouth rinse  15 mL Mouth Rinse BID  . ticagrelor  90 mg Oral BID  . vancomycin  125 mg Oral QID   Continuous Infusions:    LOS: 10 days   Time spent: 35 minutes.  Joseph Art, DO [facility] [email or web address] 05/31/2020, 2:50 PM

## 2020-05-31 NOTE — Evaluation (Signed)
Clinical/Bedside Swallow Evaluation Patient Details  Name: Michael Wells MRN: [id number] Date of Birth: October 09, 1940  Today's Date: 05/31/2020 Time: SLP Start Time (ACUTE ONLY): 1508 SLP Stop Time (ACUTE ONLY): 1524 SLP Time Calculation (min) (ACUTE ONLY): 16.73 min  Past Medical History:  Past Medical History:  Diagnosis Date  . Anxiety   . Carotid artery stenosis, symptomatic, right 05/19/2020  . Chronic pain   . H/O sleep disturbance   . History of embolic stroke 06/11/2020   Past Surgical History:  Past Surgical History:  Procedure Laterality Date  . IR ANGIO VERTEBRAL SEL SUBCLAVIAN INNOMINATE UNI L MOD SED  03/10/2020  . IR INTRAVSC STENT CERV CAROTID W/EMB-PROT MOD SED INCL ANGIO  03/10/2020  . IR STENT PLACEMENT ANTE CAROTID INC ANGIO  03/10/2020      . IR US GUIDE VASC ACCESS RIGHT  03/10/2020  . RADIOLOGY WITH ANESTHESIA N/A 03/10/2020   Procedure: IR WITH ANESTHESIA;  Surgeon: Radiologist, Medication, MD;  Location: [facility] OR;  Service: Radiology;  Laterality: N/A;   HPI:  Pt is a 80 year old male admitted 05/25/2020 with complaints of diarrhea and weakness. Dx significant colitis, C. diff, metabolic enceophalopathy, sepsis. Hospitalization complicated by agitated delirium, intermittent hypoxia, and electrolyte derangements. Silent aspiration questioned by Dr. Jarvis Newcomer on 5/17 due to persistent leukocytosis. CXR 5/16: Stable left basilar atelectasis. 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   Assessment / Plan / Recommendation Clinical Impression  Pt was seen for bedside swallow evaluation with his son present. Pt's son denied the pt having a history of dysphagia and indicated that the pt consumes regular texture solids and thin liquids. Pt's level of alertness was initially adeqaute, but this waned as the evaluation progressed. Verbal output was limited and speech intelligibility reduced. Oral  mucosa was dry and dentition was adequate. Pt demonstrated impaired bolus awareness, oral holding, and signs of aspiration with ice chips. Pt exhibited delayed manipulation and mastication of ice chips, but bolus manipulation was absent with puree boluses despite verbal prompts and these were ultimately removed from the oral cavity via oral swab. It is recommended that the pt's NPO status be maintained and SLP will follow to assess improvement in swallow function. SLP Visit Diagnosis: Dysphagia, unspecified (R13.10)    Aspiration Risk  Mild aspiration risk    Diet Recommendation NPO;Alternative means - temporary   Postural Changes: Seated upright at 90 degrees    Other  Recommendations Oral Care Recommendations: Oral care BID;Oral care prior to ice chip/H20 (pt may have individual ice chips after oral care)   Follow up Recommendations  (TBD)      Frequency and Duration min 2x/week  2 weeks       Prognosis Prognosis for Safe Diet Advancement: Fair Barriers to Reach Goals: Cognitive deficits      Swallow Study   General Date of Onset: 05/31/20 HPI: Pt is a 80 year old male admitted 05/17/2020 with complaints of diarrhea and weakness. Dx significant colitis, C. diff, metabolic enceophalopathy, sepsis. Hospitalization complicated by agitated delirium, intermittent hypoxia, and electrolyte derangements. Silent aspiration questioned by Dr. Jarvis Newcomer on 5/17 due to persistent leukocytosis. CXR 5/16: Stable left basilar atelectasis. 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 Type of Study: Bedside Swallow Evaluation Previous Swallow Assessment: none Diet Prior to this Study: NPO Temperature Spikes Noted: No Respiratory Status: Nasal cannula History of Recent Intubation: No Behavior/Cognition: Alert;Confused Oral Cavity  Assessment: Dry Oral Care Completed by SLP: Yes Oral Cavity - Dentition: Adequate  natural dentition Vision: Functional for self-feeding Self-Feeding Abilities: Needs assist Patient Positioning: Upright in bed;Postural control adequate for testing Baseline Vocal Quality: Low vocal intensity Volitional Cough: Cognitively unable to elicit Volitional Swallow: Unable to elicit    Oral/Motor/Sensory Function Overall Oral Motor/Sensory Function:  (UTA)   Ice Chips Ice chips: Impaired Presentation: Spoon Oral Phase Impairments: Poor awareness of bolus Oral Phase Functional Implications: Prolonged oral transit Pharyngeal Phase Impairments: Throat Clearing - Delayed;Cough - Delayed   Thin Liquid Thin Liquid: Not tested    Nectar Thick Nectar Thick Liquid: Not tested   Honey Thick Honey Thick Liquid: Not tested   Puree Puree: Impaired Presentation: Spoon Oral Phase Impairments: Poor awareness of bolus Oral Phase Functional Implications: Oral holding   Solid     Solid: Not tested     Kirkland Figg [name], MS, CCC-SLP Acute Rehabilitation Services Office number [phone] Pager [phone]  Scheryl Marten 05/31/2020,3:38 PM

## 2020-05-31 NOTE — TOC Progression Note (Addendum)
Transition of Care [facility]) - Progression Note    Patient Details  Name: JAEDAN HUTTNER MRN: [id number] Date of Birth: 16-Jan-1940  Transition of Care [facility]) CM/SW Contact  Lorri Frederick, LCSW Phone Number: 05/31/2020, 10:35 AM  Clinical Narrative:   CSW informed by Ardean Larsen at [facility] that pt has been authorized for SNF, good through 5/23.    Expected Discharge Plan: Skilled Nursing Facility Barriers to Discharge: Continued Medical Work up,SNF Pending bed offer  Expected Discharge Plan and Services Expected Discharge Plan: Skilled Nursing Facility     Post Acute Care Choice: Skilled Nursing Facility Living arrangements for the past 2 months: Single Family Home                                       Social Determinants of Health (SDOH) Interventions    Readmission Risk Interventions No flowsheet data found.

## 2020-05-31 NOTE — Progress Notes (Signed)
Pt unavailable due to having another procedure.  Will attempt EEG later when our schedule permits.

## 2020-05-31 NOTE — Progress Notes (Signed)
Son and daughter-in-law are in room now

## 2020-05-31 NOTE — Progress Notes (Signed)
Bilateral lower extremity venous duplex completed. Refer to "CV Proc" under chart review to view preliminary results.  05/31/2020 3:28 PM Eula Fried., MHA, RVT, RDCS, RDMS

## 2020-05-31 NOTE — Progress Notes (Signed)
CBG was 59 pushed D50

## 2020-05-31 NOTE — Progress Notes (Signed)
EEG complete - results pending 

## 2020-06-01 ENCOUNTER — Inpatient Hospital Stay (HOSPITAL_COMMUNITY): Payer: Medicare (Managed Care)

## 2020-06-01 DIAGNOSIS — G9341 Metabolic encephalopathy: Secondary | ICD-10-CM | POA: Diagnosis not present

## 2020-06-01 DIAGNOSIS — J9601 Acute respiratory failure with hypoxia: Secondary | ICD-10-CM | POA: Diagnosis not present

## 2020-06-01 DIAGNOSIS — A0472 Enterocolitis due to Clostridium difficile, not specified as recurrent: Secondary | ICD-10-CM | POA: Diagnosis not present

## 2020-06-01 DIAGNOSIS — K529 Noninfective gastroenteritis and colitis, unspecified: Secondary | ICD-10-CM | POA: Diagnosis not present

## 2020-06-01 LAB — CBC
HCT: 35.4 % — ABNORMAL LOW (ref 39.0–52.0)
Hemoglobin: 11.9 g/dL — ABNORMAL LOW (ref 13.0–17.0)
MCH: 32.9 pg (ref 26.0–34.0)
MCHC: 33.6 g/dL (ref 30.0–36.0)
MCV: 97.8 fL (ref 80.0–100.0)
Platelets: 134 10*3/uL — ABNORMAL LOW (ref 150–400)
RBC: 3.62 MIL/uL — ABNORMAL LOW (ref 4.22–5.81)
RDW: 14.6 % (ref 11.5–15.5)
WBC: 20.1 10*3/uL — ABNORMAL HIGH (ref 4.0–10.5)
nRBC: 0.1 % (ref 0.0–0.2)

## 2020-06-01 LAB — COMPREHENSIVE METABOLIC PANEL
ALT: 132 U/L — ABNORMAL HIGH (ref 0–44)
AST: 99 U/L — ABNORMAL HIGH (ref 15–41)
Albumin: 1.9 g/dL — ABNORMAL LOW (ref 3.5–5.0)
Alkaline Phosphatase: 569 U/L — ABNORMAL HIGH (ref 38–126)
Anion gap: 6 (ref 5–15)
BUN: 13 mg/dL (ref 8–23)
CO2: 28 mmol/L (ref 22–32)
Calcium: 9.3 mg/dL (ref 8.9–10.3)
Chloride: 106 mmol/L (ref 98–111)
Creatinine, Ser: 0.78 mg/dL (ref 0.61–1.24)
GFR, Estimated: >60 mL/min (ref >60)
Glucose, Bld: 107 mg/dL — ABNORMAL HIGH (ref 70–99)
Potassium: 2.8 mmol/L — ABNORMAL LOW (ref 3.5–5.1)
Sodium: 140 mmol/L (ref 135–145)
Total Bilirubin: 3 mg/dL — ABNORMAL HIGH (ref 0.3–1.2)
Total Protein: 5.2 g/dL — ABNORMAL LOW (ref 6.5–8.1)

## 2020-06-01 LAB — GLUCOSE, CAPILLARY
Glucose-Capillary: 115 mg/dL — ABNORMAL HIGH (ref 70–99)
Glucose-Capillary: 68 mg/dL — ABNORMAL LOW (ref 70–99)
Glucose-Capillary: 81 mg/dL (ref 70–99)
Glucose-Capillary: 83 mg/dL (ref 70–99)
Glucose-Capillary: 88 mg/dL (ref 70–99)
Glucose-Capillary: 98 mg/dL (ref 70–99)
Glucose-Capillary: 98 mg/dL (ref 70–99)

## 2020-06-01 LAB — HEMOGLOBIN AND HEMATOCRIT, BLOOD
HCT: 36 % — ABNORMAL LOW (ref 39.0–52.0)
Hemoglobin: 11.8 g/dL — ABNORMAL LOW (ref 13.0–17.0)

## 2020-06-01 MED ORDER — POTASSIUM CHLORIDE 10 MEQ/100ML IV SOLN
10.0000 meq | INTRAVENOUS | Status: AC
  Administered 2020-06-01 (×4): 10 meq via INTRAVENOUS
  Filled 2020-06-01 (×4): qty 100

## 2020-06-01 MED ORDER — DEXTROSE 50 % IV SOLN
12.5000 g | INTRAVENOUS | Status: AC
  Administered 2020-06-01: 12.5 g via INTRAVENOUS
  Filled 2020-06-01: qty 50

## 2020-06-01 MED ORDER — LORAZEPAM 1 MG PO TABS
1.0000 mg | ORAL_TABLET | Freq: Once | ORAL | Status: AC
  Administered 2020-06-01: 1 mg via ORAL
  Filled 2020-06-01: qty 1

## 2020-06-01 MED ORDER — POTASSIUM CHLORIDE 10 MEQ/100ML IV SOLN
10.0000 meq | INTRAVENOUS | Status: AC
  Administered 2020-06-01 (×3): 10 meq via INTRAVENOUS
  Filled 2020-06-01 (×3): qty 100

## 2020-06-01 MED ORDER — SODIUM CHLORIDE 0.9 % IV BOLUS
500.0000 mL | Freq: Once | INTRAVENOUS | Status: AC
  Administered 2020-06-01: 500 mL via INTRAVENOUS

## 2020-06-01 NOTE — Procedures (Addendum)
Patient Name: Michael Wells  MRN: [id number]  Epilepsy Attending: Charlsie Quest  Referring Physician/Provider: Dr Marlin Canary Date: 05/31/2020 Duration: 26.34 mins  Patient history: 80 year old male with altered mental status.  EEG to evaluate for seizures.  Level of alertness: lethargic   AEDs during EEG study: None  Technical aspects: This EEG study was done with scalp electrodes positioned according to the 10-20 International system of electrode placement. Electrical activity was acquired at a sampling rate of 500Hz  and reviewed with a high frequency filter of 70Hz  and a low frequency filter of 1Hz . EEG data were recorded continuously and digitally stored.   Description: No posterior dominant rhythm was seen. EEG showed continuous generalized 5 to 6 Hz theta as well as intermittent generalized 2 to 3 Hz delta slowing. Hyperventilation and photic stimulation were not performed.     ABNORMALITY - Continuous slow, generalized  IMPRESSION: This study is suggestive of moderate diffuse encephalopathy, nonspecific etiology. No seizures or epileptiform discharges were seen throughout the recording.  Hamsini Verrilli 

## 2020-06-01 NOTE — Progress Notes (Signed)
Pt is stool is blood red.Provider Chotiner ,MD was notified via secure chat awaiting provider recomendations

## 2020-06-01 NOTE — Progress Notes (Signed)
Pt is upset bc wife left for the night. Pt pulled IV off , pulling leads off. MD made aware , will continue to [facility]

## 2020-06-01 NOTE — Progress Notes (Signed)
Physical Therapy Treatment Patient Details Name: Michael Wells MRN: [id number] DOB: [birth year] Today's Date: 06/01/2020    History of Present Illness Pt is a 80 y.o. male who presented to the ED 5/7 with diarrhea and weakness found to have colitis on CT and positive C. diff testing. Urine culture also grew E. coli. The patient was admitted, started on IVF and po vancomycin with modest improvement in symptoms initially but now has declined again.  Hospitalization complicated by agitated delirium, intermittent hypoxia, and electrolyte derangements.  Poor overall prognosis. 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    PT Comments    Pt with limited ability to participate today.  He was limited due to confusion, incontinence of stool, and lethargy.  Did not have plus 2 assist to advance pt OOB, however, likely would not have tolerated/been able to participate with OOB activity today due to above.  Performed bed mobility with max multimodal cues and assist and exercises with max cues.  Progress as able.      Follow Up Recommendations  SNF;Supervision/Assistance - 24 hour     Equipment Recommendations  Wheelchair (measurements PT);Wheelchair cushion (measurements PT);Hospital bed (medical transport; hoyer; further assessment post acute)    Recommendations for Other Services       Precautions / Restrictions Precautions Precautions: Fall    Mobility  Bed Mobility Overal bed mobility: Needs Assistance Bed Mobility: Rolling Rolling: Max assist         General bed mobility comments: Multiple rolls to each side for cleaning - attempted to provide multimodal cues and positioning for pt to assist but pt unable.  He was total A to scoot up in bed.    Transfers                 General transfer comment: Unable to progress OOB or to EOB due to confusion, pt unable to assist, not following commands, did not  have +2 help available, and pt with constant diarrhea  Ambulation/Gait                 Stairs             Wheelchair Mobility    Modified Rankin (Stroke Patients Only)       Balance       Sitting balance - Comments: unable today                                    Cognition Arousal/Alertness: Lethargic Behavior During Therapy: Flat affect Overall Cognitive Status: Impaired/Different from baseline Area of Impairment: Orientation;Attention;Memory;Following commands;Safety/judgement;Problem solving;Awareness                 Orientation Level: Disoriented to;Place;Time;Situation Current Attention Level: Sustained Memory: Decreased short-term memory Following Commands: Follows one step commands inconsistently Safety/Judgement: Decreased awareness of deficits;Decreased awareness of safety Awareness: Intellectual Problem Solving: Slow processing;Decreased initiation;Requires verbal cues;Requires tactile cues;Difficulty sequencing General Comments: Pt with difficulty following any commands and with limited verbal communication. He was lethargic but did arouse to verbal stimuli.      Exercises General Exercises - Lower Extremity Ankle Circles/Pumps: AAROM;Both;10 reps;Supine Quad Sets: AAROM;Both;10 reps;Supine Heel Slides: AAROM;Supine;Both;10 reps Hip ABduction/ADduction: AAROM;Both;10 reps;Supine Other Exercises Other Exercises: Requiring max multimodal cues for all, total assist to initiate and mod A for ROM    General Comments  Pertinent Vitals/Pain Pain Assessment: Faces Faces Pain Scale: Hurts whole lot (2 - rest; 8- with pericare) Pain Location: generalized; buttock Pain Descriptors / Indicators: Grimacing;Moaning Pain Intervention(s): Limited activity within patient's tolerance;Monitored during session;Repositioned;Relaxation    Home Living                      Prior Function            PT Goals (current  goals can now be found in the care plan section) Acute Rehab PT Goals Patient Stated Goal: unable to state PT Goal Formulation: With patient Time For Goal Achievement: 06/06/20 Potential to Achieve Goals: Fair Progress towards PT goals: Not progressing toward goals - comment (limited due to incontinence, delirium,)    Frequency    Min 2X/week      PT Plan Current plan remains appropriate    Co-evaluation              AM-PAC PT "6 Clicks" Mobility   Outcome Measure  Help needed turning from your back to your side while in a flat bed without using bedrails?: A Lot Help needed moving from lying on your back to sitting on the side of a flat bed without using bedrails?: Total Help needed moving to and from a bed to a chair (including a wheelchair)?: Total Help needed standing up from a chair using your arms (e.g., wheelchair or bedside chair)?: Total Help needed to walk in hospital room?: Total Help needed climbing 3-5 steps with a railing? : Total 6 Click Score: 7    End of Session   Activity Tolerance: Other (comment);Patient limited by lethargy;Patient limited by fatigue (incontinence of stool; +2 help not available) Patient left: in bed;with call bell/phone within reach;with bed alarm set Nurse Communication: Mobility status;Need for lift equipment PT Visit Diagnosis: Muscle weakness (generalized) (M62.81);Difficulty in walking, not elsewhere classified (R26.2)     Time: 4268-3419 PT Time Calculation (min) (ACUTE ONLY): 20 min  Charges:  $Therapeutic Exercise: 8-22 mins                     Anise Salvo, PT Acute Rehab Services Pager [phone] [facility] [phone]     Michael Wells 06/01/2020, 5:42 PM

## 2020-06-01 NOTE — Plan of Care (Signed)
  Problem: Education: Goal: Knowledge of General Education information will improve Description: Including pain rating scale, medication(s)/side effects and non-pharmacologic comfort measures Outcome: Progressing   Problem: Health Behavior/Discharge Planning: Goal: Ability to manage health-related needs will improve Outcome: Progressing   Problem: Clinical Measurements: Goal: Ability to maintain clinical measurements within normal limits will improve Outcome: Progressing Goal: Will remain free from infection Outcome: Progressing Goal: Diagnostic test results will improve Outcome: Progressing Goal: Respiratory complications will improve Outcome: Progressing Goal: Cardiovascular complication will be avoided Outcome: Progressing   Problem: Activity: Goal: Risk for activity intolerance will decrease Outcome: Progressing   Problem: Nutrition: Goal: Adequate nutrition will be maintained Outcome: Progressing   Problem: Coping: Goal: Level of anxiety will decrease Outcome: Progressing   Problem: Elimination: Goal: Will not experience complications related to bowel motility Outcome: Progressing Goal: Will not experience complications related to urinary retention Outcome: Progressing   Problem: Pain Managment: Goal: General experience of comfort will improve Outcome: Progressing   Problem: Safety: Goal: Ability to remain free from injury will improve Outcome: Progressing   Problem: Skin Integrity: Goal: Risk for impaired skin integrity will decrease Outcome: Progressing   Problem: Fluid Volume: Goal: Hemodynamic stability will improve Outcome: Progressing   Problem: Clinical Measurements: Goal: Diagnostic test results will improve Outcome: Progressing Goal: Signs and symptoms of infection will decrease Outcome: Progressing   Problem: Respiratory: Goal: Ability to maintain adequate ventilation will improve Outcome: Progressing   Problem: Safety: Goal:  Non-violent Restraint(s) Outcome: Progressing   

## 2020-06-01 NOTE — Progress Notes (Signed)
BP 94/46, unable to obtain oral or axillary temp, rectal temp 94.6. Noted small amount of blood coming from rectum before obtain rectal temp, night shift also reported blood stool. Pt O2 keeps dropping into the 80s, oxygen increased to 6LPM. Pt is lethargic and hard to arouse. MD made aware, see new orders.

## 2020-06-01 NOTE — Progress Notes (Signed)
SLP Cancellation Note  Patient Details Name: Michael Wells MRN: [id number] DOB: 1940-03-08   Cancelled treatment:       Reason Eval/Treat Not Completed: Fatigue/lethargy limiting ability to participate (Pt's case was discussed with Amrah, LPN who indicated that the pt is very lethargic and only opens his eyes slightly with significant stimulation. She further reported that pt has been made a DRN and that a Palliative care meeting is planned for today. SLP will follow up on subsequent date unless contacted sooner by staff due to improvement in status.)  [name] [name], MS, CCC-SLP Acute Rehabilitation Services Office number [phone] Pager [phone]   Scheryl Marten 06/01/2020, 10:13 AM

## 2020-06-01 NOTE — Progress Notes (Signed)
PROGRESS NOTE  Marcell BarlowHenry H Jobst  HQI:[id number]:[id number] DOB: 05-Mar-1940 DOA: 06/12/2020 PCP: Marva PandaMillsaps, Kimberly, NP     Brief Narrative: Marcell BarlowHenry H Hubbert is a 80 y.o. 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 diff testing. Urine culture also grew E. coli. The patient was admitted, started on IVF and po vancomycin with modest improvement in symptoms initially but now has declined again.  Hospitalization complicated by agitated delirium, intermittent hypoxia, and electrolyte derangements.  Poor overall prognosis.    Assessment & Plan: Principal Problem:   Acute colitis Active Problems:   Sepsis (HCC)   History of embolic stroke   Skin ulcer of buttock, limited to breakdown of skin (HCC)   Hyponatremia   Acute metabolic encephalopathy   Acute respiratory failure with hypoxia (HCC)   Hyperglycemia   Clostridium difficile colitis   Severe sepsis due to C. difficile colitis:  -  on po vancomycin 125mg  QID dosing, abd exam benign,  course of 14 days.  - Persistent leukocytosis, though patient is afebrile-- WBC 34 >> 18. Stool characteristics becoming less watery. Abd benign.  - Contact precautions. - blood cultures (NGTD).  Acute hypoxic respiratory failure due to atelectasis: CXR without infiltrate or pulmonary edema, +atelectasis.  - Wean oxygen as tolerated - Encourage use of IS- when patient able  - D-dimer elevated and L > RLE swelling. V/Q scan showed no perfusion defects. LE U/S for DVT is negative - Aspiration precautions. 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  -?CVA: CT head stable -ABG unrevealing -EEG with gen slowing -monitor blood  sugar to avoid low-- initial BS was <100  TSH 0.995, folic acid wnl.  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  - Continue IM supplementation.   E. coli UTI: Unclear reliability regarding symptoms.  - Tx'd with keflex   - Blood cultures negative to date.  Ascending aortic aneurysm: Incidentally noted on CTA, 4.2cm.  - CTA or MRA recommended semiannually.   Prediabetes: HbA1c 5.8%.   Hypokalemia: Improved with supplementation, down a bit after stopping IVF - Repeat supplementation w/repeated loop diuretic today.    Hyperbilirubinemia: Improving. RUQ U/S with cholelithiasis without biliary dilatation or evidence of inflammation.  Hyponatremia: Stable  DVT prophylaxis: scd Code Status: DNR- long discussion regarding his poor prognosis-- wife agreeable to continue treatment but change to DNR.  SHe is also agreeable to meet with palliative care Family Communication: spoke with son on phone 5/18 and wife at bedside 5/19 Disposition Plan:  Status is: Inpatient  Remains inpatient appropriate because:Altered mental status and IV treatments appropriate due to intensity of illness or inability to take PO. Will get palliative care consult Dispo: The patient is from: Home              Anticipated d/c is to: SNF  When able to participate              Patient currently is  not medically stable to d/c.    Difficult to place patient No  Consultants:   Palliative care- poor overall prognosis    Long discussion yesterday with son and today with wife regarding patient's poor prognosis.  Do not think he is currently appropriate for SNF.  Wife agreeable with DNR status but would like to treat what is treatable for now.  Son describes  patient had a "good" day on Sunday and was awake and interactive.  Patient does not appear to be in pain but did discuss with wife that we would need to shift to comfort care if he appears uncomfortable.    Subjective: Not responsive.  Moaned in pain when I touched his foot -nursing reports episode of blood in stool last PM  Objective: Vitals:   06/01/20 0700 06/01/20 0830 06/01/20 0850 06/01/20 1201  BP: (!) 94/46 131/74 127/64 135/77  Pulse: 75 63 76 77  Resp: 12 12 11 17   Temp:      TempSrc:      SpO2: 96% 100% 100% 93%  Weight:      Height:        Intake/Output Summary (Last 24 hours) at 06/01/2020 1408 Last data filed at 06/01/2020 1055 Gross per 24 hour  Intake 770.73 ml  Output --  Net 770.73 ml   Filed Weights   05/31/2020 1353  Weight: 77.1 kg    General: Appearance:     Frail ill appearing male in no acute distress     Lungs:     respirations unlabored  Heart:    Normal heart rate. Normal rhythm. No murmurs, rubs, or gallops.   MS:   All extremities are intact.   Neurologic:   Not awake, not following commands                          Data Reviewed: I have personally reviewed following labs and imaging studies  CBC: Recent Labs  Lab 05/26/20 0058 05/28/20 0342 05/29/20 0350 05/30/20 0729 06/01/20 0252 06/01/20 0705  WBC 14.8* 21.6* 19.5* 18.4* 20.1*  --   NEUTROABS  --   --  17.5*  --   --   --   HGB 12.3* 14.5 13.9 13.0 11.9* 11.8*  HCT 36.7* 41.9 40.2 37.2* 35.4* 36.0*  MCV 96.1 95.4 95.3 94.9 97.8  --   PLT 143* 151 168 147* 134*  --    Basic Metabolic Panel: Recent Labs  Lab 05/27/20 0033 05/28/20 0342 05/29/20 0350 05/30/20 0729 06/01/20 0252  NA 135 137 137 140 140  K 3.9 3.5 3.5 3.3* 2.8*  CL 105 105 106 103 106  CO2 23 23 23 28 28   GLUCOSE 120* 112* 91 79 107*  BUN 7* 11 13 16 13   CREATININE 0.65 0.72 0.82 0.92 0.78  CALCIUM 9.0 9.1 9.3 9.0 9.3  MG 1.8  --  1.7  --   --    GFR: Estimated Creatinine Clearance: 71.1  mL/min (by C-G formula based on SCr of 0.78 mg/dL). Liver Function Tests: Recent Labs  Lab 06/01/20 0252  AST 99*  ALT 132*  ALKPHOS 569*  BILITOT 3.0*  PROT 5.2*  ALBUMIN 1.9*   No results for input(s): LIPASE, AMYLASE in the last 168 hours. Recent Labs  Lab 05/31/20 1148  AMMONIA 38*   Coagulation Profile: No results for input(s): INR, PROTIME in the last 168 hours. Cardiac Enzymes: No results for input(s): CKTOTAL, CKMB, CKMBINDEX, TROPONINI in the last  168 hours. BNP (last 3 results) No results for input(s): PROBNP in the last 8760 hours. HbA1C: No results for input(s): HGBA1C in the last 72 hours. CBG: Recent Labs  Lab 05/31/20 1953 05/31/20 2335 06/01/20 0337 06/01/20 0739 06/01/20 1209  GLUCAP 96 97 83 115* 98   Lipid Profile: No results for input(s): CHOL, HDL, LDLCALC, TRIG, CHOLHDL, LDLDIRECT in the last 72 hours. Thyroid Function Tests: No results for input(s): TSH, T4TOTAL, FREET4, T3FREE, THYROIDAB in the last 72 hours. Anemia Panel: No results for input(s): VITAMINB12, FOLATE, FERRITIN, TIBC, IRON, RETICCTPCT in the last 72 hours. Urine analysis:    Component Value Date/Time   COLORURINE YELLOW 05/21/2020 0036   APPEARANCEUR HAZY (A) 05/21/2020 0036   LABSPEC >1.046 (H) 05/21/2020 0036   PHURINE 6.0 05/21/2020 0036   GLUCOSEU NEGATIVE 05/21/2020 0036   HGBUR NEGATIVE 05/21/2020 0036   BILIRUBINUR NEGATIVE 05/21/2020 0036   KETONESUR NEGATIVE 05/21/2020 0036   PROTEINUR NEGATIVE 05/21/2020 0036   NITRITE POSITIVE (A) 05/21/2020 0036   LEUKOCYTESUR LARGE (A) 05/21/2020 0036   No results found for this or any previous visit (from the past 240 hour(s)).    Radiology Studies: CT HEAD WO CONTRAST  Result Date: 05/31/2020 CLINICAL DATA:  Altered mental status EXAM: CT HEAD WITHOUT CONTRAST TECHNIQUE: Contiguous axial images were obtained from the base of the skull through the vertex without intravenous contrast. COMPARISON:  03/09/2020 FINDINGS:  Brain: Chronic atrophic changes and white matter ischemic changes are again seen. No acute infarct, acute hemorrhage or space-occupying mass lesion is noted. Vascular: No hyperdense vessel or unexpected calcification. Skull: Normal. Negative for fracture or focal lesion. Sinuses/Orbits: No acute finding. Other: None. IMPRESSION: Chronic atrophic and ischemic changes without acute abnormality. The overall appearance is similar to that seen on prior exam. Electronically Signed   By: Alcide Clever M.D.   On: 05/31/2020 16:28   DG CHEST PORT 1 VIEW  Result Date: 06/01/2020 CLINICAL DATA:  Respiratory distress. EXAM: PORTABLE CHEST 1 VIEW COMPARISON:  05/29/2020. FINDINGS: Cardiomegaly. Bilateral interstitial prominence and small left pleural effusion. Findings suggest CHF. Pneumonitis can not be excluded. Left base subsegmental atelectasis again noted. No pneumothorax. IMPRESSION: Cardiomegaly with bilateral interstitial prominence and small left pleural effusions suggesting CHF. Pneumonitis cannot be excluded. Left base subsegmental atelectasis again noted. Electronically Signed   By: Maisie Fus  Register   On: 06/01/2020 10:13   EEG adult  Result Date: 06/01/2020 Charlsie Quest, MD     06/01/2020  8:35 AM Patient Name: ANIS DEGIDIO MRN: [id number] Epilepsy Attending: Charlsie Quest Referring Physician/Provider: Dr Marlin Canary Date: 05/31/2020 Duration: 26.34 mins Patient history: 80 year old male with altered mental status.  EEG to evaluate for seizures. Level of alertness: lethargic AEDs during EEG study: None Technical aspects: This EEG study was done with scalp electrodes positioned according to the 10-20 International system of electrode placement. Electrical activity was acquired at a sampling rate of 500Hz  and reviewed with a high frequency filter of 70Hz  and a low frequency filter of 1Hz . EEG data were recorded continuously and digitally stored. Description: No posterior dominant rhythm was seen. EEG  showed continuous generalized 5 to 6 Hz theta as well as intermittent generalized 2 to 3 Hz delta slowing. Hyperventilation and photic stimulation were not performed.   ABNORMALITY - Continuous slow, generalized IMPRESSION: This study is suggestive of moderate diffuse encephalopathy, nonspecific etiology. No seizures or epileptiform discharges were seen throughout the recording. Priyanka O Yadav   VAS LOWER EXTREMITY VENOUS (DVT)  Result Date: 05/31/2020  Lower Venous DVT Study Patient Name:  BURKE TERRY  Date of Exam:   05/31/2020 Medical Rec #: [id number]       Accession #:    [id number] Date of Birth: [birth year]       Patient Gender: M Patient Age:   79Y Exam Location:  [facility] Procedure:      VAS [place] LOWER EXTREMITY VENOUS (DVT) Referring Phys: [id number] Tyrone Nine --------------------------------------------------------------------------------  Indications: Edema.  Limitations: Body habitus, poor ultrasound/tissue interface, bandages and sunlight. Comparison Study: No prior study Performing Technologist: Gertie Fey MHA, RDMS, RVT, RDCS  Examination Guidelines: A complete evaluation includes B-mode imaging, spectral Doppler, color Doppler, and power Doppler as needed of all accessible portions of each vessel. Bilateral testing is considered an integral part of a complete examination. Limited examinations for reoccurring indications may be performed as noted. The reflux portion of the exam is performed with the patient in reverse Trendelenburg.  +---------+---------------+---------+-----------+----------+--------------+ RIGHT    CompressibilityPhasicitySpontaneityPropertiesThrombus Aging +---------+---------------+---------+-----------+----------+--------------+ FV Prox  Full                                                        +---------+---------------+---------+-----------+----------+--------------+ FV Mid   Full                                                         +---------+---------------+---------+-----------+----------+--------------+ FV DistalFull                                                        +---------+---------------+---------+-----------+----------+--------------+ POP      Full           Yes      Yes                                 +---------+---------------+---------+-----------+----------+--------------+ PTV      Full                                                        +---------+---------------+---------+-----------+----------+--------------+ PERO     Full                                                        +---------+---------------+---------+-----------+----------+--------------+   Right Technical Findings: Not visualized segments include CFV, SFJ, PFV.  +---------+---------------+---------+-----------+----------+--------------+ LEFT     CompressibilityPhasicitySpontaneityPropertiesThrombus Aging +---------+---------------+---------+-----------+----------+--------------+ CFV      Full           Yes      Yes                                 +---------+---------------+---------+-----------+----------+--------------+  SFJ      Full                                                        +---------+---------------+---------+-----------+----------+--------------+ FV Prox  Full                                                        +---------+---------------+---------+-----------+----------+--------------+ FV Mid   Full                                                        +---------+---------------+---------+-----------+----------+--------------+ FV DistalFull                                                        +---------+---------------+---------+-----------+----------+--------------+ PFV      Full                                                        +---------+---------------+---------+-----------+----------+--------------+ POP      Full           Yes      Yes                                  +---------+---------------+---------+-----------+----------+--------------+ PTV      Full                                                        +---------+---------------+---------+-----------+----------+--------------+ PERO     Full                                                        +---------+---------------+---------+-----------+----------+--------------+     Summary: RIGHT: - There is no evidence of deep vein thrombosis in the lower extremity. However, portions of this examination were limited- see technologist comments above.  - No cystic structure found in the popliteal fossa.  LEFT: - There is no evidence of deep vein thrombosis in the lower extremity.  - No cystic structure found in the popliteal fossa.  *See table(s) above for measurements and observations. Electronically signed by Gretta Began MD on 05/31/2020 at 8:34:34 PM.    Final     Scheduled Meds: . aspirin  81 mg Oral Daily  . atorvastatin  40 mg Oral Daily  . cyanocobalamin  1,000 mcg Intramuscular Weekly  . enoxaparin (LOVENOX) injection  40 mg Subcutaneous Q24H  . mouth rinse  15 mL Mouth Rinse BID  . ticagrelor  90 mg Oral BID  . vancomycin  125 mg Oral QID   Continuous Infusions:    LOS: 11 days   Time spent: 35 minutes.  Joseph Art, DO [facility] [email or web address] 06/01/2020, 2:08 PM

## 2020-06-02 DIAGNOSIS — K529 Noninfective gastroenteritis and colitis, unspecified: Secondary | ICD-10-CM | POA: Diagnosis not present

## 2020-06-02 DIAGNOSIS — G9341 Metabolic encephalopathy: Secondary | ICD-10-CM | POA: Diagnosis not present

## 2020-06-02 DIAGNOSIS — J9601 Acute respiratory failure with hypoxia: Secondary | ICD-10-CM | POA: Diagnosis not present

## 2020-06-02 DIAGNOSIS — Z9189 Other specified personal risk factors, not elsewhere classified: Secondary | ICD-10-CM

## 2020-06-02 DIAGNOSIS — R7989 Other specified abnormal findings of blood chemistry: Secondary | ICD-10-CM | POA: Diagnosis not present

## 2020-06-02 DIAGNOSIS — Z515 Encounter for palliative care: Secondary | ICD-10-CM

## 2020-06-02 DIAGNOSIS — Z7189 Other specified counseling: Secondary | ICD-10-CM

## 2020-06-02 LAB — GLUCOSE, CAPILLARY
Glucose-Capillary: 75 mg/dL (ref 70–99)
Glucose-Capillary: 76 mg/dL (ref 70–99)
Glucose-Capillary: 78 mg/dL (ref 70–99)
Glucose-Capillary: 82 mg/dL (ref 70–99)
Glucose-Capillary: 86 mg/dL (ref 70–99)
Glucose-Capillary: 86 mg/dL (ref 70–99)
Glucose-Capillary: 88 mg/dL (ref 70–99)
Glucose-Capillary: 96 mg/dL (ref 70–99)

## 2020-06-02 MED ORDER — SODIUM CHLORIDE 0.9 % IV SOLN: INTRAVENOUS | Status: DC

## 2020-06-02 NOTE — Progress Notes (Signed)
Occupational Therapy Treatment Patient Details Name: Michael Wells MRN: [id number] DOB: 04-Sep-1940 Today's Date: 06/02/2020    History of present illness Pt is a 80 y.o. male who presented to the ED 5/7 with diarrhea and weakness found to have colitis on CT and positive C. diff testing. Urine culture also grew E. coli. The patient was admitted, started on IVF and po vancomycin with modest improvement in symptoms initially but now has declined again.  Hospitalization complicated by agitated delirium, intermittent hypoxia, and electrolyte derangements.  Poor overall prognosis. 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   OT comments  Pt. Seen for skilled OT treatment session.  Wife present and active in session also.  Pt. With notable lethargy.  Unable to maintain eyes open and fully participate in task before closing eyes and falling asleep.  Max stimuli provided and hand over hand assistance for face washing and oral care.  All tasks at total a.  Will continue to progress with adls next session as pt. Able.    Follow Up Recommendations  SNF    Equipment Recommendations       Recommendations for Other Services      Precautions / Restrictions Precautions Precautions: Fall       Mobility Bed Mobility                    Transfers                      Balance                                           ADL either performed or assessed with clinical judgement   ADL Overall ADL's : Needs assistance/impaired     Grooming: Total assistance;Bed level;Oral care Grooming Details (indicate cue type and reason): Total A to wash face and perform oral care with bed in chair position. Cues for sequencing and initiation.                               General ADL Comments: Patient with eyes closed most of session despite changes in enviroment and change in position.  wife present and also trying to engage pt. to keep eyes open and engage in grooming tasks. pt. would speak to her but wife had notable difficulty decifering what he was saying.     Vision       Perception     Praxis      Cognition Arousal/Alertness: Lethargic Behavior During Therapy: Flat affect                                   General Comments: difficult to assess secondary to lethargic with minimal engagement        Exercises     Shoulder Instructions       General Comments      Pertinent Vitals/ Pain       Pain Assessment: Faces Faces Pain Scale: No hurt  Home Living  Prior Functioning/Environment              Frequency  Min 2X/week        Progress Toward Goals  OT Goals(current goals can now be found in the care plan section)  Progress towards OT goals: Progressing toward goals     Plan Discharge plan remains appropriate;Frequency remains appropriate    Co-evaluation                 AM-PAC OT "6 Clicks" Daily Activity     Outcome Measure   Help from another person eating meals?: Total Help from another person taking care of personal grooming?: Total Help from another person toileting, which includes using toliet, bedpan, or urinal?: Total Help from another person bathing (including washing, rinsing, drying)?: Total Help from another person to put on and taking off regular upper body clothing?: Total Help from another person to put on and taking off regular lower body clothing?: Total 6 Click Score: 6    End of Session    OT Visit Diagnosis: Unsteadiness on feet (R26.81);Other abnormalities of gait and mobility (R26.89);Repeated falls (R29.6);Muscle weakness (generalized) (M62.81);History of falling (Z91.81);Adult, failure to thrive (R62.7)   Activity Tolerance Patient limited by fatigue;Patient limited by lethargy   Patient Left in bed;with call bell/phone  within reach;with bed alarm set;with family/visitor present   Nurse Communication Other (comment) (saw pts. rn in hallway after session, reported attempts at session with poor participation and ability to engage and remain awake)        Time: [phone] OT Time Calculation (min): 12 min  Charges: OT General Charges $OT Visit: 1 Visit OT Treatments $Self Care/Home Management : 8-22 mins  Boneta Lucks, COTA/L Acute Rehabilitation [phone]    06/02/2020, 10:56 AM

## 2020-06-02 NOTE — Progress Notes (Signed)
Came by to see patient, having periods of apnea.  Sister in law at the bedside, she is very much realistic on patient's prognosis.  Appreciate palliative care team as well.  Son to come in from [place] and wife to return as well.  Suspect patient will continue to worsen and may need to transition to comfort care measures after family arrives.  Will continue to monitor and update cross-coverage to follow up.  Marlin Canary DO

## 2020-06-02 NOTE — Progress Notes (Signed)
   06/02/20 1031  Clinical Encounter Type  Visited With Patient and family together  Visit Type Spiritual support  Referral From Nurse  Consult/Referral To Chaplain  Chaplain responded to consult. The patient's wife [name] is at bedside. This chaplain provided presence of actively listening, comforted Mrs. And prayed with her. She was upset about the doctor's report. She stated her faith would not allow her to give up on her husband. She stated she knows God will heal him and she is not ready to hear otherwise. This chaplain talked to her about suffering, and she agrees she does not want him to suffer. She did state she trust God in this situation. This chaplain alerted the nurse that the patient appeared to be pulling on his mittens and the oxygen mask tubing. This note was prepared by Deneen Harts, M.Div..  For questions please contact by phone [phone].

## 2020-06-02 NOTE — Progress Notes (Signed)
  Speech Language Pathology Treatment: Dysphagia  Patient Details Name: Michael Wells MRN: [id number] DOB: [birth year] Today's Date: 06/02/2020 Time: [id number] SLP Time Calculation (min) (ACUTE ONLY): 15 min  Assessment / Plan / Recommendation Clinical Impression  Pt was seen for dysphagia treatment. His level of alertness was improved at the onset of the session, but this waned as the session progressed and did not improve significantly despite verbal/tactile stimulation from this SLP or his wife. Pt tolerated ice chips without overt s/sx of aspiration, but exhibited inconsistent throat clearing with thin liquids via tsp and cup. Impaired bolus awareness continues to be noted with absent bolus manipulation and need for removal of puree boluses. Pt does not present as a candidate for safe oral intake and it is therefore recommended that the pt's NPO status be maintained. If pt becomes adequately alert (as determined by staff), pt may have individual ice chips following oral care. Pt's wife was educated regarding pt's performance, the fact that it is not significantly improved compared to 5/18, and his high risk of aspiration at this time. She verbalized understanding, but stated, "I guess I'm in denial and don't want to accept it, but he was so awake before.Marland KitchenMarland KitchenI guess the infection can really affect you." SLP will continue to follow pt pending decision regarding GOC.     HPI HPI: Pt is a 80 year old male admitted Jun 18, 2020 with complaints of diarrhea and weakness. Dx significant colitis, C. diff, metabolic enceophalopathy, sepsis. Hospitalization complicated by agitated delirium, intermittent hypoxia, and electrolyte derangements. Silent aspiration questioned by Dr. Jarvis Newcomer on 5/17 due to persistent leukocytosis. CXR 5/16: Stable left basilar atelectasis. 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      SLP Plan   Continue with current plan of care       Recommendations  Diet recommendations: NPO                Oral Care Recommendations: Oral care BID;Oral care prior to ice chip/H20 (pt may have individual ice chips after oral care) Follow up Recommendations:  (TBD) SLP Visit Diagnosis: Dysphagia, unspecified (R13.10) Plan: Continue with current plan of care       Eiliyah Reh [name], MS, CCC-SLP Acute Rehabilitation Services Office number [phone] Pager [phone]                Scheryl Marten 06/02/2020, 9:39 AM

## 2020-06-02 NOTE — Consult Note (Signed)
Consultation Note Date: 06/02/2020   Patient Name: Michael Wells  DOB: [birth year]  MRN: [id number]  Age / Sex: 80 y.o., male  PCP: Everardo Beals, NP Referring Physician: Geradine Girt, DO  Reason for Consultation: Establishing goals of care  Per intake->HPI/Patient Profile: 80 y.o. 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 Work up showed acute c.diff colitis with concurrent sepsis.  Hospitalization complicated by agitated delirium, intermittent hypoxia, and electrolyte derangements.  Palliative care has been consulted to assist with goals of care conversation given poor prognosis.  Clinical Assessment and Goals of Care:  I have reviewed medical records including EPIC notes, labs and imaging, received report from RN, assessed the patient, and met at the bedside along with his wife [name] to discuss diagnosis, prognosis, [place], EOL wishes, disposition and options.  I introduced Palliative Medicine as specialized medical care for people living with serious illness. It focuses on providing relief from the symptoms and stress of a serious illness. The goal is to improve quality of life for both the patient and the family.  We discussed a brief life review of the patient and then focused on their current illness. The natural disease trajectory and expectations at EOL were discussed.   I attempted to elicit values and goals of care important to the patient.    Spoke with son [name] via phone before meeting with [name] at the bedside. He feels confused and "blindsided" overall. He understands the severity of patient's illness and how difficult it is for him to "bounce back" at his age. He requests a straightforward approach and clarity on  patient's fluctuating mental status. 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 [name] finds this helpful, as he noticed potential signs of dementia PTA. Patient stated "don't forget about me son" just yesterday and [name] is hopeful for improvement, although he also acknowledges the "unfortunate reality" and voices appropriate concern regarding the "slim to none" chance this will occur. He states he is more realistic than his mother. We planned for an in-person meeting tomorrow morning, as he lives in [place].  I then met with wife [name] at the bedside. Ranbir and [name] are originally from the [place] area. They have been together since they were teenagers and their only child is [name]. He has 1 sister, 1 living brother (a Theme park manager), and 1 deceased brother. 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 He enjoys going for car rides, golfing, and listening to music. He is a Nurse, learning disability and [name] is a Psychologist, forensic. [name] shares that he recovered quite well from his stroke 3 months ago, although he did have some "shakiness" with walking. He was driving, conversing, and enjoying his life.   [name] is quite alarmed by how rapidly patient's health declined again after it seemed he  was recovering last week. We reviewed his illness at length and I provided update on labs as requested. Discussed the possibility of underlying dementia and silent aspiration and educated on the risk of complications even with artificial nutrition. I shared my concern for patient's overall decline, particularly respiratory failure and persistent encephalopathy. Counseled on the limited treatment options available and the importance of considering patient's quality of life when making difficult medical decisions. [name] understands that [name] is at high risk to decompensate  further if he remains unable to engage in his care or become alert enough to participate in PT/sustain himself with nutrition. She is hopeful for improvement with the help of God, but also willing to discuss inpatient comfort care.    The difference between aggressive medical intervention and comfort care was considered in light of the patient's goals of care. Patient would no longer receive aggressive medical interventions such as continuous vital signs, lab work, radiology testing, or medications not focused on comfort. All care would focus on how the patient is looking and feeling. This would include management of any symptoms that may cause discomfort, pain, shortness of breath, cough, nausea, agitation, anxiety, and/or secretions etc. Symptoms would be managed with medications and other non-pharmacological interventions such as spiritual support if requested, repositioning, music therapy, or therapeutic listening. Reviewed liberalized visitation policy if family chooses comfort path. Linda verbalizes her understanding and interest in discussing further with her son.  Advanced directives, concepts specific to code status, artifical feeding and hydration, and rehospitalization were considered and discussed.  Hospice and Palliative Care services outpatient were explained and offered.  Discussed the importance of continued conversation with family and the medical providers regarding overall plan of care and treatment options, ensuring decisions are within the context of the patient's values and GOCs.    Questions and concerns were addressed.  Hard Choices booklet left for review. The family was encouraged to call with questions or concerns.  PMT will continue to support holistically.   NEXT OF KIN is patient's wife [name]. She consults with her son [name] to make decisions as a family. No HCPOA on file.    SUMMARY OF RECOMMENDATIONS   -Continue current interventions for now  -[name] is not  ready for transition to comfort care at this time, however she is interested and willing to readdress if patient does not improve or appears to be suffering. She will discuss further with her son [name] -[name] becomes very upset when the term "hospice" is used but responded well to the idea of comfort care in the hospital  -Psychosocial and emotional support provided -Tentative meeting with [name] in person when he arrives tomorrow morning 5/21  Code Status/Advance Care Planning:  DNR  Palliative Prophylaxis:   Aspiration, Bowel Regimen, Delirium Protocol and Frequent Pain Assessment  Additional Recommendations (Limitations, Scope, Preferences):  Full Scope Treatment  Psycho-social/Spiritual:   Desire for further Chaplaincy support:yes  Additional Recommendations: Education on Hospice and Grief/Bereavement Support  Prognosis:   Poor prognosis given complications over course of hospitalization, ancipitate hospital death vs residential hospice  Discharge Planning: To Be Determined      Primary Diagnoses: Present on Admission: . Acute colitis . Sepsis ([place]) . Skin ulcer of buttock, limited to breakdown of skin ([place]) . Hyponatremia . Acute metabolic encephalopathy . Acute respiratory failure with hypoxia ([place]) . Hyperglycemia . Clostridium difficile colitis   I have reviewed the medical record, interviewed the patient and family, and examined the patient. The following aspects are pertinent.  Past Medical History:  Diagnosis Date  . Anxiety   . Carotid artery stenosis, symptomatic, right 06/08/2020  . Chronic pain   . H/O sleep disturbance   . History of embolic stroke 04/16/3293   Social History   Socioeconomic History  . Marital status: Married    Spouse name: Not on file  . Number of children: Not on file  . Years of education: Not on file  . Highest education level: Not on file  Occupational History  . Not on file  Tobacco Use  . Smoking status: Never Smoker   . Smokeless tobacco: Never Used  Vaping Use  . Vaping Use: Never used  Substance and Sexual Activity  . Alcohol use: Yes    Alcohol/week: 0.0 standard drinks    Comment: socially 1-2 drinks month  . Drug use: No  . Sexual activity: Not on file  Other Topics Concern  . Not on file  Social History Narrative  . Not on file   Social Determinants of Health   Financial Resource Strain: Not on file  Food Insecurity: Not on file  Transportation Needs: Not on file  Physical Activity: Not on file  Stress: Not on file  Social Connections: Not on file   Family History  Problem Relation Age of Onset  . Cancer Mother        cervical  . Cancer Father        prostate  . Arthritis Sister   . Drug abuse Brother   . Mental retardation Brother   . Aneurysm Brother    Scheduled Meds: . atorvastatin  40 mg Oral Daily  . cyanocobalamin  1,000 mcg Intramuscular Weekly  . mouth rinse  15 mL Mouth Rinse BID  . vancomycin  125 mg Oral QID   Continuous Infusions: . sodium chloride 75 mL/hr at 06/02/20 1224   PRN Meds:.acetaminophen **OR** acetaminophen, ondansetron **OR** ondansetron (ZOFRAN) IV, Zinc Oxide Medications Prior to Admission:  Prior to Admission medications   Medication Sig Start Date End Date Taking? Authorizing Provider  aspirin 81 MG EC tablet Take 1 tablet (81 mg total) by mouth daily. Swallow whole. 03/11/20  Yes Little Ishikawa, MD  atorvastatin (LIPITOR) 40 MG tablet Take 1 tablet (40 mg total) by mouth daily. 03/11/20  Yes Little Ishikawa, MD  HYDROcodone-acetaminophen (NORCO/VICODIN) 5-325 MG tablet Take 1 tablet by mouth every 6 (six) hours as needed for moderate pain.   Yes [provider]  ticagrelor (BRILINTA) 90 MG TABS tablet Take 1 tablet (90 mg total) by mouth 2 (two) times daily. 05/05/20  Yes Theresa Duty, NP   No Known Allergies Review of Systems  Unable to perform ROS: Mental status change    Physical Exam Vitals and nursing note  reviewed.  Constitutional:      Appearance: He is cachectic. He is ill-appearing.     Interventions: Nasal cannula in place.     Comments: 2L  HENT:     Mouth/Throat:     Mouth: Mucous membranes are dry.  Cardiovascular:     Rate and Rhythm: Normal rate.  Pulmonary:     Effort: Pulmonary effort is normal.  Neurological:     Mental Status: He is unresponsive.     Vital Signs: BP (!) 110/47 (BP Location: Right Arm)   Pulse 83   Temp (!) 97.5 F (36.4 C) (Axillary)   Resp 17   Ht _0  (1.702 m)   Wt 77.1 kg   SpO2 93%  BMI 26.63 kg/m  Pain Scale: Faces POSS *See Group Information*: 1-Acceptable,Awake and alert Pain Score: 0-No pain   SpO2: SpO2: 93 % O2 Device:SpO2: 93 % O2 Flow Rate: .O2 Flow Rate (L/min): 2 L/min  IO: Intake/output summary:   Intake/Output Summary (Last 24 hours) at 06/02/2020 1451 Last data filed at 06/02/2020 1411 Gross per 24 hour  Intake --  Output 700 ml  Net -700 ml    LBM: Last BM Date: 06/01/20 Baseline Weight: Weight: 77.1 kg Most recent weight: Weight: 77.1 kg     Palliative Assessment/Data: 10%     Time In: 1:30pm Time Out: 2:45pm Time Total: 75 minutes  Greater than 50% of this time was spent in counseling and coordinating care related to the above assessment and plan.  Dorthy Cooler, PA-C Palliative Medicine Team Team phone # [phone]  Thank you for allowing the Palliative Medicine Team to assist in the care of this patient. Please utilize secure chat with additional questions, if there is no response within 30 minutes please call the above phone number.  Palliative Medicine Team providers are available by phone from 7am to 7pm daily and can be reached through the team cell phone.  Should this patient require assistance outside of these hours, please call the patient's attending physician.

## 2020-06-02 NOTE — Progress Notes (Addendum)
PROGRESS NOTE  Michael Wells  OHY:[id number] DOB: [birth year] DOA: 06/11/20 PCP: Marva Panda, NP     Brief Narrative: Michael Wells is a 80 y.o. 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 diff testing. Urine culture also grew E. coli. The patient was admitted, started on IVF and po vancomycin with modest improvement in symptoms initially but now has declined again.  Hospitalization complicated by agitated delirium, intermittent hypoxia, and electrolyte derangements.  Poor overall prognosis.  Appears that since Sunday patient has had an overall declining trajectory.  Palliative care consulted and patient may need to be transitioned to comfort care.    Assessment & Plan: Principal Problem:   Acute colitis Active Problems:   Sepsis (HCC)   History of embolic stroke   Skin ulcer of buttock, limited to breakdown of skin (HCC)   Hyponatremia   Acute metabolic encephalopathy   Acute respiratory failure with hypoxia (HCC)   Hyperglycemia   Clostridium difficile colitis   Severe sepsis due to C. difficile colitis:  -  on po vancomycin 125mg  QID dosing, abd exam benign,  course of 14 days.  - Persistent leukocytosis, though patient is afebrile-- WBC 34 >> 18. Stool characteristics becoming less watery. - Contact precautions. - blood cultures (NGTD).  ? GI bleed Hold blood thinners -h/h stable  Acute hypoxic respiratory failure due to atelectasis: CXR without infiltrate , +atelectasis.  - Wean oxygen as tolerated - Encourage use of IS- when patient able  - D-dimer elevated and L > RLE swelling. V/Q scan showed no perfusion defects. LE U/S for DVT is negative - Aspiration precautions. ?if this and persistent leukocytosis is due to silent aspiration-- SLP eval unable to be done due to lethargy  Acute metabolic encephalopathy: Suspect possible  underlying vascular dementia/diminished cerebral reserve leading to agitated hospital delirium.  -?CVA: repeat CT head stable -ABG unrevealing -EEG with gen slowing -monitor blood sugar to avoid low-- initial BS was <100  TSH 0.995, folic acid wnl.  - Delirium precautions  -  melatonin and qHS zyprexa d/c'd several days ago by Dr.  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  - Continue IM supplementation.   E. coli UTI: Unclear reliability regarding symptoms.  - Tx'd with keflex   - Blood cultures negative to date.  Ascending aortic aneurysm: Incidentally noted on CTA, 4.2cm.  - CTA or MRA recommended semiannually.   Prediabetes: HbA1c 5.8%.   Hypokalemia: repleted again-- recheck in AM  Hyperbilirubinemia: Improving. RUQ U/S with cholelithiasis without biliary dilatation or evidence of inflammation.  Hyponatremia: Stable  DVT prophylaxis: scd Code Status: DNR- long discussion regarding his poor prognosis-- wife agreeable to continue treatment but change to DNR.  SHe is also agreeable to meet with palliative care to discuss comfort measures Family Communication: spoke with son on phone 5/18 and wife at bedside 5/20 Disposition Plan:  Status is: Inpatient  Remains inpatient appropriate because:Altered mental status and IV treatments appropriate due to intensity of illness or inability to take PO. Will get palliative care consult Dispo: The patient is from: Home              Anticipated  d/c is to: SNF  When able to participate vs hospice              Patient currently is not medically stable to d/c.    Difficult to place patient No  Consultants:   Palliative care- poor overall prognosis    Long discussion with son and today with wife regarding patient's poor prognosis over the last  several days.  Do not think he is currently appropriate for SNF.  Wife agreeable with DNR status but would like to treat what is treatable for now.  Son describes patient had a "good" day on Sunday and was awake and interactive.  Patient does not appear to be in pain but did discuss with wife that we would need to shift to comfort care if he appears uncomfortable.    Subjective: Asking for cold water- voice weak and patient unable to tolerate water safely  Objective: Vitals:   06/02/20 0747 06/02/20 0809 06/02/20 1010 06/02/20 1200  BP: (!) 99/47 (!) 113/58 108/65 (!) 110/47  Pulse: (!) 59 62 79 83  Resp: 17 15 13 17   Temp: (!) 97.2 F (36.2 C) (!) 97.2 F (36.2 C) (!) 97.4 F (36.3 C) (!) 97.5 F (36.4 C)  TempSrc: Oral Oral Axillary Axillary  SpO2: 97% 100%  93%  Weight:      Height:        Intake/Output Summary (Last 24 hours) at 06/02/2020 1509 Last data filed at 06/02/2020 1500 Gross per 24 hour  Intake 195 ml  Output 700 ml  Net -505 ml   Filed Weights   06/03/2020 1353  Weight: 77.1 kg     General: Appearance:     Frail elderly ill appearing male      Lungs:     On [place], poor effort respirations unlabored  Heart:    Normal heart rate.  MS:   All extremities are intact.   Neurologic:   Will awaken briefly and asks for cold water.       Data Reviewed: I have personally reviewed following labs and imaging studies  CBC: Recent Labs  Lab 05/28/20 0342 05/29/20 0350 05/30/20 0729 06/01/20 0252 06/01/20 0705  WBC 21.6* 19.5* 18.4* 20.1*  --   NEUTROABS  --  17.5*  --   --   --   HGB 14.5 13.9 13.0 11.9* 11.8*  HCT 41.9 40.2 37.2* 35.4* 36.0*  MCV 95.4 95.3 94.9 97.8  --   PLT 151 168 147* 134*  --    Basic Metabolic Panel: Recent Labs  Lab 05/27/20 0033 05/28/20 0342 05/29/20 0350 05/30/20 0729 06/01/20 0252  NA 135 137 137 140 140  K 3.9 3.5 3.5 3.3* 2.8*  CL 105 105 106 103 106  CO2 23 23 23 28 28   GLUCOSE 120* 112* 91 79 107*  BUN 7* 11 13 16  13   CREATININE 0.65 0.72 0.82 0.92 0.78  CALCIUM 9.0 9.1 9.3 9.0 9.3  MG 1.8  --  1.7  --   --    GFR: Estimated Creatinine Clearance: 71.1 mL/min (by C-G formula based on SCr of 0.78 mg/dL). Liver Function Tests: Recent Labs  Lab 06/01/20 0252  AST 99*  ALT 132*  ALKPHOS 569*  BILITOT 3.0*  PROT 5.2*  ALBUMIN 1.9*   No results for input(s): LIPASE, AMYLASE in the last 168 hours. Recent Labs  Lab 05/31/20 1148  AMMONIA 38*   Coagulation Profile: No results for input(s): INR, PROTIME in the last 168 hours.  Cardiac Enzymes: No results for input(s): CKTOTAL, CKMB, CKMBINDEX, TROPONINI in the last 168 hours. BNP (last 3 results) No results for input(s): PROBNP in the last 8760 hours. HbA1C: No results for input(s): HGBA1C in the last 72 hours. CBG: Recent Labs  Lab 06/02/20 0009 06/02/20 0427 06/02/20 0748 06/02/20 1004 06/02/20 1208  GLUCAP 75 96 88 78 86   Lipid Profile: No results for input(s): CHOL, HDL, LDLCALC, TRIG, CHOLHDL, LDLDIRECT in the last 72 hours. Thyroid Function Tests: No results for input(s): TSH, T4TOTAL, FREET4, T3FREE, THYROIDAB in the last 72 hours. Anemia Panel: No results for input(s): VITAMINB12, FOLATE, FERRITIN, TIBC, IRON, RETICCTPCT in the last 72 hours. Urine analysis:    Component Value Date/Time   COLORURINE YELLOW 05/21/2020 0036   APPEARANCEUR HAZY (A) 05/21/2020 0036   LABSPEC >1.046 (H) 05/21/2020 0036   PHURINE 6.0 05/21/2020 0036   GLUCOSEU NEGATIVE 05/21/2020 0036   HGBUR NEGATIVE 05/21/2020 0036   BILIRUBINUR NEGATIVE 05/21/2020 0036   KETONESUR NEGATIVE 05/21/2020 0036   PROTEINUR NEGATIVE 05/21/2020 0036   NITRITE POSITIVE (A) 05/21/2020 0036   LEUKOCYTESUR LARGE (A) 05/21/2020 0036   No results found for this or any previous visit (from the past 240 hour(s)).    Radiology Studies: DG CHEST PORT 1 VIEW  Result Date: 06/01/2020 CLINICAL DATA:  Respiratory distress. EXAM: PORTABLE CHEST 1 VIEW COMPARISON:   05/29/2020. FINDINGS: Cardiomegaly. Bilateral interstitial prominence and small left pleural effusion. Findings suggest CHF. Pneumonitis can not be excluded. Left base subsegmental atelectasis again noted. No pneumothorax. IMPRESSION: Cardiomegaly with bilateral interstitial prominence and small left pleural effusions suggesting CHF. Pneumonitis cannot be excluded. Left base subsegmental atelectasis again noted. Electronically Signed   By: Maisie Fus  Register   On: 06/01/2020 10:13   EEG adult  Result Date: 06/01/2020 Charlsie Quest, MD     06/01/2020  8:35 AM Patient Name: Michael Wells MRN: [id number] Epilepsy Attending: Charlsie Quest Referring Physician/Provider: Dr Marlin Canary Date: 05/31/2020 Duration: 26.34 mins Patient history: 80 year old male with altered mental status.  EEG to evaluate for seizures. Level of alertness: lethargic AEDs during EEG study: None Technical aspects: This EEG study was done with scalp electrodes positioned according to the 10-20 International system of electrode placement. Electrical activity was acquired at a sampling rate of 500Hz  and reviewed with a high frequency filter of 70Hz  and a low frequency filter of 1Hz . EEG data were recorded continuously and digitally stored. Description: No posterior dominant rhythm was seen. EEG showed continuous generalized 5 to 6 Hz theta as well as intermittent generalized 2 to 3 Hz delta slowing. Hyperventilation and photic stimulation were not performed.   ABNORMALITY - Continuous slow, generalized IMPRESSION: This study is suggestive of moderate diffuse encephalopathy, nonspecific etiology. No seizures or epileptiform discharges were seen throughout the recording. Priyanka O Yadav   VAS LOWER EXTREMITY VENOUS (DVT)  Result Date: 05/31/2020  Lower Venous DVT Study Patient Name:  Michael Wells  Date of Exam:   05/31/2020 Medical Rec #: 06/02/2020       Accession #:    Marcell Barlow Date of Birth: 07/09/1940       Patient Gender: M  Patient Age:   79Y Exam Location:  [facility] Procedure:      VAS 08/05/1940 LOWER EXTREMITY VENOUS (DVT) Referring Phys: 02-27-2005 [facility] --------------------------------------------------------------------------------  Indications: Edema.  Limitations: Body habitus, poor ultrasound/tissue interface, bandages and sunlight. Comparison Study: No prior study Performing Technologist: [place] MHA, RDMS, RVT, RDCS  Examination Guidelines: A complete evaluation includes B-mode imaging, spectral Doppler, color Doppler, and power Doppler as needed of all accessible portions of each vessel. Bilateral testing is considered an integral part of a complete examination. Limited examinations for reoccurring indications may be performed as noted. The reflux portion of the exam is performed with the patient in reverse Trendelenburg.  +---------+---------------+---------+-----------+----------+--------------+ RIGHT    CompressibilityPhasicitySpontaneityPropertiesThrombus Aging +---------+---------------+---------+-----------+----------+--------------+ FV Prox  Full                                                        +---------+---------------+---------+-----------+----------+--------------+ FV Mid   Full                                                        +---------+---------------+---------+-----------+----------+--------------+ FV DistalFull                                                        +---------+---------------+---------+-----------+----------+--------------+ POP      Full           Yes      Yes                                 +---------+---------------+---------+-----------+----------+--------------+ PTV      Full                                                        +---------+---------------+---------+-----------+----------+--------------+ PERO     Full                                                         +---------+---------------+---------+-----------+----------+--------------+   Right Technical Findings: Not visualized segments include CFV, SFJ, PFV.  +---------+---------------+---------+-----------+----------+--------------+ LEFT     CompressibilityPhasicitySpontaneityPropertiesThrombus Aging +---------+---------------+---------+-----------+----------+--------------+ CFV      Full           Yes      Yes                                 +---------+---------------+---------+-----------+----------+--------------+ SFJ      Full                                                        +---------+---------------+---------+-----------+----------+--------------+ FV Prox  Full                                                        +---------+---------------+---------+-----------+----------+--------------+  FV Mid   Full                                                        +---------+---------------+---------+-----------+----------+--------------+ FV DistalFull                                                        +---------+---------------+---------+-----------+----------+--------------+ PFV      Full                                                        +---------+---------------+---------+-----------+----------+--------------+ POP      Full           Yes      Yes                                 +---------+---------------+---------+-----------+----------+--------------+ PTV      Full                                                        +---------+---------------+---------+-----------+----------+--------------+ PERO     Full                                                        +---------+---------------+---------+-----------+----------+--------------+     Summary: RIGHT: - There is no evidence of deep vein thrombosis in the lower extremity. However, portions of this examination were limited- see technologist comments above.  - No cystic structure  found in the popliteal fossa.  LEFT: - There is no evidence of deep vein thrombosis in the lower extremity.  - No cystic structure found in the popliteal fossa.  *See table(s) above for measurements and observations. Electronically signed by Gretta Began MD on 05/31/2020 at 8:34:34 PM.    Final     Scheduled Meds: . atorvastatin  40 mg Oral Daily  . cyanocobalamin  1,000 mcg Intramuscular Weekly  . mouth rinse  15 mL Mouth Rinse BID  . vancomycin  125 mg Oral QID   Continuous Infusions: . sodium chloride 75 mL/hr at 06/02/20 1224     LOS: 12 days   Time spent: 35 minutes.  Joseph Art, DO [facility] [email or web address] 06/02/2020, 3:09 PM

## 2020-06-02 NOTE — Progress Notes (Addendum)
Received call from tele desat to 83, increased o2, SAT then 97. After being stable this nurse left and he began to desat again, RN Lovely came in placed non- rebreather on pt and pt is back up to 99 on 15L. Respiratory called and notified, they are on the way up. Will update MD.  CBG also checked it is 78.

## 2020-06-03 DIAGNOSIS — G9341 Metabolic encephalopathy: Secondary | ICD-10-CM | POA: Diagnosis not present

## 2020-06-03 DIAGNOSIS — K529 Noninfective gastroenteritis and colitis, unspecified: Secondary | ICD-10-CM | POA: Diagnosis not present

## 2020-06-03 DIAGNOSIS — Z9189 Other specified personal risk factors, not elsewhere classified: Secondary | ICD-10-CM | POA: Diagnosis not present

## 2020-06-03 DIAGNOSIS — J9601 Acute respiratory failure with hypoxia: Secondary | ICD-10-CM | POA: Diagnosis not present

## 2020-06-03 DIAGNOSIS — Z515 Encounter for palliative care: Secondary | ICD-10-CM

## 2020-06-03 LAB — BASIC METABOLIC PANEL
Anion gap: 6 (ref 5–15)
BUN: 9 mg/dL (ref 8–23)
CO2: 27 mmol/L (ref 22–32)
Calcium: 9 mg/dL (ref 8.9–10.3)
Chloride: 110 mmol/L (ref 98–111)
Creatinine, Ser: 0.69 mg/dL (ref 0.61–1.24)
GFR, Estimated: >60 mL/min (ref >60)
Glucose, Bld: 79 mg/dL (ref 70–99)
Potassium: 3.3 mmol/L — ABNORMAL LOW (ref 3.5–5.1)
Sodium: 143 mmol/L (ref 135–145)

## 2020-06-03 LAB — CBC
HCT: 32 % — ABNORMAL LOW (ref 39.0–52.0)
Hemoglobin: 10.7 g/dL — ABNORMAL LOW (ref 13.0–17.0)
MCH: 33.6 pg (ref 26.0–34.0)
MCHC: 33.4 g/dL (ref 30.0–36.0)
MCV: 100.6 fL — ABNORMAL HIGH (ref 80.0–100.0)
Platelets: 126 10*3/uL — ABNORMAL LOW (ref 150–400)
RBC: 3.18 MIL/uL — ABNORMAL LOW (ref 4.22–5.81)
RDW: 15.5 % (ref 11.5–15.5)
WBC: 11.2 10*3/uL — ABNORMAL HIGH (ref 4.0–10.5)
nRBC: 0 % (ref 0.0–0.2)

## 2020-06-03 LAB — GLUCOSE, CAPILLARY
Glucose-Capillary: 65 mg/dL — ABNORMAL LOW (ref 70–99)
Glucose-Capillary: 76 mg/dL (ref 70–99)

## 2020-06-03 MED ORDER — HALOPERIDOL LACTATE 2 MG/ML PO CONC
0.5000 mg | ORAL | Status: DC | PRN
  Filled 2020-06-03: qty 0.3

## 2020-06-03 MED ORDER — HALOPERIDOL LACTATE 5 MG/ML IJ SOLN
0.5000 mg | INTRAMUSCULAR | Status: DC | PRN
  Administered 2020-06-04: 0.5 mg via INTRAVENOUS
  Filled 2020-06-03: qty 1

## 2020-06-03 MED ORDER — LORAZEPAM 2 MG/ML IJ SOLN
1.0000 mg | INTRAMUSCULAR | Status: DC | PRN
  Administered 2020-06-04: 1 mg via INTRAVENOUS
  Filled 2020-06-03: qty 1

## 2020-06-03 MED ORDER — MORPHINE SULFATE (CONCENTRATE) 10 MG/0.5ML PO SOLN
5.0000 mg | ORAL | Status: DC | PRN
  Administered 2020-06-16: 5 mg via SUBLINGUAL
  Filled 2020-06-03 (×2): qty 0.5

## 2020-06-03 MED ORDER — LORAZEPAM 2 MG/ML PO CONC: 1.0000 mg | ORAL | Status: DC | PRN

## 2020-06-03 MED ORDER — GLYCOPYRROLATE 0.2 MG/ML IJ SOLN: 0.4000 mg | INTRAMUSCULAR | Status: DC | PRN

## 2020-06-03 MED ORDER — DEXTROSE 50 % IV SOLN
12.5000 g | Freq: Once | INTRAVENOUS | Status: AC
  Administered 2020-06-03: 12.5 g via INTRAVENOUS
  Filled 2020-06-03: qty 50

## 2020-06-03 MED ORDER — BISACODYL 10 MG RE SUPP: 10.0000 mg | Freq: Every day | RECTAL | Status: DC | PRN

## 2020-06-03 MED ORDER — MORPHINE SULFATE (PF) 2 MG/ML IV SOLN
1.0000 mg | INTRAVENOUS | Status: DC | PRN
  Administered 2020-06-03 – 2020-06-05 (×7): 1 mg via INTRAVENOUS
  Filled 2020-06-03 (×8): qty 1

## 2020-06-03 MED ORDER — POLYVINYL ALCOHOL 1.4 % OP SOLN
1.0000 [drp] | Freq: Four times a day (QID) | OPHTHALMIC | Status: DC | PRN
  Administered 2020-06-18 – 2020-06-20 (×2): 1 [drp] via OPHTHALMIC
  Filled 2020-06-03: qty 15

## 2020-06-03 MED ORDER — BIOTENE DRY MOUTH MT LIQD: 15.0000 mL | OROMUCOSAL | Status: DC | PRN

## 2020-06-03 NOTE — Progress Notes (Signed)
  Speech Language Pathology Treatment: Dysphagia  Patient Details Name: Michael Wells MRN: [id number] DOB: [birth year] Today's Date: 06/03/2020 Time: 1093-2355 SLP Time Calculation (min) (ACUTE ONLY): 34 min  Assessment / Plan / Recommendation Clinical Impression  Pt's mentation fluctuated throughout the session, initially alert but with significant signs of dysphagia (oral holding, anterior spillage, unable to suck through straw or swallow with purees, delayed cough), quickly becoming very lethargic and not taking in any POs, and ultimately becoming the most alert when his son arrived to his room. At that point, pt consumed a few sips of water and two bites of applesauce without overt signs of aspiration. Pt needs encouragement to take in even that much PO, but does verbalize that he would not want a feeding tube. Palliative care was present throughout the session and has been talking with family about overall GOC. Family verbalized wanting to be able to give pt POs when he was asking for them, so the concept of comfort feeds was discussed, including emphasis on comfort as opposed to nutrition and potential risk for aspiration particularly in light of fluctuating performance with signs concerning for aspiration. They are hesitant to provide comfort feeds when they hear there is risk involved, but it does seem to be important to them to be able to offer POs. They were left to discuss their options further with palliative care within the context of overall GOC.   If pt/family would like to pursue comfort feeds, would start with Dys 1 (puree) solids and thin liquids, offered only when pt is asking for them and when he is alert enough to try. Regardless of their decision, would place heavy emphasis on oral care to provide comfort and reduce oral bacterial load. Will continue to follow.   HPI HPI: Pt is a 80 year old male admitted 06-01-2020 with complaints of diarrhea and weakness. Dx significant colitis, C.  diff, metabolic enceophalopathy, sepsis. Hospitalization complicated by agitated delirium, intermittent hypoxia, and electrolyte derangements. Silent aspiration questioned by Dr. Jarvis Wells on 5/17 due to persistent leukocytosis. CXR 5/16: Stable left basilar atelectasis. 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      SLP Plan  Continue with current plan of care       Recommendations  Diet recommendations: NPO;Other(comment) (unless opting for comfort feeds, at which time would initiate purees and thin liquids) Medication Administration: Via alternative means                Oral Care Recommendations: Oral care QID;Oral care prior to ice chip/H20 (pt may have individual ice chips after oral care) Follow up Recommendations:  (tba) SLP Visit Diagnosis: Dysphagia, unspecified (R13.10) Plan: Continue with current plan of care       GO                Michael Wells., M.A. CCC-SLP Acute Rehabilitation Services Pager [phone] Office [phone]  06/03/2020, 10:30 AM

## 2020-06-03 NOTE — Progress Notes (Signed)
Daily Progress Note   Patient Name: VIRAJ LIBY       Date: 06/03/2020 DOB: 23-Feb-1940  Age: 80 y.o. MRN#: [id number] Attending Physician: Geradine Girt, DO Primary Care Physician: Everardo Beals, NP Admit Date: 06/02/2020  Reason for Consultation/Follow-up: Establishing goals of care  Subjective: Medical records reviewed. Assessed patient at the bedside and met with wife [name], son [name], and [place]. He is more alert and conversational today and breathing more regularly, although still lethargic at times and quite frail. Pollyann Glen SLP also at the bedside, appreciate her input and clarification of the unfortunately limited options for safe feeding.  Created space and opportunity for thoughts and feelings regarding patient's current illness. [name] is quite happy that he has been so interactive today. We reviewed SLP recommendations and I gently reminded her that overall, we are in the same position of deciding whether to proceed with artificial nutrition or comfort care. At this time, patient clearly states he would not want a feeding tube. Family is surprised but very grateful for his ability to participate in the conversation today. They have never heard him express this before.   An extensive review of a comfort care approach was then had with family. I provided my professional recommendation that a decision for comfort would be highly appropriate today. Counseled on the rationale and continued availability of support, such as symptom management and personal care. Emphasized the importance of focusing on spiritual and emotional health at this stage. Upon hearing that he would possibly be allowed to rest peacefully without further labs or testing, the patient smiles and laughs. Patient's son  [name] kindly tells the patient he will support his decisions no matter what. Patient then states he would like to hear everyone's "independent opinions" on the matter and speak up if they are in disagreement. The decision was then made to proceed with transition to comfort care.  Reviewed the limited benefit of oxygen supplementation and IV hydration for comfort. [name] has a difficult time accepting this, however [name] and Jeannetta Nap understand the importance of following the patient's lead and only providing what he requests or seems to benefit from. [name] would like patient's soft restraints kept in place to ensure patient doesn't lose IV access if he needs fluids. I again reviewed the option of comfort feeds and family verbalizes  understanding. They look forward to bringing his favorite pureed foods, such as sausage gravy.  Questions and concerns addressed. Encouraged family to continue reviewing "Hard Choices for [employer]" booklet. PMT will continue to support holistically.  Length of Stay: 13  Current Medications: Scheduled Meds:  . mouth rinse  15 mL Mouth Rinse BID  . vancomycin  125 mg Oral QID     PRN Meds: acetaminophen **OR** acetaminophen, antiseptic oral rinse, bisacodyl, glycopyrrolate **OR** glycopyrrolate, haloperidol **OR** haloperidol lactate, LORazepam **OR** LORazepam, morphine injection, morphine CONCENTRATE, ondansetron **OR** ondansetron (ZOFRAN) IV, polyvinyl alcohol, Zinc Oxide  Physical Exam Vitals and nursing note reviewed.  Constitutional:      Appearance: He is ill-appearing.  Cardiovascular:     Rate and Rhythm: Normal rate.  Pulmonary:     Effort: Pulmonary effort is normal.  Skin:    General: Skin is warm and dry.  Neurological:     Mental Status: He is alert. He is confused.  Psychiatric:        Behavior: Behavior is slowed.             Vital Signs: BP 91/71   Pulse 71   Temp 97.8 F (36.6 C) (Axillary)   Resp 18   Ht $R'5\' 7"'Bo$  (1.702 m)   Wt  77.1 kg   SpO2 96%   BMI 26.63 kg/m  SpO2: SpO2: 96 % O2 Device: O2 Device: Nasal Cannula O2 Flow Rate: O2 Flow Rate (L/min): 2 L/min  Intake/output summary:   Intake/Output Summary (Last 24 hours) at 06/03/2020 1112 Last data filed at 06/02/2020 1500 Gross per 24 hour  Intake 195 ml  Output 700 ml  Net -505 ml   LBM: Last BM Date: 06/01/20 Baseline Weight: Weight: 77.1 kg Most recent weight: Weight: 77.1 kg       Palliative Assessment/Data: 10-20%      Patient Active Problem List   Diagnosis Date Noted  . Elevated LFTs   . At risk for aspiration   . Goals of care, counseling/discussion   . Clostridium difficile colitis 05/21/2020  . Acute colitis 05/14/2020  . Sepsis ([place]) 05/27/2020  . History of embolic stroke 09/47/0962  . Carotid artery stenosis, symptomatic, right 06/08/2020  . Skin ulcer of buttock, limited to breakdown of skin ([place]) 06/11/2020  . Hyponatremia 05/29/2020  . Acute metabolic encephalopathy 83/66/2947  . Acute respiratory failure with hypoxia ([place]) 06/10/2020  . Hyperglycemia 05/29/2020  . Acute ischemic stroke ([place]) 03/10/2020  . Embolic stroke ([place]) 65/46/5035  . Chronic pain 03/09/2020    Palliative Care Assessment & Plan   Patient Profile: 80 y.o. male  with past medical history of CVA Feb '22 secondary to right carotid stenosis status post stenting, chronic low back pain, hyperlipidemia, ascending aortic aneurysm, admitted on 06/03/2020 with diarrhea and weakness. Work up showed acute c.diff colitis with concurrent sepsis.  Hospitalization complicated by agitated delirium, intermittent hypoxia, and electrolyte derangements.  Palliative care has been consulted to assist with goals of care conversation given poor prognosis.  Assessment: Acute colitis, improving Aspiration risk, unchanged History of CVA Acute metabolic encephalopathy, stable Goals of care conversation  Recommendations/Plan: Family has made decision to transition to  comfort care today Morphine PRN for pain/air hunger/comfort Robinul PRN for excessive secretions Ativan PRN for agitation/anxiety Zofran PRN for nausea Liquifilm tears PRN for dry eyes Haldol PRN for agitation/anxiety Bisacodyl PRN for constipation May have comfort feeding Comfort cart for family Unrestricted visitations in the setting of EOL (per policy) Oxygen PRN 2L  or less for comfort. No escalation. Mouth rinse/oral care PRN for comfort  Goals of Care and Additional Recommendations: Limitations on Scope of Treatment: Full Comfort Care  Code Status: DNR/DNI   Code Status Orders  (From admission, onward)         Start     Ordered   06/03/20 1059  Do not attempt resuscitation (DNR)  Continuous       Question Answer Comment  In the event of cardiac or respiratory ARREST Do not call a "code blue"   In the event of cardiac or respiratory ARREST Do not perform Intubation, CPR, defibrillation or ACLS   In the event of cardiac or respiratory ARREST Use medication by any route, position, wound care, and other measures to relive pain and suffering. May use oxygen, suction and manual treatment of airway obstruction as needed for comfort.      06/03/20 1109        Code Status History    Date Active Date Inactive Code Status Order ID Comments User Context   06/01/2020 0825 06/03/2020 1109 DNR [id number]  Geradine Girt, DO Inpatient   05/16/2020 2142 06/01/2020 0825 Full Code [id number]  Vernelle Emerald, MD ED   03/09/2020 2141 03/11/2020 2114 Full Code [id number]  Orene Desanctis, DO ED   Advance Care Planning Activity      Prognosis:  Hours - Days  Discharge Planning: Anticipated Hospital Death  Care plan was discussed with patient, family, RN Laverda Sorenson, Dr. Eliseo Squires  Thank you for allowing the Palliative Medicine Team to assist in the care of this patient.  Time in: 0945 Time out: 1055 Total time: 70 minutes Greater than 50% of this time was spent in counseling and coordinating care  related to the above assessment and plan.  Dorthy Cooler, PA-C Palliative Medicine Team Team phone # [phone]  Thank you for allowing the Palliative Medicine Team to assist in the care of this patient. Please utilize secure chat with additional questions, if there is no response within 30 minutes please call the above phone number.  Palliative Medicine Team providers are available by phone from 7am to 7pm daily and can be reached through the team cell phone.  Should this patient require assistance outside of these hours, please call the patient's attending physician.

## 2020-06-03 NOTE — Progress Notes (Signed)
PROGRESS NOTE  Michael Wells  WGY:[id number] DOB: April 11, 1940 DOA: 05/22/2020 PCP: Marva Panda, NP     Brief Narrative: Michael Wells is a 80 y.o. 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 diff testing. Urine culture also grew E. coli. The patient was admitted, started on IVF and po vancomycin with modest improvement in symptoms initially but now has declined again.  Hospitalization complicated by agitated delirium, intermittent hypoxia, and electrolyte derangements.  Poor overall prognosis.  Appears that since Sunday patient has had an overall declining trajectory.  Appreciate palliative care referral-- transition to comfort care on 5/21.  Assessment & Plan: Principal Problem:   Acute colitis Active Problems:   Sepsis (HCC)   History of embolic stroke   Skin ulcer of buttock, limited to breakdown of skin (HCC)   Hyponatremia   Acute metabolic encephalopathy   Acute respiratory failure with hypoxia (HCC)   Hyperglycemia   Clostridium difficile colitis   Elevated LFTs   At risk for aspiration   Goals of care, counseling/discussion   Severe sepsis due to C. difficile colitis:  -  on po vancomycin 125mg  QID dosing, abd exam benign,  course of 14 days.  - Persistent leukocytosis, though patient is afebrile--  - Contact precautions. - blood cultures (NGTD). -patient now transitioned to comfort care  ? GI bleed Hold blood thinners as now comfort care focused  Acute hypoxic respiratory failure due to atelectasis: CXR without infiltrate , +atelectasis.  - transitioned to comfort care  Acute metabolic encephalopathy: Suspect possible underlying vascular dementia/diminished cerebral reserve leading to agitated hospital delirium.  -transitioned to comfort care  History of embolic stroke, right carotid stenosis s/p stent Feb 2022:  -  asa/brilenta held for GI bleeding  MASD of buttocks due to frequent diarrhea:  - Barrier cream  Vitamin B12 deficiency: Level is significantly low at 86.  - Continue IM supplementation.   E. coli UTI: Unclear reliability regarding symptoms.  - Tx'd with keflex   - Blood cultures negative to date.  Ascending aortic aneurysm: Incidentally noted on CTA, 4.2cm.  - CTA or MRA recommended semiannually.  -comfort care focused  Prediabetes: HbA1c 5.8%.   Hypokalemia: repleted   Hyperbilirubinemia: Improving. RUQ U/S with cholelithiasis without biliary dilatation or evidence of inflammation.  Hyponatremia: Stable  DVT prophylaxis: scd Code Status: DNR Family Communication: wife at bedside Disposition Plan:  Status is: Inpatient   Dispo: The patient is from: Home              Anticipated d/c is to: in hospital death              Patient currently is not medically stable to d/c.    Difficult to place patient No  Consultants:   Palliative care- poor overall prognosis      Subjective: Wife at bedside-- patient distracted by mitts-- trying to remove-- wife thinks he is more awake today  Objective: Vitals:   06/02/20 1900 06/02/20 2300 06/03/20 0300 06/03/20 0758  BP:    91/71  Pulse:    71  Resp:    18  Temp: 97.7 F (36.5 C) 97.6 F (36.4 C) 97.7 F (36.5 C) 97.8 F (36.6 C)  TempSrc: Axillary Axillary Axillary Axillary  SpO2:    96%  Weight:      Height:  Intake/Output Summary (Last 24 hours) at 06/03/2020 1231 Last data filed at 06/02/2020 1500 Gross per 24 hour  Intake 195 ml  Output 700 ml  Net -505 ml   Filed Weights   06/13/20 1353  Weight: 77.1 kg   General: Appearance:     Frail male in mild respiratory distress     Lungs:     Not on [place],  Diminished breath sounds  Heart:    Normal heart rate.   MS:   All extremities are intact.   Neurologic:   Alert at times       Data Reviewed: I have personally reviewed following labs and imaging  studies  CBC: Recent Labs  Lab 05/28/20 0342 05/29/20 0350 05/30/20 0729 06/01/20 0252 06/01/20 0705 06/03/20 0324  WBC 21.6* 19.5* 18.4* 20.1*  --  11.2*  NEUTROABS  --  17.5*  --   --   --   --   HGB 14.5 13.9 13.0 11.9* 11.8* 10.7*  HCT 41.9 40.2 37.2* 35.4* 36.0* 32.0*  MCV 95.4 95.3 94.9 97.8  --  100.6*  PLT 151 168 147* 134*  --  126*   Basic Metabolic Panel: Recent Labs  Lab 05/28/20 0342 05/29/20 0350 05/30/20 0729 06/01/20 0252 06/03/20 0324  NA 137 137 140 140 143  K 3.5 3.5 3.3* 2.8* 3.3*  CL 105 106 103 106 110  CO2 23 23 28 28 27   GLUCOSE 112* 91 79 107* 79  BUN 11 13 16 13 9   CREATININE 0.72 0.82 0.92 0.78 0.69  CALCIUM 9.1 9.3 9.0 9.3 9.0  MG  --  1.7  --   --   --    GFR: Estimated Creatinine Clearance: 71.1 mL/min (by C-G formula based on SCr of 0.69 mg/dL). Liver Function Tests: Recent Labs  Lab 06/01/20 0252  AST 99*  ALT 132*  ALKPHOS 569*  BILITOT 3.0*  PROT 5.2*  ALBUMIN 1.9*   No results for input(s): LIPASE, AMYLASE in the last 168 hours. Recent Labs  Lab 05/31/20 1148  AMMONIA 38*   Coagulation Profile: No results for input(s): INR, PROTIME in the last 168 hours. Cardiac Enzymes: No results for input(s): CKTOTAL, CKMB, CKMBINDEX, TROPONINI in the last 168 hours. BNP (last 3 results) No results for input(s): PROBNP in the last 8760 hours. HbA1C: No results for input(s): HGBA1C in the last 72 hours. CBG: Recent Labs  Lab 06/02/20 1537 06/02/20 1913 06/02/20 2335 06/03/20 0314 06/03/20 0756  GLUCAP 82 86 76 65* 76   Lipid Profile: No results for input(s): CHOL, HDL, LDLCALC, TRIG, CHOLHDL, LDLDIRECT in the last 72 hours. Thyroid Function Tests: No results for input(s): TSH, T4TOTAL, FREET4, T3FREE, THYROIDAB in the last 72 hours. Anemia Panel: No results for input(s): VITAMINB12, FOLATE, FERRITIN, TIBC, IRON, RETICCTPCT in the last 72 hours. Urine analysis:    Component Value Date/Time   COLORURINE YELLOW  05/21/2020 0036   APPEARANCEUR HAZY (A) 05/21/2020 0036   LABSPEC >1.046 (H) 05/21/2020 0036   PHURINE 6.0 05/21/2020 0036   GLUCOSEU NEGATIVE 05/21/2020 0036   HGBUR NEGATIVE 05/21/2020 0036   BILIRUBINUR NEGATIVE 05/21/2020 0036   KETONESUR NEGATIVE 05/21/2020 0036   PROTEINUR NEGATIVE 05/21/2020 0036   NITRITE POSITIVE (A) 05/21/2020 0036   LEUKOCYTESUR LARGE (A) 05/21/2020 0036   No results found for this or any previous visit (from the past 240 hour(s)).    Radiology Studies: No results found.  Scheduled Meds: . mouth rinse  15 mL Mouth Rinse BID  Continuous Infusions:    LOS: 13 days   Time spent: 35 minutes.  Joseph Art, DO [facility] [email or web address] 06/03/2020, 12:31 PM

## 2020-06-04 DIAGNOSIS — G9341 Metabolic encephalopathy: Secondary | ICD-10-CM | POA: Diagnosis not present

## 2020-06-04 DIAGNOSIS — K529 Noninfective gastroenteritis and colitis, unspecified: Secondary | ICD-10-CM | POA: Diagnosis not present

## 2020-06-04 DIAGNOSIS — J9601 Acute respiratory failure with hypoxia: Secondary | ICD-10-CM | POA: Diagnosis not present

## 2020-06-04 DIAGNOSIS — Z9189 Other specified personal risk factors, not elsewhere classified: Secondary | ICD-10-CM | POA: Diagnosis not present

## 2020-06-04 NOTE — Progress Notes (Signed)
Pt  initially had orders to transfer to 6N as of  06/03/20, however , pt is rapidly declining & spoke with MD about the pt staying on the floor as it wld be inappropriate to move him at this point.

## 2020-06-04 NOTE — Progress Notes (Signed)
Nutrition Brief Note  Chart reviewed. Pt now transitioning to comfort care.  No further nutrition interventions planned at this time.  Please re-consult as needed.   Krystle Polcyn, MS, RD, LDN RD pager number and weekend/on-call pager number located in Amion.    

## 2020-06-04 NOTE — Progress Notes (Signed)
Chaplain received a call from patient's nurse that he had been placed on comfort care and family was bedside and needed the Chaplain. Chaplain entered the room and patient's wife, son and daughter-in-law were bedside. A comfort tray had been delivered to the room. Son was very emotional. Chaplain offered emotional and spiritual support and prayed for patient and family. Chaplain is available when needed.  (I rounded back at 3:30 PM to check on family. They are still bedside.)    06/04/20 1000  Clinical Encounter Type  Visited With Patient and family together  Visit Type Social support;Patient actively dying  Referral From Nurse  Consult/Referral To Chaplain

## 2020-06-04 NOTE — Progress Notes (Signed)
PROGRESS NOTE  Michael Wells  GNF:[id number] DOB: [birth year] DOA: 21-May-2020 PCP: Marva Panda, NP     Brief Narrative: Michael Wells is a 80 y.o. 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 diff testing. Urine culture also grew E. coli. The patient was admitted, started on IVF and po vancomycin with modest improvement in symptoms initially but now has declined again.  Hospitalization complicated by agitated delirium, intermittent hypoxia, and electrolyte derangements.  Poor overall prognosis.  Appears that since Sunday patient has had an overall declining trajectory.  Appreciate palliative care referral-- transition to comfort care on 5/21.   Assessment & Plan: Principal Problem:   Acute colitis Active Problems:   Sepsis (HCC)   History of embolic stroke   Skin ulcer of buttock, limited to breakdown of skin (HCC)   Hyponatremia   Acute metabolic encephalopathy   Acute respiratory failure with hypoxia (HCC)   Hyperglycemia   Clostridium difficile colitis   Elevated LFTs   At risk for aspiration   Goals of care, counseling/discussion   Palliative care by specialist   Severe sepsis due to C. difficile colitis:  -  on po vancomycin 125mg  QID dosing, abd exam benign,  course of 14 days.  - Persistent leukocytosis, though patient is afebrile--  - Contact precautions. - blood cultures (NGTD). -patient now transitioned to comfort care  ? GI bleed Hold blood thinners as now comfort care focused  Acute hypoxic respiratory failure due to atelectasis: CXR without infiltrate , +atelectasis.  - transitioned to comfort care  Acute metabolic encephalopathy: Suspect possible underlying vascular dementia/diminished cerebral reserve leading to agitated hospital delirium.  -transitioned to comfort care  History of embolic stroke, right carotid  stenosis s/p stent Feb 2022:  - asa/brilenta held for GI bleeding  MASD of buttocks due to frequent diarrhea:  - Barrier cream  Vitamin B12 deficiency: Level is significantly low at 86.  - Continue IM supplementation.   E. coli UTI: Unclear reliability regarding symptoms.  - Tx'd with keflex   - Blood cultures negative to date.  Ascending aortic aneurysm: Incidentally noted on CTA, 4.2cm.  - CTA or MRA recommended semiannually.  -comfort care focused  Prediabetes: HbA1c 5.8%.   Hypokalemia: repleted   Hyperbilirubinemia: Improving. RUQ U/S with cholelithiasis without biliary dilatation or evidence of inflammation.  Hyponatremia: Stable  DVT prophylaxis: scd Code Status: DNR Family Communication: wife at bedside 5/21 Disposition Plan:  Status is: Inpatient   Dispo: The patient is from: Home              Anticipated d/c is to: in hospital death              Patient currently is not medically stable to d/c.    Difficult to place patient No  Consultants:   Palliative care- poor overall prognosis      Subjective: No family at bedside, no overnight events  Objective: Vitals:   06/03/20 0300 06/03/20 0758 06/03/20 2217 06/04/20 0621  BP:  91/71 122/89 119/73  Pulse:  71 69 75  Resp:  18 20 20   Temp: 97.7 F (36.5 C) 97.8 F (36.6 C) 97.7 F (36.5 C) (!) 97.5 F (36.4 C)  TempSrc: Axillary Axillary Axillary Axillary  SpO2:  96%    Weight:      Height:  No intake or output data in the 24 hours ending 06/04/20 0857 Filed Weights   05/26/20 1353  Weight: 77.1 kg    General: Appearance:     frail male who appear comfortable, not responsive to voice     Lungs:      respirations unlabored  Heart:    Normal heart rate. Normal rhythm. No murmurs, rubs, or gallops.   MS:   All extremities are intact.   Neurologic:   Not following commands       Data Reviewed: I have personally reviewed following labs and imaging studies  CBC: Recent Labs  Lab  05/29/20 0350 05/30/20 0729 06/01/20 0252 06/01/20 0705 06/03/20 0324  WBC 19.5* 18.4* 20.1*  --  11.2*  NEUTROABS 17.5*  --   --   --   --   HGB 13.9 13.0 11.9* 11.8* 10.7*  HCT 40.2 37.2* 35.4* 36.0* 32.0*  MCV 95.3 94.9 97.8  --  100.6*  PLT 168 147* 134*  --  126*   Basic Metabolic Panel: Recent Labs  Lab 05/29/20 0350 05/30/20 0729 06/01/20 0252 06/03/20 0324  NA 137 140 140 143  K 3.5 3.3* 2.8* 3.3*  CL 106 103 106 110  CO2 23 28 28 27   GLUCOSE 91 79 107* 79  BUN 13 16 13 9   CREATININE 0.82 0.92 0.78 0.69  CALCIUM 9.3 9.0 9.3 9.0  MG 1.7  --   --   --    GFR: Estimated Creatinine Clearance: 71.1 mL/min (by C-G formula based on SCr of 0.69 mg/dL). Liver Function Tests: Recent Labs  Lab 06/01/20 0252  AST 99*  ALT 132*  ALKPHOS 569*  BILITOT 3.0*  PROT 5.2*  ALBUMIN 1.9*   No results for input(s): LIPASE, AMYLASE in the last 168 hours. Recent Labs  Lab 05/31/20 1148  AMMONIA 38*   Coagulation Profile: No results for input(s): INR, PROTIME in the last 168 hours. Cardiac Enzymes: No results for input(s): CKTOTAL, CKMB, CKMBINDEX, TROPONINI in the last 168 hours. BNP (last 3 results) No results for input(s): PROBNP in the last 8760 hours. HbA1C: No results for input(s): HGBA1C in the last 72 hours. CBG: Recent Labs  Lab 06/02/20 1537 06/02/20 1913 06/02/20 2335 06/03/20 0314 06/03/20 0756  GLUCAP 82 86 76 65* 76   Lipid Profile: No results for input(s): CHOL, HDL, LDLCALC, TRIG, CHOLHDL, LDLDIRECT in the last 72 hours. Thyroid Function Tests: No results for input(s): TSH, T4TOTAL, FREET4, T3FREE, THYROIDAB in the last 72 hours. Anemia Panel: No results for input(s): VITAMINB12, FOLATE, FERRITIN, TIBC, IRON, RETICCTPCT in the last 72 hours. Urine analysis:    Component Value Date/Time   COLORURINE YELLOW 05/21/2020 0036   APPEARANCEUR HAZY (A) 05/21/2020 0036   LABSPEC >1.046 (H) 05/21/2020 0036   PHURINE 6.0 05/21/2020 0036   GLUCOSEU  NEGATIVE 05/21/2020 0036   HGBUR NEGATIVE 05/21/2020 0036   BILIRUBINUR NEGATIVE 05/21/2020 0036   KETONESUR NEGATIVE 05/21/2020 0036   PROTEINUR NEGATIVE 05/21/2020 0036   NITRITE POSITIVE (A) 05/21/2020 0036   LEUKOCYTESUR LARGE (A) 05/21/2020 0036   No results found for this or any previous visit (from the past 240 hour(s)).    Radiology Studies: No results found.  Scheduled Meds: . mouth rinse  15 mL Mouth Rinse BID   Continuous Infusions:    LOS: 14 days   Time spent: 35 minutes.  07/21/2020, DO [facility] [email or web address] 06/04/2020, 8:57 AM

## 2020-06-04 NOTE — Progress Notes (Signed)
Daily Progress Note   Patient Name: Michael Wells       Date: 06/04/2020 DOB: October 22, 1940  Age: 80 y.o. MRN#: [id number] Attending Physician: Geradine Girt, DO Primary Care Physician: Everardo Beals, NP Admit Date: 05/29/2020  Reason for Consultation/Follow-up: Psychosocial/spiritual support and Terminal Care  Subjective: Medical records reviewed. Assessed patient at the bedside and met with [name].  He is unresponsive today, mouth breathing with periods of apnea. Resting and in no acute distress.  Michael Wells is finding comfort in a live stream of her church service.  Reviewed interval history since transition to comfort care yesterday.  She reiterates her gratitude for our discussion and states "he released [place]" in reference to [place] expressing his own wishes during a brief moment of rallying and clarity.  I shared this gratitude, emphasizing the importance of patient autonomy and dignity through all stages of illness.    She reports that [name] appeared comfortable for the remainder of the day and overnight as she stayed at the bedside with him.  He continues to wax and wane, but has been unresponsive since yesterday afternoon. Michael Wells also finds support from hospital chaplain to be very helpful, sharing their conversation this morning of [name] appearing to be "ready to meet God." She notes that in this exact moment she came to the realization that it was more important to allow for a peaceful death than "being selfish" and forcing interventions that were no longer beneficial.  Additional therapeutic listening and emotional support was provided.  Expectations and natural trajectories at EOL were discussed. Michael Wells is tearful but appreciative of this explanation.  Questions and concerns  addressed. PMT will continue to support holistically.  Length of Stay: 14  Current Medications: Scheduled Meds:  . mouth rinse  15 mL Mouth Rinse BID     PRN Meds: acetaminophen **OR** acetaminophen, antiseptic oral rinse, bisacodyl, glycopyrrolate **OR** glycopyrrolate, haloperidol **OR** haloperidol lactate, LORazepam **OR** LORazepam, morphine injection, morphine CONCENTRATE, ondansetron **OR** ondansetron (ZOFRAN) IV, polyvinyl alcohol, Zinc Oxide  Physical Exam Vitals and nursing note reviewed.  Constitutional:      Appearance: He is cachectic. He is ill-appearing.  Cardiovascular:     Rate and Rhythm: Normal rate.  Pulmonary:     Effort: Pulmonary effort is normal.     Comments: Apneic at times Skin:  General: Skin is cool and dry.  Neurological:     Mental Status: He is unresponsive.             Vital Signs: BP 119/73 (BP Location: Left Arm)   Pulse 75   Temp (!) 97.5 F (36.4 C) (Axillary)   Resp 20   Ht $R'5\' 7"'NB$  (1.702 m)   Wt 77.1 kg   SpO2 96%   BMI 26.63 kg/m  SpO2: SpO2: 96 % O2 Device: O2 Device: Nasal Cannula O2 Flow Rate: O2 Flow Rate (L/min): 2 L/min  Intake/output summary:  No intake or output data in the 24 hours ending 06/04/20 1225 LBM: Last BM Date: 06/01/20 Baseline Weight: Weight: 77.1 kg Most recent weight: Weight: 77.1 kg       Palliative Assessment/Data: 10%     Patient Active Problem List   Diagnosis Date Noted  . Palliative care by specialist   . Elevated LFTs   . At risk for aspiration   . Goals of care, counseling/discussion   . Clostridium difficile colitis 05/21/2020  . Acute colitis 06/08/2020  . Sepsis ([place]) 05/16/2020  . History of embolic stroke 31/54/0086  . Carotid artery stenosis, symptomatic, right 05/18/2020  . Skin ulcer of buttock, limited to breakdown of skin ([place]) 05/25/2020  . Hyponatremia 05/22/2020  . Acute metabolic encephalopathy 76/19/5093  . Acute respiratory failure with hypoxia ([place]) 05/25/2020   . Hyperglycemia 05/17/2020  . Acute ischemic stroke ([place]) 03/10/2020  . Embolic stroke ([place]) 26/71/2458  . Chronic pain 03/09/2020    Palliative Care Assessment & Plan   Patient Profile: 80 y.o. male  with past medical history of CVA Feb '22 secondary to right carotid stenosis status post stenting, chronic low back pain, hyperlipidemia, ascending aortic aneurysm, admitted on 06/07/2020 with diarrhea and weakness. Work up showed acute c.diff colitis with concurrent sepsis.  Hospitalization complicated by agitated delirium, intermittent hypoxia, and electrolyte derangements.  Palliative care has been consulted to assist with goals of care conversation given poor prognosis.  Assessment: End of life care  Recommendations/Plan: Psychosocial and emotional support provided Continue medications for comfort as needed PMT will continue to support and ensure patient remains comfortable  Goals of Care and Additional Recommendations: Limitations on Scope of Treatment: Full Comfort Care  Code Status: DNR/DNI   Code Status Orders  (From admission, onward)         Start     Ordered   06/03/20 1059  Do not attempt resuscitation (DNR)  Continuous       Question Answer Comment  In the event of cardiac or respiratory ARREST Do not call a "code blue"   In the event of cardiac or respiratory ARREST Do not perform Intubation, CPR, defibrillation or ACLS   In the event of cardiac or respiratory ARREST Use medication by any route, position, wound care, and other measures to relive pain and suffering. May use oxygen, suction and manual treatment of airway obstruction as needed for comfort.      06/03/20 1109        Code Status History    Date Active Date Inactive Code Status Order ID Comments User Context   06/01/2020 0825 06/03/2020 1109 DNR [id number]  Geradine Girt, DO Inpatient   06/09/2020 2142 06/01/2020 0825 Full Code [id number]  Vernelle Emerald, MD ED   03/09/2020 2141 03/11/2020 2114 Full  Code [id number]  Orene Desanctis, DO ED   Advance Care Planning Activity      Prognosis:  Hours -  Days  Discharge Planning: Anticipated Hospital Death  Care plan was discussed with patient's wife Michael Wells   Total time: 25 minutes Greater than 50% of this time was spent in counseling and coordinating care related to the above assessment and plan.  Dorthy Cooler, PA-C Palliative Medicine Team Team phone # [phone]  Thank you for allowing the Palliative Medicine Team to assist in the care of this patient. Please utilize secure chat with additional questions, if there is no response within 30 minutes please call the above phone number.  Palliative Medicine Team providers are available by phone from 7am to 7pm daily and can be reached through the team cell phone.  Should this patient require assistance outside of these hours, please call the patient's attending physician.

## 2020-06-04 NOTE — Progress Notes (Signed)
Report attempted.  Advised call wld be returned.

## 2020-06-05 DIAGNOSIS — K529 Noninfective gastroenteritis and colitis, unspecified: Secondary | ICD-10-CM | POA: Diagnosis not present

## 2020-06-05 NOTE — Progress Notes (Signed)
   06/05/20 1115  Clinical Encounter Type  Visited With Patient and family together  Visit Type Follow-up  Referral From Nurse  Consult/Referral To Chaplain  The patient's wife, [name], and friends were at the bedside. Ms. [name] stated she is still believing God for the patient's health to be changed. She also stated she is at peace with God if he does not. She knows the patient is not fighting and now he's DNR. She realizes he does not want to be a burden on his family, but they have been together since teenage years, and she does not want to be without him. This chaplain perceives that Ms. [name] still wants the patient to not give up and for the medical team to give him their best care. This chaplain assured her the medical team would give their best care. This chaplain offered prayer and advised we remain available if needed. This note was prepared by Deneen Harts, M.Div..  For questions please contact by phone [phone].

## 2020-06-05 NOTE — Progress Notes (Signed)
SLP Cancellation Note  Patient Details Name: PHILMORE LEPORE MRN: [id number] DOB: 06/15/1940   Cancelled treatment:       Reason Eval/Treat Not Completed: Other (comment) (Pt has been transitioned to comfort care. SLP will sig off at this time.)  Raynette Arras [name], MS, CCC-SLP Acute Rehabilitation Services Office number [phone] Pager [phone]  Scheryl Marten 06/05/2020, 8:21 AM

## 2020-06-05 NOTE — Care Management Important Message (Signed)
Important Message  Patient Details  Name: CARMEN VALLECILLO MRN: [id number] Date of Birth: [birth year]   Medicare Important Message Given:  Yes left IM letter at desk W/NS to give to pt.pt on Enteric precautions.    Sloan Leiter Smith 06/05/2020, 1:18 PM

## 2020-06-05 NOTE — Progress Notes (Signed)
PROGRESS NOTE  Michael Wells  DVV:[id number] DOB: 07/02/1940 DOA: 05/15/2020 PCP: Marva Panda, NP     Brief Narrative: Michael Wells is a 80 y.o. 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 diff testing. Urine culture also grew E. coli. The patient was admitted, started on IVF and po vancomycin with modest improvement in symptoms initially but now has declined again.  Hospitalization complicated by agitated delirium, intermittent hypoxia, and electrolyte derangements.  Poor overall prognosis.  Appears that since Sunday patient has had an overall declining trajectory.  Appreciate palliative care referral-- transition to comfort care on 5/21.   Assessment & Plan: Principal Problem:   Acute colitis Active Problems:   Sepsis (HCC)   History of embolic stroke   Skin ulcer of buttock, limited to breakdown of skin (HCC)   Hyponatremia   Acute metabolic encephalopathy   Acute respiratory failure with hypoxia (HCC)   Hyperglycemia   Clostridium difficile colitis   Elevated LFTs   At risk for aspiration   End of life care   Palliative care by specialist   Severe sepsis due to C. difficile colitis:  -  on po vancomycin 125mg  QID dosing, abd exam benign,  course of 14 days.  - Persistent leukocytosis, though patient is afebrile--  - Contact precautions. - blood cultures (NGTD). -patient now transitioned to comfort care  ? GI bleed Hold blood thinners as now comfort care focused  Acute hypoxic respiratory failure due to atelectasis: CXR without infiltrate , +atelectasis.  - transitioned to comfort care  Acute metabolic encephalopathy: Suspect possible underlying vascular dementia/diminished cerebral reserve leading to agitated hospital delirium.  -transitioned to comfort care  History of embolic stroke, right carotid stenosis s/p stent Feb  2022:  - asa/brilenta held for GI bleeding  MASD of buttocks due to frequent diarrhea:  - Barrier cream  Vitamin B12 deficiency: Level is significantly low at 86.  - Continue IM supplementation.   E. coli UTI: Unclear reliability regarding symptoms.  - Tx'd with keflex   - Blood cultures negative to date.  Ascending aortic aneurysm: Incidentally noted on CTA, 4.2cm.  - CTA or MRA recommended semiannually.  -comfort care focused  Prediabetes: HbA1c 5.8%.   Hypokalemia: repleted   Hyperbilirubinemia: Improving. RUQ U/S with cholelithiasis without biliary dilatation or evidence of inflammation.  Hyponatremia: Stable  DVT prophylaxis: scd Code Status: DNR Family Communication: wife at bedside 5/21 Disposition Plan:  Status is: Inpatient   Dispo: The patient is from: Home              Anticipated d/c is to: in hospital death              Patient currently is not medically stable to d/c.    Difficult to place patient No  Consultants:   Palliative care- poor overall prognosis      Subjective: In bed, wearing mittens, will open eyes to voice  Objective: Vitals:   06/03/20 0758 06/03/20 2217 06/04/20 0621 06/05/20 0805  BP: 91/71 122/89 119/73 121/66  Pulse: 71 69 75 (!) 102  Resp: 18 20 20 18   Temp: 97.8 F (36.6 C) 97.7 F (36.5 C) (!) 97.5 F (36.4 C) 98.2 F (36.8 C)  TempSrc: Axillary Axillary Axillary Axillary  SpO2: 96%   98%  Weight:      Height:  Intake/Output Summary (Last 24 hours) at 06/05/2020 1052 Last data filed at 06/05/2020 0327 Gross per 24 hour  Intake --  Output 350 ml  Net -350 ml   Filed Weights   06-16-2020 1353  Weight: 77.1 kg    General: Appearance:     Frail male in no acute distress   Opens eyes to voice and touch         Data Reviewed: I have personally reviewed following labs and imaging studies  CBC: Recent Labs  Lab 05/30/20 0729 06/01/20 0252 06/01/20 0705 06/03/20 0324  WBC 18.4* 20.1*  --  11.2*   HGB 13.0 11.9* 11.8* 10.7*  HCT 37.2* 35.4* 36.0* 32.0*  MCV 94.9 97.8  --  100.6*  PLT 147* 134*  --  126*   Basic Metabolic Panel: Recent Labs  Lab 05/30/20 0729 06/01/20 0252 06/03/20 0324  NA 140 140 143  K 3.3* 2.8* 3.3*  CL 103 106 110  CO2 28 28 27   GLUCOSE 79 107* 79  BUN 16 13 9   CREATININE 0.92 0.78 0.69  CALCIUM 9.0 9.3 9.0   GFR: Estimated Creatinine Clearance: 71.1 mL/min (by C-G formula based on SCr of 0.69 mg/dL). Liver Function Tests: Recent Labs  Lab 06/01/20 0252  AST 99*  ALT 132*  ALKPHOS 569*  BILITOT 3.0*  PROT 5.2*  ALBUMIN 1.9*   No results for input(s): LIPASE, AMYLASE in the last 168 hours. Recent Labs  Lab 05/31/20 1148  AMMONIA 38*   Coagulation Profile: No results for input(s): INR, PROTIME in the last 168 hours. Cardiac Enzymes: No results for input(s): CKTOTAL, CKMB, CKMBINDEX, TROPONINI in the last 168 hours. BNP (last 3 results) No results for input(s): PROBNP in the last 8760 hours. HbA1C: No results for input(s): HGBA1C in the last 72 hours. CBG: Recent Labs  Lab 06/02/20 1537 06/02/20 1913 06/02/20 2335 06/03/20 0314 06/03/20 0756  GLUCAP 82 86 76 65* 76   Lipid Profile: No results for input(s): CHOL, HDL, LDLCALC, TRIG, CHOLHDL, LDLDIRECT in the last 72 hours. Thyroid Function Tests: No results for input(s): TSH, T4TOTAL, FREET4, T3FREE, THYROIDAB in the last 72 hours. Anemia Panel: No results for input(s): VITAMINB12, FOLATE, FERRITIN, TIBC, IRON, RETICCTPCT in the last 72 hours. Urine analysis:    Component Value Date/Time   COLORURINE YELLOW 05/21/2020 0036   APPEARANCEUR HAZY (A) 05/21/2020 0036   LABSPEC >1.046 (H) 05/21/2020 0036   PHURINE 6.0 05/21/2020 0036   GLUCOSEU NEGATIVE 05/21/2020 0036   HGBUR NEGATIVE 05/21/2020 0036   BILIRUBINUR NEGATIVE 05/21/2020 0036   KETONESUR NEGATIVE 05/21/2020 0036   PROTEINUR NEGATIVE 05/21/2020 0036   NITRITE POSITIVE (A) 05/21/2020 0036   LEUKOCYTESUR LARGE  (A) 05/21/2020 0036   No results found for this or any previous visit (from the past 240 hour(s)).    Radiology Studies: No results found.  Scheduled Meds: . mouth rinse  15 mL Mouth Rinse BID   Continuous Infusions:    LOS: 15 days   Time spent: 35 minutes.  07/21/2020, DO [facility] [email or web address] 06/05/2020, 10:52 AM

## 2020-06-06 DIAGNOSIS — Z9189 Other specified personal risk factors, not elsewhere classified: Secondary | ICD-10-CM | POA: Diagnosis not present

## 2020-06-06 DIAGNOSIS — R531 Weakness: Secondary | ICD-10-CM

## 2020-06-06 DIAGNOSIS — K529 Noninfective gastroenteritis and colitis, unspecified: Secondary | ICD-10-CM | POA: Diagnosis not present

## 2020-06-06 DIAGNOSIS — Z8673 Personal history of transient ischemic attack (TIA), and cerebral infarction without residual deficits: Secondary | ICD-10-CM | POA: Diagnosis not present

## 2020-06-06 DIAGNOSIS — G9341 Metabolic encephalopathy: Secondary | ICD-10-CM | POA: Diagnosis not present

## 2020-06-06 NOTE — Progress Notes (Signed)
PT Cancellation Note  Patient Details Name: Michael Wells MRN: [id number] DOB: [birth year]   Cancelled Treatment:    Reason Eval/Treat Not Completed: Other (comment). Noted patient has transitioned to comfort care; will sign off. Please reconsult if new needs arise.  Ina Homes, PT, DPT Acute Rehabilitation Services  Pager [phone] Office [phone]  Malachy Chamber 06/06/2020, 7:48 AM

## 2020-06-06 NOTE — Progress Notes (Signed)
PROGRESS NOTE  Michael Wells  TDV:[id number] DOB: 1940-09-28 DOA: 06/06/2020 PCP: Marva Panda, NP     Brief Narrative: Michael Wells is a 80 y.o. 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 diff testing. Urine culture also grew E. coli. The patient was admitted, started on IVF and po vancomycin with modest improvement in symptoms initially but now has declined again.  Hospitalization complicated by agitated delirium, intermittent hypoxia, and electrolyte derangements.  Poor overall prognosis.  Appears that since the previous Sunday patient has had an overall declining trajectory.  Appreciate palliative care referral-- transitioned to comfort care on 5/21.   Assessment & Plan: Principal Problem:   Acute colitis Active Problems:   Sepsis (HCC)   History of embolic stroke   Skin ulcer of buttock, limited to breakdown of skin (HCC)   Hyponatremia   Acute metabolic encephalopathy   Acute respiratory failure with hypoxia (HCC)   Hyperglycemia   Clostridium difficile colitis   Elevated LFTs   At risk for aspiration   End of life care   Palliative care by specialist   Severe sepsis due to C. difficile colitis:  -patient now transitioned to comfort care  ? GI bleed Hold blood thinners as now comfort care focused  Acute hypoxic respiratory failure due to atelectasis: CXR without infiltrate , +atelectasis.  - transitioned to comfort care  Acute metabolic encephalopathy: Suspect possible underlying vascular dementia/diminished cerebral reserve leading to agitated hospital delirium.  -transitioned to comfort care  History of embolic stroke, right carotid stenosis s/p stent Feb 2022:  - asa/brilenta held for GI bleeding  MASD of buttocks due to frequent diarrhea:  - Barrier cream  Vitamin B12 deficiency: Level is significantly low at 86.   - Continue IM supplementation.   E. coli UTI: Unclear reliability regarding symptoms.  - Tx'd with keflex   - Blood cultures negative to date.  Ascending aortic aneurysm: Incidentally noted on CTA, 4.2cm.  - CTA or MRA recommended semiannually.  -comfort care focused  Prediabetes: HbA1c 5.8%.   Hypokalemia: repleted   Hyperbilirubinemia: Improving. RUQ U/S with cholelithiasis without biliary dilatation or evidence of inflammation.  Hyponatremia: Stable  DVT prophylaxis: scd Code Status: DNR Family Communication: wife at bedside 5/24 Disposition Plan:  Status is: Inpatient   Dispo: The patient is from: Home              Anticipated d/c is to: in hospital death              Patient currently is not medically stable to d/c.    Difficult to place patient No  Consultants:   Palliative care- poor overall prognosis      Subjective: In bed, wife at bedside, minimally reponsive  Objective: Vitals:   06/03/20 2217 06/04/20 0621 06/05/20 0805 06/06/20 0400  BP: 122/89 119/73 121/66 138/73  Pulse: 69 75 (!) 102 (!) 101  Resp: 20 20 18 18   Temp: 97.7 F (36.5 C) (!) 97.5 F (36.4 C) 98.2 F (36.8 C) 97.7 F (36.5 C)  TempSrc: Axillary Axillary Axillary Oral  SpO2:   98%   Weight:      Height:        Intake/Output Summary (Last 24 hours) at 06/06/2020 0927 Last data filed at 06/06/2020 0539 Gross per 24 hour  Intake --  Output 1000 ml  Net -  1000 ml   Filed Weights   06-07-2020 1353  Weight: 77.1 kg   Appears comfortable In bed     Data Reviewed: I have personally reviewed following labs and imaging studies  CBC: Recent Labs  Lab 06/01/20 0252 06/01/20 0705 06/03/20 0324  WBC 20.1*  --  11.2*  HGB 11.9* 11.8* 10.7*  HCT 35.4* 36.0* 32.0*  MCV 97.8  --  100.6*  PLT 134*  --  126*   Basic Metabolic Panel: Recent Labs  Lab 06/01/20 0252 06/03/20 0324  NA 140 143  K 2.8* 3.3*  CL 106 110  CO2 28 27  GLUCOSE 107* 79  BUN 13 9  CREATININE  0.78 0.69  CALCIUM 9.3 9.0   GFR: Estimated Creatinine Clearance: 71.1 mL/min (by C-G formula based on SCr of 0.69 mg/dL). Liver Function Tests: Recent Labs  Lab 06/01/20 0252  AST 99*  ALT 132*  ALKPHOS 569*  BILITOT 3.0*  PROT 5.2*  ALBUMIN 1.9*   No results for input(s): LIPASE, AMYLASE in the last 168 hours. Recent Labs  Lab 05/31/20 1148  AMMONIA 38*   Coagulation Profile: No results for input(s): INR, PROTIME in the last 168 hours. Cardiac Enzymes: No results for input(s): CKTOTAL, CKMB, CKMBINDEX, TROPONINI in the last 168 hours. BNP (last 3 results) No results for input(s): PROBNP in the last 8760 hours. HbA1C: No results for input(s): HGBA1C in the last 72 hours. CBG: Recent Labs  Lab 06/02/20 1537 06/02/20 1913 06/02/20 2335 06/03/20 0314 06/03/20 0756  GLUCAP 82 86 76 65* 76   Lipid Profile: No results for input(s): CHOL, HDL, LDLCALC, TRIG, CHOLHDL, LDLDIRECT in the last 72 hours. Thyroid Function Tests: No results for input(s): TSH, T4TOTAL, FREET4, T3FREE, THYROIDAB in the last 72 hours. Anemia Panel: No results for input(s): VITAMINB12, FOLATE, FERRITIN, TIBC, IRON, RETICCTPCT in the last 72 hours. Urine analysis:    Component Value Date/Time   COLORURINE YELLOW 05/21/2020 0036   APPEARANCEUR HAZY (A) 05/21/2020 0036   LABSPEC >1.046 (H) 05/21/2020 0036   PHURINE 6.0 05/21/2020 0036   GLUCOSEU NEGATIVE 05/21/2020 0036   HGBUR NEGATIVE 05/21/2020 0036   BILIRUBINUR NEGATIVE 05/21/2020 0036   KETONESUR NEGATIVE 05/21/2020 0036   PROTEINUR NEGATIVE 05/21/2020 0036   NITRITE POSITIVE (A) 05/21/2020 0036   LEUKOCYTESUR LARGE (A) 05/21/2020 0036   No results found for this or any previous visit (from the past 240 hour(s)).    Radiology Studies: No results found.  Scheduled Meds: . mouth rinse  15 mL Mouth Rinse BID   Continuous Infusions:    LOS: 16 days   Time spent: 15 minutes.  Joseph Art, DO [facility] [email or web address] 06/06/2020, 9:27 AM

## 2020-06-06 NOTE — Progress Notes (Signed)
Daily Progress Note   Patient Name: Michael Wells       Date: 06/06/2020 DOB: 12/28/1940  Age: 80 y.o. MRN#: [id number] Attending Physician: Joseph Art, DO Primary Care Physician: Marva Panda, NP Admit Date: 2020-05-27  Reason for Consultation/Follow-up: end of life care, symptom management  Subjective: Patient is alert and oriented to person and place. His speech is somewhat slow, but it is mostly understandable. He denies pain. I note that he has 2 meal trays in the room that are untouched - I offered him food/drink but he states he is not hungry. He is able to tell me he has been in the hospital for almost 3 weeks and expresses he wants to go home.   I note that he remains on HFNC at 7 liters - decreased this to 4 liters.   19:55--Spoke with wife [name] by phone. She reports that patient was alert and interactive yesterday when she was visiting - she states this is "encouraging". Discussed that his mental status seems to wax and wane, as he was unresponsive 2 days ago. Encouraged [name] to enjoy the times when he is more alert and interactive. Gently discussed that his overall prognosis remains very poor. Linda verbalizes understanding and confirms that the goal of care is comfort measures only. Emotional support provided.   Discussed with [name] the option to transfer to residential hospice. Provided education on the benefits of resdential hospice, including 24/7 nursing care, symptom management, and a more peaceful environment for EOL. [name] is adamant that she is not interested in hospice care, stating "she can't handle it".   Length of Stay: 16  Current Medications: Scheduled Meds:  . mouth rinse  15 mL Mouth Rinse BID     PRN Meds: acetaminophen **OR** acetaminophen,  antiseptic oral rinse, bisacodyl, glycopyrrolate **OR** glycopyrrolate, haloperidol **OR** haloperidol lactate, LORazepam **OR** LORazepam, morphine injection, morphine CONCENTRATE, ondansetron **OR** ondansetron (ZOFRAN) IV, polyvinyl alcohol, Zinc Oxide  Physical Exam Vitals reviewed.  Constitutional:      General: He is not in acute distress.    Appearance: He is ill-appearing.  Pulmonary:     Effort: Pulmonary effort is normal.  Neurological:     Mental Status: He is alert.     Motor: Weakness present.     Comments: Oriented to person and place  Vital Signs: BP 138/73 (BP Location: Right Arm)   Pulse (!) 101   Temp 97.7 F (36.5 C) (Oral)   Resp 18   Ht 5\' 7"  (1.702 m)   Wt 77.1 kg   SpO2 98%   BMI 26.63 kg/m  SpO2: SpO2: 98 % O2 Flow Rate: O2 Flow Rate (L/min): 5 L/min  Intake/output summary:   Intake/Output Summary (Last 24 hours) at 06/06/2020 1202 Last data filed at 06/06/2020 06/08/2020 Gross per 24 hour  Intake --  Output 1000 ml  Net -1000 ml   LBM: Last BM Date: 06/01/20 Baseline Weight: Weight: 77.1 kg Most recent weight: Weight: 77.1 kg       Palliative Assessment/Data: PPS 10-20%      Patient Active Problem List   Diagnosis Date Noted  . Palliative care by specialist   . Elevated LFTs   . At risk for aspiration   . End of life care   . Clostridium difficile colitis 05/21/2020  . Acute colitis 05/28/2020  . Sepsis (HCC) 06/13/2020  . History of embolic stroke 06/11/2020  . Carotid artery stenosis, symptomatic, right 06/07/2020  . Skin ulcer of buttock, limited to breakdown of skin (HCC) 06/10/2020  . Hyponatremia 05/21/2020  . Acute metabolic encephalopathy 06/09/2020  . Acute respiratory failure with hypoxia (HCC) 05/29/2020  . Hyperglycemia 06/05/2020  . Acute ischemic stroke (HCC) 03/10/2020  . 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 Chronic pain 03/09/2020    Palliative Care Assessment & Plan   Patient Profile: 80  y.o.malewith past medical history of CVA Feb '22secondary to right carotid stenosis status post stenting, chronic low back pain, hyperlipidemia, ascending aortic aneurysm,admitted on 5/7/2022with diarrhea and weakness.Work up showed acute c.diff colitis with concurrent sepsis.Hospitalization complicated by agitated delirium, intermittent hypoxia, and electrolyte derangements.  Palliative care has been consulted to assist with goals of care conversation given poor prognosis.  Assessment: - acute C. diff colitis - sepsis - history of embolic stroke - acute metabolic encephalopathy - acute respiratory failure with hypoxia - aspiration risk - end of life care  Recommendations/Plan: Continue full comfort measures DNR/DNI as previously documented Nursing to assess and administer PRN meds as clinically appropriate to ensure EOL comfort Please wean patient to room air Patient's wife declines residential hospice PMT will continue to follow  Goals of Care and Additional Recommendations: Limitations on Scope of Treatment: Full Comfort Care  Code Status: DNR/DNI  Prognosis: Days - weeks?  Discharge Planning: Anticipated Hospital Death  Care plan was discussed with Dr. 07/20/2020  Thank you for allowing the Palliative Medicine Team to assist in the care of this patient.   Total Time 25 minutes Prolonged Time Billed  no       Greater than 50%  of this time was spent counseling and coordinating care related to the above assessment and plan.  Benjamine Mola, NP  Please contact Palliative Medicine Team phone at [phone] for questions and concerns.

## 2020-06-06 NOTE — Progress Notes (Signed)
OT Cancellation Note  Patient Details Name: Michael Wells MRN: [id number] DOB: 16-May-1940   Cancelled Treatment:    Reason Eval/Treat Not Completed: Other (comment). Noted patient has transitioned to comfort care; will sign off. Please reconsult if new needs arise.  Margaretmary Eddy [facility] 06/06/2020, 3:03 PM

## 2020-06-07 DIAGNOSIS — G9341 Metabolic encephalopathy: Secondary | ICD-10-CM | POA: Diagnosis not present

## 2020-06-07 DIAGNOSIS — J9601 Acute respiratory failure with hypoxia: Secondary | ICD-10-CM | POA: Diagnosis not present

## 2020-06-07 DIAGNOSIS — K529 Noninfective gastroenteritis and colitis, unspecified: Secondary | ICD-10-CM | POA: Diagnosis not present

## 2020-06-07 DIAGNOSIS — Z9189 Other specified personal risk factors, not elsewhere classified: Secondary | ICD-10-CM | POA: Diagnosis not present

## 2020-06-07 NOTE — Progress Notes (Signed)
PROGRESS NOTE  DUVALL COMES  HER:[id number] DOB: [birth year] DOA: 05/18/2020 PCP: Marva Panda, NP  Brief Narrative: PARAM CAPRI is a 80 y.o. 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 diff testing. Urine culture also grew E. coli. The patient was admitted, started on IVF and po vancomycin with modest improvement in symptoms, though weakness remains significant for which SNF is recommended. Hospitalization complicated by agitated delirium, intermittent hypoxia, and electrolyte derangements.  Assessment & Plan: Principal Problem:   Acute colitis Active Problems:   Sepsis (HCC)   History of embolic stroke   Skin ulcer of buttock, limited to breakdown of skin (HCC)   Hyponatremia   Acute metabolic encephalopathy   Acute respiratory failure with hypoxia (HCC)   Hyperglycemia   Clostridium difficile colitis   Elevated LFTs   At risk for aspiration   End of life care   Palliative care by specialist  Severe sepsis due to C. difficile colitis:  - Comfort measures - Contact precautions.  Acute hypoxic respiratory failure due to atelectasis: CXR without infiltrate or pulmonary edema, +atelectasis.  - Comfort measures  Acute metabolic encephalopathy: Suspect possible underlying vascular dementia/diminished cerebral reserve leading to agitated hospital delirium. Dehydration and severe sepsis also causative. TSH 0.995, folic acid wnl.  - Delirium precautions, comfort measures  History of embolic stroke, right carotid stenosis s/p stent Feb 2022:  - Continue aspirin, brilinta, statin  MASD of buttocks due to frequent diarrhea:  - Barrier cream  Vitamin B12 deficiency: Level is significantly low at 86. Was supplemented IM.  E. coli UTI:  Treated with keflex    Ascending aortic aneurysm: Incidentally noted on CTA, 4.2cm.  - CTA or MRA was  recommended semiannually, though this is not currently planned based on palliative goals of care.   Prediabetes: HbA1c 5.8%.   Hypokalemia: Repleted.  Hyperbilirubinemia: Improving. RUQ U/S with cholelithiasis without biliary dilatation or evidence of inflammation.  Hyponatremia: Stable  DVT prophylaxis: SCDs Code Status: Full Family Communication: Wife at bedside Disposition Plan:  Status is: Inpatient  Remains inpatient appropriate because:Unsafe d/c plan. I discussed the rationale for transferring to a residential hospice, though the name itself puts the patient's wife off the idea, she is amenable to checking it out. I believe he's a good candidate for this and will continue to encourage that unless he becomes too unstable for transfer.  Dispo: The patient is from: Home              Anticipated d/c is to: Residential hospice              Patient currently is medically stable to d/c.  Consultants:   Palliative care  Procedures:   None  Antimicrobials:  Oral vancomycin  Ceftriaxone 5/11 - 5/13   Subjective: No acute events overnight. Pt will take some po when offered. Pulled out his IV this PM.   Objective: Vitals:   06/04/20 0621 06/05/20 0805 06/06/20 0400 06/06/20 2000  BP: 119/73 121/66 138/73 137/83  Pulse: 75 (!) 102 (!) 101 71  Resp: 20 18 18 18   Temp: (!) 97.5 F (36.4 C) 98.2 F (36.8 C) 97.7 F (36.5 C) 97.6 F (36.4 C)  TempSrc: Axillary Axillary Oral Oral  SpO2:  98%  98%  Weight:      Height:        Intake/Output  Summary (Last 24 hours) at 06/07/2020 1842 Last data filed at 06/07/2020 1238 Gross per 24 hour  Intake 236 ml  Output --  Net 236 ml   Filed Weights   2020/06/19 1353  Weight: 77.1 kg   Gen: Frail elderly male in no distress Pulm: Nonlabored, clear. CV: RRR, no JVD GI: Soft, NT, ND, +BS Ext: Trace pitting LE edema Skin: Warm, dry. No new rashes on visualized skin today.   Neuro: Tracks, is more alert when engaged but  doesn't always follow commands. Moves all extremities.  Psych: Judgement and insight appear impaired, frequently falls back to sleep.     LOS: 17 days   Time spent: 25 minutes.  Tyrone Nine, MD [facility] [email or web address] 06/07/2020, 6:42 PM

## 2020-06-07 NOTE — Progress Notes (Signed)
Pt noted with iv lying on side of bed. MD notified with a request to leave out. MD ok to leave out. Pt had loose dark brown BM. Pt bathed by this nurse and c.n.a. Mary with complete bed and linen change. External catheter places-condom 44mm afterwards. Pt wife remains at pt bedside. No distress noted.

## 2020-06-08 DIAGNOSIS — G9341 Metabolic encephalopathy: Secondary | ICD-10-CM | POA: Diagnosis not present

## 2020-06-08 DIAGNOSIS — Z9189 Other specified personal risk factors, not elsewhere classified: Secondary | ICD-10-CM | POA: Diagnosis not present

## 2020-06-08 DIAGNOSIS — K529 Noninfective gastroenteritis and colitis, unspecified: Secondary | ICD-10-CM | POA: Diagnosis not present

## 2020-06-08 DIAGNOSIS — J9601 Acute respiratory failure with hypoxia: Secondary | ICD-10-CM | POA: Diagnosis not present

## 2020-06-08 NOTE — TOC Progression Note (Signed)
Transition of Care [facility]) - Progression Note    Patient Details  Name: Michael Wells MRN: [id number] Date of Birth: [birth year]  Transition [facility]) CM/SW Contact  Lorri Frederick, LCSW Phone Number: 06/08/2020, 11:45 AM  Clinical Narrative:   Per MD, pt wife visited [facility] but does not want to pursue transfer there.      Expected Discharge Plan: Skilled Nursing Facility Barriers to Discharge: Continued Medical Work up,SNF Pending bed offer  Expected Discharge Plan and Services Expected Discharge Plan: Skilled Nursing Facility     Post Acute Care Choice: Skilled Nursing Facility Living arrangements for the past 2 months: Single Family Home Expected Discharge Date: 06/08/20                                     Social Determinants of Health (SDOH) Interventions    Readmission Risk Interventions No flowsheet data found.

## 2020-06-08 NOTE — Progress Notes (Signed)
PROGRESS NOTE  Michael Wells  UEA:[id number] DOB: Dec 26, 1940 DOA: 06/02/2020 PCP: Marva Panda, NP  Brief Narrative: Michael Wells is a 80 y.o. 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 diff testing. Urine culture also grew E. coli. The patient was admitted, started on IVF and po vancomycin with modest improvement in symptoms, though weakness remains significant for which SNF is recommended. Hospitalization complicated by agitated delirium, intermittent hypoxia, and electrolyte derangements. Palliative care was consulted due to failure to improve with suspected poor outcome and goals of care were transitioned to purely comfort. The patient and wife decline transfer to hospice at this time.   Assessment & Plan: Principal Problem:   Acute colitis Active Problems:   Sepsis (HCC)   History of embolic stroke   Skin ulcer of buttock, limited to breakdown of skin (HCC)   Hyponatremia   Acute metabolic encephalopathy   Acute respiratory failure with hypoxia (HCC)   Hyperglycemia   Clostridium difficile colitis   Elevated LFTs   At risk for aspiration   End of life care   Palliative care by specialist  Severe sepsis due to C. difficile colitis:  - Comfort measures - Contact precautions.  Acute hypoxic respiratory failure due to atelectasis: CXR without infiltrate or pulmonary edema, +atelectasis.  - Comfort measures  Acute metabolic encephalopathy: Suspect possible underlying vascular dementia/diminished cerebral reserve leading to agitated hospital delirium. Dehydration and severe sepsis also causative. TSH 0.995, folic acid wnl.  - Delirium precautions, comfort measures  History of embolic stroke, right carotid stenosis s/p stent Feb 2022:  - DC aspirin, brilinta, statin based on goals of care.  MASD of buttocks due to frequent diarrhea:  -  Barrier cream  Vitamin B12 deficiency: Level is significantly low at 86. Was supplemented IM.  E. coli UTI:  Treated with keflex    Ascending aortic aneurysm: Incidentally noted on CTA, 4.2cm.  - CTA or MRA was recommended semiannually, though this is not currently planned based on palliative goals of care.   Prediabetes: HbA1c 5.8%.   Hypokalemia: Repleted.  Hyperbilirubinemia: Improving. RUQ U/S with cholelithiasis without biliary dilatation or evidence of inflammation.  Hyponatremia: Stable  DVT prophylaxis: None, comfort measures Code Status: Full Family Communication: Wife at bedside Disposition Plan:  Status is: Inpatient  Remains inpatient appropriate because:Unsafe d/c plan. Pt not felt to be appropriate for discharge anywhere but residential hospice though this is currently declined by patient and wife. We will transfer to 6N Palliative floor.  Dispo: The patient is from: Home              Anticipated d/c is to: Residential hospice vs. in-hospital death expected.              Patient currently is medically stable to d/c.  Consultants:   Palliative care  Procedures:   None  Antimicrobials:  Oral vancomycin  Ceftriaxone 5/11 - 5/13   Subjective: Drinking water and milk but not eating much. No dyspnea or pain reported. Remains intermittently alert but quickly drowses.    Objective: Vitals:   06/06/20 0400 06/06/20 2000 06/07/20 1912 06/08/20 0602  BP: 138/73 137/83 112/77 (!) 115/96  Pulse: (!) 101 71 (!) 110 (!) 132  Resp: 18 18 18 20   Temp: 97.7 F (36.5 C) 97.6 F (36.4 C) 98.1 F (36.7 C) 97.6 F (36.4 C)  TempSrc: Oral Oral Oral Oral  SpO2:  98% 97% 100%  Weight:      Height:       No intake or output data in the 24 hours ending 06/08/20 1424 Filed Weights   06-15-2020 1353  Weight: 77.1 kg   Gen: Frail elderly male relaxing peacefully Pulm: Clear, nonlabored CV: RRR, 1+ LE edema GI: Soft, NT, ND, +BS Ext: No deformity Skin: Cool and  dry without new rashes on visualized skin today.   Neuro: Rousable and interactive but quickly returns to somnolence, follows commands moves all extremities.  Psych: Calm.      LOS: 18 days   Time spent: 25 minutes.  Tyrone Nine, MD [email or web address] 06/08/2020, 2:24 PM

## 2020-06-09 DIAGNOSIS — R197 Diarrhea, unspecified: Secondary | ICD-10-CM | POA: Diagnosis not present

## 2020-06-09 DIAGNOSIS — J9601 Acute respiratory failure with hypoxia: Secondary | ICD-10-CM | POA: Diagnosis not present

## 2020-06-09 DIAGNOSIS — Z9189 Other specified personal risk factors, not elsewhere classified: Secondary | ICD-10-CM | POA: Diagnosis not present

## 2020-06-09 DIAGNOSIS — G9341 Metabolic encephalopathy: Secondary | ICD-10-CM | POA: Diagnosis not present

## 2020-06-09 DIAGNOSIS — K529 Noninfective gastroenteritis and colitis, unspecified: Secondary | ICD-10-CM | POA: Diagnosis not present

## 2020-06-09 MED ORDER — SACCHAROMYCES BOULARDII 250 MG PO CAPS
250.0000 mg | ORAL_CAPSULE | Freq: Two times a day (BID) | ORAL | Status: DC
  Administered 2020-06-09 – 2020-06-17 (×16): 250 mg via ORAL
  Filled 2020-06-09 (×18): qty 1

## 2020-06-09 MED ORDER — LOPERAMIDE HCL 2 MG PO CAPS
2.0000 mg | ORAL_CAPSULE | Freq: Four times a day (QID) | ORAL | Status: DC
  Administered 2020-06-09 – 2020-06-11 (×10): 2 mg via ORAL
  Filled 2020-06-09 (×10): qty 1

## 2020-06-09 MED ORDER — PSYLLIUM 95 % PO PACK
1.0000 | PACK | Freq: Every day | ORAL | Status: DC
  Administered 2020-06-09 – 2020-06-15 (×7): 1 via ORAL
  Filled 2020-06-09 (×7): qty 1

## 2020-06-09 NOTE — TOC Progression Note (Signed)
Transition of Care [facility]) - Progression Note    Patient Details  Name: IKTAN AIKMAN MRN: [id number] Date of Birth: 1940/05/04  Transition of Care [facility]) CM/SW Contact  Jimmy Picket, [place] Phone Number: 06/09/2020, 4:27 PM  Clinical Narrative:     CSW received message that pt was improving and wife is interested in SNF. Pts wife stated she still wants pt to go to [facility]. CSW called [facility] and they stated they will have a bed next week. CSW informed them they will need to start insurance auth for the pt. Pt will need a covid test proir to DC.   Expected Discharge Plan: Skilled Nursing Facility Barriers to Discharge: Continued Medical Work up,SNF Pending bed offer  Expected Discharge Plan and Services Expected Discharge Plan: Skilled Nursing Facility     Post Acute Care Choice: Skilled Nursing Facility Living arrangements for the past 2 months: Single Family Home Expected Discharge Date: 06/08/20                                     Social Determinants of Health (SDOH) Interventions    Readmission Risk Interventions No flowsheet data found.  Jimmy Picket, Theresia Majors, [place] Clinical Social Worker [phone]

## 2020-06-09 NOTE — Progress Notes (Signed)
Daily Progress Note   Patient Name: Michael Wells       Date: 06/09/2020 DOB: 07-15-1940  Age: 80 y.o. MRN#: [id number] Attending Physician: Patrecia Pour, MD Primary Care Physician: Everardo Beals, NP Admit Date: 06/10/2020  Reason for Consultation/Follow-up: goals of care symptom management  Subjective: Chart reviewed. Discussed with bedside RN - she reports patient is having increasingly watery stools. She is concerned about skin breakdown on his bottom. She reports his meal intake was 40% yesterday, but only 10% today (she thinks he didn't like what was on his tray today).   I went to bedside to see patient. He is alert, pleasant, and interactive. He has no acute complaints.   14:50--Spoke with Dr. Bonner Puna and case management via secure chat. Discussed that patient has been alert and interactive for the past few days that I had been following him. He seems overall to have stabilized since he was placed on comfort measures on 5/21. Discussed that his oral intake is not great, but is enough to sustain him possibly for months.   I later met with wife Michael Wells in the conference room on 6N. Discussed that patient is somewhat improved or at least stabilized and not imminently at EOL. Discussed disposition options; SNF/rehab versus home with hospice. Emphasized that the focus of rehab is improvement or at least stabilization of functional status, where the focus of hospice is comfort and quality of life within the setting of end-stage illness. Discussed that his prognosis is likely less than 6 months and he would be eligible for home hospice - unfortunately Michael Wells is unable to care for at home in his current condition. She wants Michael Wells to start receiving PT to give him a change to regain some functional  status. She is optimistic for improvement and ultimately would want to have him return home if possible.  Discussed the possibility that Donat may not improve or continue to decline while in rehab, in which case she may be faced with long-term SNF placement if he was not well enough to return home.    Length of Stay: 19  Current Medications: Scheduled Meds:  . loperamide  2 mg Oral QID  . mouth rinse  15 mL Mouth Rinse BID  . psyllium  1 packet Oral Daily  . saccharomyces boulardii  250 mg Oral  BID      PRN Meds: acetaminophen **OR** acetaminophen, antiseptic oral rinse, bisacodyl, glycopyrrolate **OR** glycopyrrolate, haloperidol **OR** haloperidol lactate, LORazepam **OR** LORazepam, morphine injection, morphine CONCENTRATE, ondansetron **OR** ondansetron (ZOFRAN) IV, polyvinyl alcohol, Zinc Oxide  Physical Exam Vitals reviewed.  Constitutional:      General: He is not in acute distress.    Appearance: He is ill-appearing.  Pulmonary:     Effort: Pulmonary effort is normal.  Neurological:     Mental Status: He is alert.     Motor: Weakness present.             Vital Signs: BP 99/68 (BP Location: Left Arm)   Pulse (!) 55   Temp 97.8 F (36.6 C)   Resp 17   Ht $R'5\' 7"'Bq$  (1.702 m)   Wt 77.1 kg   SpO2 96%   BMI 26.63 kg/m  SpO2: SpO2: 96 % O2 Device: O2 Device: Room Air  Intake/output summary:   Intake/Output Summary (Last 24 hours) at 06/09/2020 1426 Last data filed at 06/09/2020 1100 Gross per 24 hour  Intake 250 ml  Output 300 ml  Net -50 ml   LBM: Last BM Date: 06/08/20 Baseline Weight: Weight: 77.1 kg Most recent weight: Weight: 77.1 kg       Palliative Assessment/Data: PPS 30%      Palliative Care Assessment & Plan   Patient Profile: 80 y.o.malewith past medical history of CVA Feb '22secondary to right carotid stenosis status post stenting, chronic low back pain, hyperlipidemia, ascending aortic aneurysm,admitted on 5/7/2022with diarrhea and  weakness.Work up showed acute c.diff colitis with concurrent sepsis.Hospitalization complicated by agitated delirium, intermittent hypoxia, and electrolyte derangements.  Palliative care has been consulted to assist with goals of care conversation given poor prognosis.  Assessment: - acute C. diff colitis - sepsis - history of embolic stroke - acute metabolic encephalopathy - acute respiratory failure with hypoxia - aspiration risk - generalized weakness  Recommendations/Plan:  Continue current symptom-based care  DNR/DNI as previously documented  Patient has stabilized and is not imminently at EOL   PT to reassess if patient appropriate for SNF/rehab  Wife is optimistic that patient may be able to ultimately return home after rehab - if so, would recommend home hospice  PMT will continue to follow  Symptom Management:   Florastor 250 mg twice daily for diarrhea  Metamucil 1 packet daily - if it causes diarrhea to worsen, then stop  Goals of Care and Additional Recommendations:  Limitations on Scope of Treatment: no artifical feeding  Code Status: DNR  Prognosis:  Less than 6 months  Discharge Planning:  To be determined  Care plan was discussed with Dr. Bonner Puna, case management, CSW, RN  Thank you for allowing the Palliative Medicine Team to assist in the care of this patient.   Total Time 35 minutes Prolonged Time Billed no      Greater than 50%  of this time was spent counseling and coordinating care related to the above assessment and plan.  Lavena Bullion, NP  Please contact Palliative Medicine Team phone at [phone] for questions and concerns.

## 2020-06-09 NOTE — Progress Notes (Signed)
PROGRESS NOTE  Michael Wells  KGM:[id number] DOB: Sep 22, 1940 DOA: 05/14/2020 PCP: Michael Panda, Michael Wells  Brief Narrative: Michael Wells is a 80 y.o. 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 diff testing. Urine culture also grew E. coli. The patient was admitted, started on IVF and po vancomycin with modest improvement in symptoms, though weakness remains significant for which SNF is recommended. Hospitalization complicated by agitated delirium, intermittent hypoxia, and electrolyte derangements. Palliative care was consulted due to failure to improve with suspected poor outcome and goals of care were transitioned to purely comfort. The patient and wife decline transfer to hospice at this time.   Assessment & Plan: Principal Problem:   Acute colitis Active Problems:   Sepsis (HCC)   History of embolic stroke   Skin ulcer of buttock, limited to breakdown of skin (HCC)   Hyponatremia   Acute metabolic encephalopathy   Acute respiratory failure with hypoxia (HCC)   Hyperglycemia   Clostridium difficile colitis   Elevated LFTs   At risk for aspiration   End of life care   Palliative care by specialist  Severe sepsis due to C. difficile colitis:  - Comfort measures - Contact precautions. - Will discuss risk and benefits of antimotility agents with family with aim to first do no harm we've been avoiding these in setting of bacterial colitis, though he's comfort measures and they may wish to risk toxic megacolon, etc. In the meantime, urge RN to treat that discomfort with PO or SL morphine.  Acute hypoxic respiratory failure due to atelectasis: CXR without infiltrate or pulmonary edema, +atelectasis.  - Comfort measures  Acute metabolic encephalopathy: Suspect possible underlying vascular dementia/diminished cerebral reserve leading to agitated  hospital delirium. Dehydration and severe sepsis also causative. TSH 0.995, folic acid wnl.  - Delirium precautions, comfort measures  History of embolic stroke, right carotid stenosis s/p stent Feb 2022:  - DC aspirin, brilinta, statin based on goals of care.  MASD of buttocks due to frequent diarrhea:  - Barrier cream  Vitamin B12 deficiency: Level is significantly low at 86. Was supplemented IM.  E. coli UTI:  Treated with keflex    Ascending aortic aneurysm: Incidentally noted on CTA, 4.2cm.  - CTA or MRA was recommended semiannually, though this is not currently planned based on palliative goals of care.   Prediabetes: HbA1c 5.8%.   Hypokalemia: Repleted.  Hyperbilirubinemia: Improving. RUQ U/S with cholelithiasis without biliary dilatation or evidence of inflammation.  Hyponatremia: Stable  DVT prophylaxis: None, comfort measures Code Status: Full Family Communication: Wife at bedside Disposition Plan:  Status is: Inpatient  Remains inpatient appropriate because:Unsafe d/c plan. Pt not felt to be appropriate for discharge anywhere but residential hospice though this is currently declined by patient and wife. We will transfer to 6N Palliative floor.  Dispo: The patient is from: Home              Anticipated d/c is to: Residential hospice vs. in-hospital death expected.              Patient currently is medically stable to d/c.  Consultants:   Palliative care  Procedures:   None  Antimicrobials:  Oral vancomycin  Ceftriaxone 5/11 - 5/13   Subjective: No events overnight, has moved to 6N. Has mushy stools that are still frequent and very painful on excoriated perirectal  skin.  Objective: Vitals:   06/08/20 0602 06/08/20 1502 06/08/20 1855 06/09/20 0557  BP: (!) 115/96 125/79 124/72 99/68  Pulse: (!) 132 (!) 120 77 (!) 55  Resp: 20 16 16 17   Temp: 97.6 F (36.4 C) 98.3 F (36.8 C) 98.1 F (36.7 C) 97.8 F (36.6 C)  TempSrc: Oral Oral Axillary   SpO2:  100% 98% 100% 96%  Weight:      Height:        Intake/Output Summary (Last 24 hours) at 06/09/2020 1053 Last data filed at 06/09/2020 0915 Gross per 24 hour  Intake 150 ml  Output 200 ml  Net -50 ml   Filed Weights   05/31/2020 1353  Weight: 77.1 kg   Gen: Frail, elderly male resting quietly Pulm: Clear, nonlabored CV: RRR GI: Soft, not tend, not distended. Ext: No deformities Skin: Cool and dry. Neuro: Rousable but not very interactive, moves all extremities. Psych: Calm     LOS: 19 days   Time spent: 25 minutes.  07/20/20, MD [email or web address] 06/09/2020, 10:53 AM

## 2020-06-10 DIAGNOSIS — K529 Noninfective gastroenteritis and colitis, unspecified: Secondary | ICD-10-CM | POA: Diagnosis not present

## 2020-06-10 DIAGNOSIS — J9601 Acute respiratory failure with hypoxia: Secondary | ICD-10-CM | POA: Diagnosis not present

## 2020-06-10 DIAGNOSIS — Z9189 Other specified personal risk factors, not elsewhere classified: Secondary | ICD-10-CM | POA: Diagnosis not present

## 2020-06-10 DIAGNOSIS — A0472 Enterocolitis due to Clostridium difficile, not specified as recurrent: Secondary | ICD-10-CM | POA: Diagnosis not present

## 2020-06-10 DIAGNOSIS — R197 Diarrhea, unspecified: Secondary | ICD-10-CM | POA: Diagnosis not present

## 2020-06-10 DIAGNOSIS — G9341 Metabolic encephalopathy: Secondary | ICD-10-CM | POA: Diagnosis not present

## 2020-06-10 NOTE — Plan of Care (Signed)
  Problem: Health Behavior/Discharge Planning: Goal: Ability to manage health-related needs will improve Outcome: Progressing   

## 2020-06-10 NOTE — Progress Notes (Signed)
PROGRESS NOTE  [name]  LOV:[id number] DOB: 1940/09/12 DOA: 05/24/2020 PCP: Marva Panda, NP  Brief Narrative: Michael Wells is a 80 y.o. 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 diff testing. Urine culture also grew E. coli. The patient was admitted, started on IVF and po vancomycin with modest improvement in symptoms, though weakness remains significant for which SNF is recommended. Hospitalization complicated by agitated delirium, intermittent hypoxia, and electrolyte derangements. Palliative care was consulted due to failure to improve with suspected poor outcome and goals of care were transitioned to purely comfort. The patient and wife decline transfer to hospice at this time.   Assessment & Plan: Principal Problem:   Acute colitis Active Problems:   Sepsis (HCC)   History of embolic stroke   Skin ulcer of buttock, limited to breakdown of skin (HCC)   Hyponatremia   Acute metabolic encephalopathy   Acute respiratory failure with hypoxia (HCC)   Hyperglycemia   Clostridium difficile colitis   Elevated LFTs   At risk for aspiration   End of life care   Palliative care by specialist  Severe sepsis due to C. difficile colitis:  - Continue antimotility agent as he wishes for relief from diarrhea and accepts risk of worsening colitis.  - Contact precautions. - Since we're heading back toward a "treat the treatable" pathway, will check labs, likely to restart medications depending on clinical course.  Acute hypoxic respiratory failure due to atelectasis: CXR without infiltrate or pulmonary edema, +atelectasis.  - Comfort measures  Acute metabolic encephalopathy: Suspect possible underlying vascular dementia/diminished cerebral reserve leading to agitated hospital delirium. Dehydration and severe sepsis also causative. TSH 0.995, folic  acid wnl.  - Delirium precautions, comfort measures  History of embolic stroke, right carotid stenosis s/p stent Feb 2022:  - DC aspirin, brilinta, statin based on goals of care.  MASD of buttocks due to frequent diarrhea:  - Barrier cream  Vitamin B12 deficiency: Level is significantly low at 86. Was supplemented IM.  E. coli UTI:  Treated with keflex    Ascending aortic aneurysm: Incidentally noted on CTA, 4.2cm.  - CTA or MRA was recommended semiannually, though this is not currently planned based on palliative goals of care.   Prediabetes: HbA1c 5.8%.   Hypokalemia: Repleted.  Hyperbilirubinemia: Improving. RUQ U/S with cholelithiasis without biliary dilatation or evidence of inflammation.  Hyponatremia: Stable  DVT prophylaxis: None, comfort measures Code Status: Full Family Communication: None at bedside today Disposition Plan:  Status is: Inpatient  Remains inpatient appropriate because:Unsafe d/c plan. Will pursue SNF rehabilitation with plan to return to purely palliative course if not progressing.  Dispo: The patient is from: Home              Anticipated d/c is to: SNF w/hospice services. PT reevaluation requested              Patient currently is medically stable to d/c.  Consultants:   Palliative care  Procedures:   None  Antimicrobials:  Oral vancomycin  Ceftriaxone 5/11 - 5/13   Subjective: Denies pain currently, has been sleeping a fair amount today but eating better. He's amenable to attempt at rehabilitation.  Objective: Vitals:   06/08/20 1855 06/09/20 0557 06/09/20 1943 06/10/20 1107  BP: 124/72 99/68 103/68 (!) 110/53  Pulse: 77 (!) 55 (!) 53 (!) 53  Resp: 16 17 17 17   Temp: 98.1 F (36.7 C) 97.8 F (36.6 C) 97.8 F (36.6 C)   TempSrc: Axillary  Oral   SpO2: 100% 96% 95% 97%  Weight:      Height:        Intake/Output Summary (Last 24 hours) at 06/10/2020 1425 Last data filed at 06/10/2020 1100 Gross per 24 hour  Intake 577 ml   Output 280 ml  Net 297 ml   Filed Weights   05/29/2020 1353  Weight: 77.1 kg   Gen: Frail, elderly man resting quietly Pulm: Nonlabored and clear CV: RRR GI: Soft, +BS, NT ND. Ext: LE edema R > L is stable.  Skin: Warm, dry throughout Neuro: Rousable and interactive, very slow speech but appropriate.  Psych: Calm     LOS: 20 days   Time spent: 25 minutes.  07/20/20, MD [email or web address] 06/10/2020, 2:25 PM

## 2020-06-10 NOTE — Plan of Care (Signed)

## 2020-06-10 NOTE — Progress Notes (Signed)
Daily Progress Note   Patient Name: Michael Wells       Date: 06/10/2020 DOB: 01-08-1941  Age: 80 y.o. MRN#: [id number] Attending Physician: Tyrone Nine, MD Primary Care Physician: Marva Panda, NP Admit Date: 05/30/2020  Reason for Consultation/Follow-up: goals of care, symptom management, disposition  Subjective: Patient is alert and pleasant with no acute complaints. He seems somewhat less oriented today, not recalling why he is the hospital. He is surprised when I tell him he has been hospitalized for 3 weeks. I note his lunch tray is in the room untouched. I offer to assist [name] with eating, but he declines stating he is not "hungry".   Length of Stay: 20  Current Medications: Scheduled Meds:  . loperamide  2 mg Oral QID  . mouth rinse  15 mL Mouth Rinse BID  . psyllium  1 packet Oral Daily  . saccharomyces boulardii  250 mg Oral BID     PRN Meds: acetaminophen **OR** acetaminophen, antiseptic oral rinse, bisacodyl, morphine CONCENTRATE, ondansetron **OR** ondansetron (ZOFRAN) IV, polyvinyl alcohol, Zinc Oxide  Physical Exam Vitals reviewed.  Constitutional:      General: He is not in acute distress.    Comments: Chronically ill-appearing  Pulmonary:     Effort: Pulmonary effort is normal.  Neurological:     Mental Status: He is alert.     Motor: Weakness present.             Vital Signs: BP (!) 110/53 (BP Location: Left Arm)   Pulse (!) 53   Temp 97.8 F (36.6 C) (Oral)   Resp 17   Ht 5\' 7"  (1.702 m)   Wt 77.1 kg   SpO2 97%   BMI 26.63 kg/m  SpO2: SpO2: 97 % O2 Device: O2 Device: Room Air  Intake/output summary:   Intake/Output Summary (Last 24 hours) at 06/10/2020 1423 Last data filed at 06/10/2020 1100 Gross per 24 hour  Intake 577 ml   Output 280 ml  Net 297 ml   LBM: Last BM Date: 06/10/20 Baseline Weight: Weight: 77.1 kg Most recent weight: Weight: 77.1 kg       Palliative Assessment/Data: PPS 30%      Palliative Care Assessment & Plan   Patient Profile: 80 y.o.malewith past medical history of CVA Feb '22secondary to right carotid  stenosis status post stenting, chronic low back pain, hyperlipidemia, ascending aortic aneurysm,admitted on 5/7/2022with diarrhea and weakness.Work up showed acute c.diff colitis with concurrent sepsis.Hospitalization complicated by agitated delirium, intermittent hypoxia, and electrolyte derangements. Palliative care has been consulted to assist with goals of care conversation given poor prognosis.  Assessment: - acute C. diff colitis - sepsis - history of embolic stroke - acute metabolic encephalopathy - acute respiratory failure with hypoxia - aspiration risk - generalized weakness  Recommendations/Plan:  Continue current symptom-based care  DNR/DNI as previously documented  Patient has stabilized and is not imminently at EOL   Discontinued all comfort medication, with the exception of morphine solution as needed for pain  PT evaluation ordered to reassess if patient appropriate for SNF/rehab  Wife is optimistic that patient may be able to ultimately return home after rehab - if so, would recommend home hospice  PMT will continue to follow   Goals of Care and Additional Recommendations:  Limitations on Scope of Treatment: no artificial feeding  Code Status: DNR  Prognosis:  Months, less than 6 months  Discharge Planning:  To be determined  Care plan was discussed with Dr. Jarvis Newcomer  Thank you for allowing the Palliative Medicine Team to assist in the care of this patient.   Total Time 15 minutes Prolonged Time Billed  no      Greater than 50%  of this time was spent counseling and coordinating care related to the above assessment and  plan.  Merry Proud, NP  Please contact Palliative Medicine Team phone at [phone] for questions and concerns.

## 2020-06-11 DIAGNOSIS — K529 Noninfective gastroenteritis and colitis, unspecified: Secondary | ICD-10-CM | POA: Diagnosis not present

## 2020-06-11 DIAGNOSIS — Z9189 Other specified personal risk factors, not elsewhere classified: Secondary | ICD-10-CM | POA: Diagnosis not present

## 2020-06-11 DIAGNOSIS — G9341 Metabolic encephalopathy: Secondary | ICD-10-CM | POA: Diagnosis not present

## 2020-06-11 DIAGNOSIS — J9601 Acute respiratory failure with hypoxia: Secondary | ICD-10-CM | POA: Diagnosis not present

## 2020-06-11 LAB — BASIC METABOLIC PANEL
Anion gap: 6 (ref 5–15)
BUN: 8 mg/dL (ref 8–23)
CO2: 33 mmol/L — ABNORMAL HIGH (ref 22–32)
Calcium: 8.4 mg/dL — ABNORMAL LOW (ref 8.9–10.3)
Chloride: 102 mmol/L (ref 98–111)
Creatinine, Ser: 0.75 mg/dL (ref 0.61–1.24)
GFR, Estimated: >60 mL/min (ref >60)
Glucose, Bld: 102 mg/dL — ABNORMAL HIGH (ref 70–99)
Potassium: 2.4 mmol/L — CL (ref 3.5–5.1)
Sodium: 141 mmol/L (ref 135–145)

## 2020-06-11 LAB — CBC
HCT: 33.5 % — ABNORMAL LOW (ref 39.0–52.0)
Hemoglobin: 11 g/dL — ABNORMAL LOW (ref 13.0–17.0)
MCH: 32.5 pg (ref 26.0–34.0)
MCHC: 32.8 g/dL (ref 30.0–36.0)
MCV: 99.1 fL (ref 80.0–100.0)
Platelets: 80 10*3/uL — ABNORMAL LOW (ref 150–400)
RBC: 3.38 MIL/uL — ABNORMAL LOW (ref 4.22–5.81)
RDW: 15.2 % (ref 11.5–15.5)
WBC: 7.2 10*3/uL (ref 4.0–10.5)
nRBC: 0 % (ref 0.0–0.2)

## 2020-06-11 LAB — MAGNESIUM: Magnesium: 1.8 mg/dL (ref 1.7–2.4)

## 2020-06-11 MED ORDER — ATORVASTATIN CALCIUM 40 MG PO TABS
40.0000 mg | ORAL_TABLET | Freq: Every day | ORAL | Status: DC
  Administered 2020-06-11 – 2020-06-16 (×7): 40 mg via ORAL
  Filled 2020-06-11 (×6): qty 1

## 2020-06-11 MED ORDER — POTASSIUM CHLORIDE 10 MEQ/100ML IV SOLN
10.0000 meq | INTRAVENOUS | Status: AC
  Administered 2020-06-11 (×4): 10 meq via INTRAVENOUS
  Filled 2020-06-11 (×4): qty 100

## 2020-06-11 MED ORDER — ASPIRIN EC 81 MG PO TBEC
81.0000 mg | DELAYED_RELEASE_TABLET | Freq: Every day | ORAL | Status: DC
  Administered 2020-06-11 – 2020-06-15 (×5): 81 mg via ORAL
  Filled 2020-06-11 (×6): qty 1

## 2020-06-11 MED ORDER — MAGNESIUM SULFATE 2 GM/50ML IV SOLN
2.0000 g | Freq: Once | INTRAVENOUS | Status: AC
  Administered 2020-06-11: 2 g via INTRAVENOUS
  Filled 2020-06-11: qty 50

## 2020-06-11 MED ORDER — CYANOCOBALAMIN 1000 MCG/ML IJ SOLN
1000.0000 ug | INTRAMUSCULAR | Status: DC
  Administered 2020-06-12: 1000 ug via INTRAMUSCULAR
  Filled 2020-06-11: qty 1

## 2020-06-11 MED ORDER — ENOXAPARIN SODIUM 40 MG/0.4ML IJ SOSY
40.0000 mg | PREFILLED_SYRINGE | INTRAMUSCULAR | Status: DC
  Administered 2020-06-11 – 2020-06-15 (×5): 40 mg via SUBCUTANEOUS
  Filled 2020-06-11 (×5): qty 0.4

## 2020-06-11 MED ORDER — TICAGRELOR 90 MG PO TABS
90.0000 mg | ORAL_TABLET | Freq: Two times a day (BID) | ORAL | Status: DC
  Administered 2020-06-11 – 2020-06-16 (×10): 90 mg via ORAL
  Filled 2020-06-11 (×11): qty 1

## 2020-06-11 MED ORDER — POTASSIUM CHLORIDE 10 MEQ/100ML IV SOLN
10.0000 meq | INTRAVENOUS | Status: AC
  Administered 2020-06-11 (×6): 10 meq via INTRAVENOUS
  Filled 2020-06-11 (×4): qty 100

## 2020-06-11 NOTE — Progress Notes (Signed)
PROGRESS NOTE  Michael Wells  ZMO:[id number] DOB: [birth year] DOA: 06/10/20 PCP: Michael Panda, NP  Brief Narrative: Michael Wells is a 80 y.o. 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 diff testing. Urine culture also grew E. coli. The patient was admitted, started on IVF and po vancomycin with modest improvement in symptoms, though weakness remains significant for which SNF is recommended. Hospitalization complicated by agitated delirium, intermittent hypoxia, and electrolyte derangements. Palliative care was consulted due to failure to improve with suspected poor outcome and goals of care were transitioned to purely comfort. The patient and wife decline transfer to hospice at this time.   Assessment & Plan: Principal Problem:   Acute colitis Active Problems:   Sepsis (HCC)   History of embolic stroke   Skin ulcer of buttock, limited to breakdown of skin (HCC)   Hyponatremia   Acute metabolic encephalopathy   Acute respiratory failure with hypoxia (HCC)   Hyperglycemia   Clostridium difficile colitis   Elevated LFTs   At risk for aspiration   End of life care   Palliative care by specialist  Severe sepsis due to C. difficile colitis:  - Continue antimotility agent as he wishes for relief from diarrhea and accepts risk of worsening colitis.  - Contact precautions while still having diarrhea.  Acute hypoxic respiratory failure due to atelectasis: CXR without infiltrate or pulmonary edema, +atelectasis.  - Resolved.  Acute metabolic encephalopathy: Suspect possible underlying vascular dementia/diminished cerebral reserve leading to agitated hospital delirium. Dehydration and severe sepsis also causative. TSH 0.995, folic acid wnl.  - Delirium precautions, comfort measures  History of embolic stroke, right carotid stenosis s/p stent Feb  2022:  - Restart aspirin, brilinta, statin based on goals of care.  MASD of buttocks due to frequent diarrhea:  - Barrier cream, imodium.  Vitamin B12 deficiency: Level is significantly low at 86. Was supplemented IM. Will continue weekly IM supplementation for now.   E. coli UTI:  Treated with keflex    Ascending aortic aneurysm: Incidentally noted on CTA, 4.2cm.  - CTA or MRA was recommended semiannually   Prediabetes: HbA1c 5.8%.   Hypokalemia:  - Rechecked after conversion back out of comfort measures, very low. Will replace by IV if possible to avoid risk of aspirating such last and caustic medication.   Hyperbilirubinemia: Improving. RUQ U/S with cholelithiasis without biliary dilatation or evidence of inflammation.  Hyponatremia: Stable  DVT prophylaxis: Lovenox Code Status: Full Family Communication: None at bedside today Disposition Plan:  Status is: Inpatient  Remains inpatient appropriate because:Unsafe d/c plan. Will pursue SNF rehabilitation with plan to return to purely palliative course if not progressing.  Dispo: The patient is from: Home              Anticipated d/c is to: SNF w/hospice services              Patient currently is medically stable to d/c.  Consultants:   Palliative care  Procedures:   None  Antimicrobials:  Oral vancomycin  Ceftriaxone 5/11 - 5/13   Subjective: Tired from working with PT, is happy to continue working with PT here and at SNF if it is in the hopes of going home eventually. PT feels his rehab potential is there. No BMs yet today per pt and RN.  Objective: Vitals:   06/10/20 1107  06/10/20 2235 06/11/20 0524 06/11/20 1611  BP: (!) 110/53 110/60 117/69 110/64  Pulse: (!) 53 68 62 64  Resp: 17 17  18   Temp:  97.8 F (36.6 C) 97.8 F (36.6 C) 98.1 F (36.7 C)  TempSrc:  Oral  Axillary  SpO2: 97% 96% 100% 97%  Weight:      Height:        Intake/Output Summary (Last 24 hours) at 06/11/2020 1616 Last data filed  at 06/11/2020 1613 Gross per 24 hour  Intake 290 ml  Output 925 ml  Net -635 ml   Filed Weights   2020/06/05 1353  Weight: 77.1 kg   Gen: Frail elderly man in no distress in chair Pulm: Clear, nonlabored CV: RRR, 1+ LE edema GI: Soft, NT, ND +BS Ext: No deformities.  Skin: No rashes or ulcers on visualized skin Neuro: Alert, interacts more than previously, disoriented. Moves all extremities slowly Psych: Calm. Euthymic.     LOS: 21 days   Time spent: 25 minutes.  07/20/20, MD [email or web address] 06/11/2020, 4:16 PM

## 2020-06-11 NOTE — Evaluation (Signed)
Physical Therapy Evaluation Patient Details Name: Michael Wells MRN: [id number] DOB: 1940-03-06 Today's Date: 06/11/2020   History of Present Illness  Pt is a 80 y.o. male who presented to the ED 5/7 with diarrhea and weakness found to have colitis on CT and positive C. diff testing. Urine culture also grew E. coli. The patient was admitted, started on IVF and po vancomycin with modest improvement in symptoms initially but now has declined again.  Hospitalization complicated by agitated delirium, intermittent hypoxia, and electrolyte derangements.  Poor overall prognosis. 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  Pt was transitioned to comfort care on 5/21 and is now showing some increased alertness.  MD/Family asked PT to reassess for rehab potential in hopes patient can return home.  Clinical Impression  Patient presents with increased alertness from previous therapy sessions.  Patient presents with dependencies in all mobility, currently requiring max assistance transfers.  I do feel patient has the potential to progress to mod-min assistance if he continues to participate in therapy.  I recommend PT follow while in hospital and patient go to SNF for continued therapy to progress transfers, gait and mobility.      Follow Up Recommendations SNF;Supervision/Assistance - 24 hour    Equipment Recommendations  Wheelchair (measurements PT);Wheelchair cushion (measurements PT);Hospital bed    Recommendations for Other Services       Precautions / Restrictions Precautions Precautions: Fall      Mobility  Bed Mobility Overal bed mobility: Needs Assistance Bed Mobility: Supine to Sit     Supine to sit: Mod assist;+2 for physical assistance     General bed mobility comments: able to advance legs toward edge of bed with assistance; assist to lift shoulders off bed and maintain upright on edge of bed; tended to  posterior lean    Transfers Overall transfer level: Needs assistance Equipment used: Rolling walker (2 wheeled) Transfers: Sit to/from [employer] Sit to Stand: Max assist;+2 physical assistance   Squat pivot transfers: Max assist     General transfer comment: unable to reach full upright even with +2 assistance; performed bed-chair transfer  Ambulation/Gait                Stairs            Wheelchair Mobility    Modified Rankin (Stroke Patients Only)       Balance Overall balance assessment: Needs assistance Sitting-balance support: Bilateral upper extremity supported;Feet supported Sitting balance-Leahy Scale: Poor   Postural control: Posterior lean     Standing balance comment: unable to reach standing                             Pertinent Vitals/Pain Pain Assessment: 0-10 Pain Score: 5  Pain Location: abdomen Pain Descriptors / Indicators: Discomfort Pain Intervention(s): Limited activity within patient's tolerance;Monitored during session;Repositioned    Home Living Family/patient expects to be discharged to:: Private residence Living Arrangements: Spouse/significant other Available Help at Discharge: Available 24 hours/day;Family Type of Home: House Home Access: Stairs to enter Entrance Stairs-Rails: Doctor, general practice of Steps: 2 Home Layout: One level Home Equipment: Walker - 2 wheels      Prior Function Level of Independence: Independent         Comments: as of Feb was driving and managing own meds/finances     Hand Dominance   Dominant Hand: Right  Extremity/Trunk Assessment   Upper Extremity Assessment Upper Extremity Assessment: Generalized weakness    Lower Extremity Assessment Lower Extremity Assessment: Generalized weakness       Communication   Communication: No difficulties  Cognition Arousal/Alertness: Awake/alert Behavior During Therapy: Flat affect Overall  Cognitive Status: No family/caregiver present to determine baseline cognitive functioning (perseverated that he was "just about asleep" before therapy entered room)                         Following Commands: Follows one step commands with increased time              General Comments      Exercises     Assessment/Plan    PT Assessment Patient needs continued PT services  PT Problem List Decreased strength;Decreased activity tolerance;Decreased balance;Decreased mobility;Decreased knowledge of use of DME;Decreased skin integrity       PT Treatment Interventions DME instruction;Gait training;Stair training;Functional mobility training;Therapeutic activities;Therapeutic exercise;Balance training;Patient/family education    PT Goals (Current goals can be found in the Care Plan section)  Acute Rehab PT Goals Patient Stated Goal: none stated PT Goal Formulation: With patient Time For Goal Achievement: 06/25/20 Potential to Achieve Goals: Fair    Frequency Min 2X/week   Barriers to discharge Decreased caregiver support      Co-evaluation               AM-PAC PT "6 Clicks" Mobility  Outcome Measure Help needed turning from your back to your side while in a flat bed without using bedrails?: A Lot Help needed moving from lying on your back to sitting on the side of a flat bed without using bedrails?: A Lot Help needed moving to and from a bed to a chair (including a wheelchair)?: A Lot Help needed standing up from a chair using your arms (e.g., wheelchair or bedside chair)?: A Lot Help needed to walk in hospital room?: Total Help needed climbing 3-5 steps with a railing? : Total 6 Click Score: 10    End of Session Equipment Utilized During Treatment: Gait belt Activity Tolerance: Patient limited by fatigue;Patient limited by pain Patient left: in chair;with call bell/phone within reach;with chair alarm set   PT Visit Diagnosis: Muscle weakness  (generalized) (M62.81);Difficulty in walking, not elsewhere classified (R26.2)    Time: 1000-1030 PT Time Calculation (min) (ACUTE ONLY): 30 min   Charges:   PT Evaluation $PT Eval Moderate Complexity: 1 Mod PT Treatments $Therapeutic Activity: 8-22 mins        06/11/2020 Margie, PT Acute Rehabilitation Services Pager:  [phone] Office:  [phone]    Olivia Canter 06/11/2020, 10:50 AM

## 2020-06-12 DIAGNOSIS — Z9189 Other specified personal risk factors, not elsewhere classified: Secondary | ICD-10-CM | POA: Diagnosis not present

## 2020-06-12 DIAGNOSIS — G9341 Metabolic encephalopathy: Secondary | ICD-10-CM | POA: Diagnosis not present

## 2020-06-12 DIAGNOSIS — J9601 Acute respiratory failure with hypoxia: Secondary | ICD-10-CM | POA: Diagnosis not present

## 2020-06-12 DIAGNOSIS — K529 Noninfective gastroenteritis and colitis, unspecified: Secondary | ICD-10-CM | POA: Diagnosis not present

## 2020-06-12 LAB — BASIC METABOLIC PANEL
Anion gap: 6 (ref 5–15)
BUN: 5 mg/dL — ABNORMAL LOW (ref 8–23)
CO2: 30 mmol/L (ref 22–32)
Calcium: 8.4 mg/dL — ABNORMAL LOW (ref 8.9–10.3)
Chloride: 101 mmol/L (ref 98–111)
Creatinine, Ser: 0.52 mg/dL — ABNORMAL LOW (ref 0.61–1.24)
GFR, Estimated: >60 mL/min (ref >60)
Glucose, Bld: 105 mg/dL — ABNORMAL HIGH (ref 70–99)
Potassium: 3 mmol/L — ABNORMAL LOW (ref 3.5–5.1)
Sodium: 137 mmol/L (ref 135–145)

## 2020-06-12 LAB — MAGNESIUM: Magnesium: 1.9 mg/dL (ref 1.7–2.4)

## 2020-06-12 MED ORDER — POTASSIUM CHLORIDE 10 MEQ/100ML IV SOLN
10.0000 meq | INTRAVENOUS | Status: AC
  Administered 2020-06-12 (×6): 10 meq via INTRAVENOUS
  Filled 2020-06-12 (×6): qty 100

## 2020-06-12 MED ORDER — LOPERAMIDE HCL 2 MG PO CAPS
2.0000 mg | ORAL_CAPSULE | Freq: Three times a day (TID) | ORAL | Status: DC
  Administered 2020-06-12 – 2020-06-17 (×14): 2 mg via ORAL
  Filled 2020-06-12 (×16): qty 1

## 2020-06-12 NOTE — Progress Notes (Signed)
While RN changing PIV dressing, pt moved arm and PIV came out. Hazeline Junker, MD aware and okay for pt to not have PIV at this time. Will continue to monitor.

## 2020-06-12 NOTE — Progress Notes (Signed)
PROGRESS NOTE  Michael Wells  DEY:[id number] DOB: 1940/11/24 DOA: 06/09/2020 PCP: Marva Panda, NP  Brief Narrative: Michael Wells is a 80 y.o. 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 diff testing. Urine culture also grew E. coli. The patient was admitted, started on IVF and po vancomycin with modest improvement in symptoms, though weakness remains significant for which SNF is recommended. Hospitalization complicated by agitated delirium, intermittent hypoxia, and electrolyte derangements. Palliative care was consulted due to failure to improve with suspected poor outcome and goals of care were transitioned to purely comfort. The patient and wife decline transfer to hospice at this time.   Assessment & Plan: Principal Problem:   Acute colitis Active Problems:   Sepsis (HCC)   History of embolic stroke   Skin ulcer of buttock, limited to breakdown of skin (HCC)   Hyponatremia   Acute metabolic encephalopathy   Acute respiratory failure with hypoxia (HCC)   Hyperglycemia   Clostridium difficile colitis   Elevated LFTs   At risk for aspiration   End of life care   Palliative care by specialist  Severe sepsis due to C. difficile colitis:  - Continue antimotility agent as he wishes for relief from diarrhea and accepts risk of worsening colitis.  - Contact precautions while still having diarrhea.  Acute hypoxic respiratory failure due to atelectasis: CXR without infiltrate or pulmonary edema, +atelectasis.  - Resolved.  Acute metabolic encephalopathy: Suspect possible underlying vascular dementia/diminished cerebral reserve leading to agitated hospital delirium. Dehydration and severe sepsis also causative. TSH 0.995, folic acid wnl.  - Delirium precautions   History of embolic stroke, right carotid stenosis s/p stent Feb 2022:  - Restarted  aspirin, brilinta, statin based on goals of care.  MASD of buttocks due to frequent diarrhea:  - Barrier cream, imodium.  Vitamin B12 deficiency: Level is significantly low at 86. Was supplemented IM.  - Continue weekly IM supplementation for now.   E. coli UTI:  Treated with keflex    Ascending aortic aneurysm: Incidentally noted on CTA, 4.2cm.  - CTA or MRA was recommended semiannually   Prediabetes: HbA1c 5.8%.   Hypokalemia: Improved 2.4 > 3 over past 24 hours after given.  - Repeat supplementation, prefer IV route due to risk of aspiration/esophagitis. Give 6 runs today and recheck in AM. Mg 1.9.  Hyperbilirubinemia: Improving. RUQ U/S with cholelithiasis without biliary dilatation or evidence of inflammation.  Hyponatremia: Stable  DVT prophylaxis: Lovenox Code Status: Full Family Communication: None at bedside today Disposition Plan:  Status is: Inpatient  Remains inpatient appropriate because:Unsafe d/c plan. Will pursue SNF rehabilitation with plan to return to purely palliative course if not progressing.  Dispo: The patient is from: Home              Anticipated d/c is to: SNF w/hospice services              Patient currently is medically stable to d/c.  Consultants:   Palliative care  Procedures:   None  Antimicrobials:  Oral vancomycin  Ceftriaxone 5/11 - 5/13   Subjective: Resting quietly. No pain, no dyspnea. Worked with PT and amenable to continued PT. Diarrhea stable. Got imodium x4 over past 24 hours.  Objective: Vitals:   06/11/20 0524 06/11/20 1611 06/11/20 1941 06/12/20 0427  BP: 117/69 110/64 100/67 115/66  Pulse:  62 64 (!) 58 65  Resp:  18 18 19   Temp: 97.8 F (36.6 C) 98.1 F (36.7 C) 97.6 F (36.4 C) 98.2 F (36.8 C)  TempSrc:  Axillary    SpO2: 100% 97% 95% 98%  Weight:      Height:        Intake/Output Summary (Last 24 hours) at 06/12/2020 06/14/2020 Last data filed at 06/12/2020 0400 Gross per 24 hour  Intake 560 ml   Output 1050 ml  Net -490 ml   Filed Weights   05/19/2020 1353  Weight: 77.1 kg   Gen: 80 y.o. male in no distress Pulm: Nonlabored breathing room air. Clear. CV: Regular rate and rhythm. No murmur, rub, or gallop. No JVD, stable, 1+ dependent edema. GI: Abdomen soft, non-tender, non-distended, with normoactive bowel sounds.  Ext: Warm, no deformities Skin: No new rashes, lesions or ulcers on visualized skin. Neuro: Drowsy but rousable, no focal neurological deficits. Psych: Judgement and insight appear marginal. Mood euthymic & affect congruent. Behavior is appropriate.       LOS: 22 days   Time spent: 25 minutes.  70, MD [facility] [email or web address] 06/12/2020, 9:23 AM

## 2020-06-12 NOTE — Plan of Care (Signed)
  Problem: Education: Goal: Knowledge of General Education information will improve Description: Including pain rating scale, medication(s)/side effects and non-pharmacologic comfort measures Outcome: Progressing   Problem: Health Behavior/Discharge Planning: Goal: Ability to manage health-related needs will improve Outcome: Progressing   Problem: Clinical Measurements: Goal: Ability to maintain clinical measurements within normal limits will improve Outcome: Progressing Goal: Will remain free from infection Outcome: Progressing   Problem: Elimination: Goal: Will not experience complications related to bowel motility Outcome: Progressing Goal: Will not experience complications related to urinary retention Outcome: Progressing   Problem: Pain Managment: Goal: General experience of comfort will improve Outcome: Progressing   Problem: Safety: Goal: Ability to remain free from injury will improve Outcome: Progressing   Problem: Skin Integrity: Goal: Risk for impaired skin integrity will decrease Outcome: Progressing   

## 2020-06-13 DIAGNOSIS — G9341 Metabolic encephalopathy: Secondary | ICD-10-CM | POA: Diagnosis not present

## 2020-06-13 DIAGNOSIS — K529 Noninfective gastroenteritis and colitis, unspecified: Secondary | ICD-10-CM | POA: Diagnosis not present

## 2020-06-13 DIAGNOSIS — Z9189 Other specified personal risk factors, not elsewhere classified: Secondary | ICD-10-CM | POA: Diagnosis not present

## 2020-06-13 DIAGNOSIS — J9601 Acute respiratory failure with hypoxia: Secondary | ICD-10-CM | POA: Diagnosis not present

## 2020-06-13 LAB — BASIC METABOLIC PANEL
Anion gap: 5 (ref 5–15)
BUN: <5 mg/dL — ABNORMAL LOW (ref 8–23)
CO2: 34 mmol/L — ABNORMAL HIGH (ref 22–32)
Calcium: 8.9 mg/dL (ref 8.9–10.3)
Chloride: 102 mmol/L (ref 98–111)
Creatinine, Ser: 0.63 mg/dL (ref 0.61–1.24)
GFR, Estimated: >60 mL/min (ref >60)
Glucose, Bld: 107 mg/dL — ABNORMAL HIGH (ref 70–99)
Potassium: 3.7 mmol/L (ref 3.5–5.1)
Sodium: 141 mmol/L (ref 135–145)

## 2020-06-13 NOTE — Progress Notes (Signed)
Daily Progress Note   Patient Name: Michael Wells       Date: 06/13/2020 DOB: [birth year]  Age: 80 y.o. MRN#: [id number] Attending Physician: Tyrone Nine, MD Primary Care Physician: Marva Panda, NP Admit Date: 05/19/2020  Reason for Consultation/Follow-up: goals of care, disposition  Subjective: Chart reviewed. Received update from bedside RN - patient has eaten 0% of his meals today. RN also reporting he has been more lethargic today and he didn't feel safe to administer his oral medications.   On my assessment, patient reamains lethargic. Note breakfast and lunch tray are still in the room completely untouched.   Length of Stay: 23  Current Medications: Scheduled Meds:  . aspirin EC  81 mg Oral Daily  . atorvastatin  40 mg Oral Daily  . cyanocobalamin  1,000 mcg Intramuscular Weekly  . enoxaparin (LOVENOX) injection  40 mg Subcutaneous Q24H  . loperamide  2 mg Oral TID  . mouth rinse  15 mL Mouth Rinse BID  . psyllium  1 packet Oral Daily  . saccharomyces boulardii  250 mg Oral BID  . ticagrelor  90 mg Oral BID       PRN Meds: acetaminophen **OR** acetaminophen, antiseptic oral rinse, bisacodyl, morphine CONCENTRATE, ondansetron **OR** ondansetron (ZOFRAN) IV, polyvinyl alcohol, Zinc Oxide  Physical Exam Vitals reviewed.  Constitutional:      General: He is not in acute distress.    Appearance: He is ill-appearing.     Comments: Appears frail  Pulmonary:     Effort: Pulmonary effort is normal.  Neurological:     Mental Status: He is lethargic.     Motor: Weakness present.             Vital Signs: BP 105/68 (BP Location: Right Arm)   Pulse 65   Temp 98.1 F (36.7 C) (Axillary)   Resp 20   Ht 5\' 7"  (1.702 m)   Wt 77.1 kg   SpO2 100%   BMI 26.63 kg/m   SpO2: SpO2: 100 % O2 Device: O2 Device: Room Air  Intake/output summary:   Intake/Output Summary (Last 24 hours) at 06/13/2020 1612 Last data filed at 06/13/2020 0506 Gross per 24 hour  Intake 814.69 ml  Output 600 ml  Net 214.69 ml   LBM: Last BM Date: 06/12/20 Baseline Weight: Weight: 77.1 kg  Most recent weight: Weight: 77.1 kg       Palliative Assessment/Data: PPS 20%       Palliative Care Assessment & Plan   Patient Profile: 80 y.o.malewith past medical history of CVA Feb '22secondary to right carotid stenosis status post stenting, chronic low back pain, hyperlipidemia, ascending aortic aneurysm,admitted on 5/7/2022with diarrhea and weakness.Work up showed acute c.diff colitis with concurrent sepsis.Hospitalization complicated by agitated delirium, intermittent hypoxia, and electrolyte derangements. Palliative care has been consulted to assist with goals of care conversation given poor prognosis.  Comfort measures initiated 5/21, then rescinded 5/27 after patient had some improvement and was determined not to be imminently at EOL.   Assessment: - acute C. diff colitis - sepsis - history of embolic stroke - acute metabolic encephalopathy - acute respiratory failure with hypoxia - aspiration risk -generalized weakness  Recommendations/Plan:  Continue current symptom-based care  DNR/DNI as previously documented  Patient appears to be declining again  Wife is optimistic that patient may be able to ultimately return home after rehab   PMT will continue to follow   Goals of Care and Additional Recommendations:  Limitations on Scope of Treatment: No Artificial Feeding  Code Status:  Prognosis:   < 6 months  Discharge Planning:  Skilled Nursing Facility for rehab with Palliative care service follow-up  Care plan was discussed with Dr. Jarvis Newcomer and bedside RN  Thank you for allowing the Palliative Medicine Team to assist in the care of this  patient.   Total Time 15 minutes Prolonged Time Billed  no       Greater than 50%  of this time was spent counseling and coordinating care related to the above assessment and plan.  Merry Proud, NP  Please contact Palliative Medicine Team phone at [phone] for questions and concerns.

## 2020-06-13 NOTE — Progress Notes (Signed)
Pt more awake and responsive. Managed to take his daily meds this evening

## 2020-06-13 NOTE — Progress Notes (Signed)
Pt morning meds not administered due to lethargy. Will attempt later in the day

## 2020-06-13 NOTE — Progress Notes (Signed)
Physical Therapy Treatment Patient Details Name: Michael Wells MRN: [id number] DOB: Jun 11, 1940 Today's Date: 06/13/2020    History of Present Illness Pt is a 80 y.o. male who presented to the ED 5/7 with diarrhea and weakness found to have colitis on CT and positive C. diff testing. Urine culture also grew E. coli. The patient was admitted, started on IVF and po vancomycin with modest improvement in symptoms initially but now has declined again.  Hospitalization complicated by agitated delirium, intermittent hypoxia, and electrolyte derangements.  Poor overall prognosis. 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  Pt was transitioned to comfort care on 5/21 and is now showing some increased alertness.  MD/Family asked PT to reassess for rehab potential in hopes patient can return home.    PT Comments    Pt was seen for mobility on bed, after working on cleaning up watery bowel movement including his nails.  Pt is distracted and not noticing his need to be cleaned up.  Wife is present, has talked about getting pt up today, but after dependent nature of moving him will not try.  Pt is lethargic and weak, unable to engage his core to roll with less than max assist.  Repositioned him after trying to move on the bed.  Pt is very much unable to move his legs, max assist to do any ROM to move legs.  Will continue to recommend maximove to lift him to chair, and should not be left more than an hour currently given his inability to reposition himself and avoid skin pressure.  Will be at risk for skin shear.    Follow Up Recommendations  SNF     Equipment Recommendations  Wheelchair (measurements PT);Wheelchair cushion (measurements PT);Hospital bed    Recommendations for Other Services       Precautions / Restrictions Precautions Precautions: Fall Precaution Comments: lethargic Restrictions Weight Bearing Restrictions: No     Mobility  Bed Mobility Overal bed mobility: Needs Assistance Bed Mobility: Rolling Rolling: Max assist;+2 for physical assistance;+2 for safety/equipment         General bed mobility comments: rolling to get cleaned up and to practice movement, pt is sleepy and unable to do much to help but tries to engage core    Transfers Overall transfer level: Needs assistance               General transfer comment: lethargic and unable to get sitting today  Ambulation/Gait                 Stairs             Wheelchair Mobility    Modified Rankin (Stroke Patients Only)       Balance                                            Cognition Arousal/Alertness: Lethargic Behavior During Therapy: Flat affect Overall Cognitive Status: Impaired/Different from baseline                                 General Comments: sleepy, struggling to stay awake      Exercises      General Comments General comments (skin integrity, edema, etc.): pt is demonstrating mild skin breakdown  on perirectal area, CNA came in with PT to work on getting pt cleaned up and moved around to roll and reposition      Pertinent Vitals/Pain Pain Assessment: No/denies pain    Home Living                      Prior Function            PT Goals (current goals can now be found in the care plan section) Acute Rehab PT Goals Patient Stated Goal: none stated Progress towards PT goals: Not progressing toward goals - comment    Frequency    Min 2X/week      PT Plan Current plan remains appropriate    Co-evaluation              AM-PAC PT "6 Clicks" Mobility   Outcome Measure  Help needed turning from your back to your side while in a flat bed without using bedrails?: A Lot Help needed moving from lying on your back to sitting on the side of a flat bed without using bedrails?: Total Help needed moving to and from a bed to a chair  (including a wheelchair)?: Total Help needed standing up from a chair using your arms (e.g., wheelchair or bedside chair)?: Total Help needed to walk in hospital room?: Total Help needed climbing 3-5 steps with a railing? : Total 6 Click Score: 7    End of Session   Activity Tolerance: Patient limited by fatigue Patient left: in bed;with call bell/phone within reach;with bed alarm set;with family/visitor present;with nursing/sitter in room Nurse Communication: Mobility status;Need for lift equipment PT Visit Diagnosis: Other abnormalities of gait and mobility (R26.89);Muscle weakness (generalized) (M62.81);Difficulty in walking, not elsewhere classified (R26.2);Adult, failure to thrive (R62.7)     Time: 1117-1202 PT Time Calculation (min) (ACUTE ONLY): 45 min  Charges:  $Therapeutic Activity: 38-52 mins                   Ivar Drape 06/13/2020, 12:34 PM Samul Dada, PT MS Acute Rehab Dept. Number: [facility] [id number] and [facility] [phone]

## 2020-06-13 NOTE — Progress Notes (Signed)
PROGRESS NOTE  Michael Wells  HUD:[id number] DOB: 23-Apr-1940 DOA: 2020/06/09 PCP: Marva Panda, NP  Brief Narrative: Michael Wells is a 80 y.o. 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 diff testing. Urine culture also grew E. coli. The patient was admitted, started on IVF and po vancomycin with modest improvement in symptoms, though weakness remains significant for which SNF is recommended. Hospitalization complicated by agitated delirium, intermittent hypoxia, and electrolyte derangements. Palliative care was consulted due to failure to improve with suspected poor outcome and goals of care were transitioned to purely comfort. Labs were not drawn, most medications not given, IV removed. Over the course of the next few days the patient became more alert again, worked with PT and now comfort measures have been rescinded. Goal is now rehabilitation in hopes to return to home eventually. The patient and family are understanding the continued likelihood of failing rehabilitation efforts, hospice services should be continued at discharge at [facility].   Assessment & Plan: Principal Problem:   Acute colitis Active Problems:   Sepsis (HCC)   History of embolic stroke   Skin ulcer of buttock, limited to breakdown of skin (HCC)   Hyponatremia   Acute metabolic encephalopathy   Acute respiratory failure with hypoxia (HCC)   Hyperglycemia   Clostridium difficile colitis   Elevated LFTs   At risk for aspiration   End of life care   Palliative care by specialist  Severe sepsis due to C. difficile colitis:  - Continue antimotility agent as he wishes for relief from diarrhea and accepts risk of worsening colitis.  - Contact precautions while still having diarrhea.  Acute hypoxic respiratory failure due to atelectasis: CXR without infiltrate or pulmonary edema,  +atelectasis.  - Resolved.  Acute metabolic encephalopathy: Suspect possible underlying vascular dementia/diminished cerebral reserve leading to agitated hospital delirium. Dehydration and severe sepsis also causative. TSH 0.995, folic acid wnl.  - Delirium precautions   History of embolic stroke, right carotid stenosis s/p stent Feb 2022:  - Restarted aspirin, brilinta, statin based on goals of care.  MASD of buttocks due to frequent diarrhea:  - Barrier cream, imodium.  Vitamin B12 deficiency: Level is significantly low at 86. Was supplemented IM.  - Continue weekly IM supplementation for now.   E. coli UTI:  Treated with keflex    Ascending aortic aneurysm: Incidentally noted on CTA, 4.2cm.  - CTA or MRA was recommended semiannually   Prediabetes: HbA1c 5.8%.   Hypokalemia: Resolved with replacement. He has no IV and is at risk of aspiration/esophagitis. Will not repeat supplementation today.   Lower extremity swelling: Strongly suspect aspect of venous insufficiency.  - TED hose.   Hyperbilirubinemia: Improving. RUQ U/S with cholelithiasis without biliary dilatation or evidence of inflammation.  Hyponatremia: Stable  DVT prophylaxis: Lovenox Code Status: Full Family Communication: None at bedside today Disposition Plan:  Status is: Inpatient  Remains inpatient appropriate because:Unsafe d/c plan. Will pursue SNF rehabilitation with plan to return to purely palliative course if not progressing.  Dispo: The patient is from: Home              Anticipated d/c is to: SNF w/hospice services              Patient currently is medically stable to d/c.  Consultants:   Palliative care  Procedures:   None  Antimicrobials:  Oral vancomycin  Ceftriaxone 5/11 - 5/13   Subjective: 2BMs yesterday, spent many hours in the chair yesterday and attributes his current extreme fatigue to this as well as his increased leg swelling.   Objective: Vitals:   06/12/20 1426  06/12/20 2038 06/13/20 0449 06/13/20 1458  BP: 106/66 118/70 121/65 105/68  Pulse: 64 67 65 65  Resp: 18 17 20 20   Temp: 98.2 F (36.8 C) 98 F (36.7 C) 98 F (36.7 C) 98.1 F (36.7 C)  TempSrc:  Oral  Axillary  SpO2: 100% 98% 97% 100%  Weight:      Height:        Intake/Output Summary (Last 24 hours) at 06/13/2020 1500 Last data filed at 06/13/2020 0506 Gross per 24 hour  Intake 814.69 ml  Output 600 ml  Net 214.69 ml   Filed Weights   05/24/2020 1353  Weight: 77.1 kg   Gen: 80 y.o. male in no distress Pulm: Nonlabored breathing room air. Clear. CV: Regular rate and rhythm. No murmur, rub, or gallop. No JVD, stable 1+ pitting dependent edema. GI: Abdomen soft, non-tender, non-distended, with normoactive bowel sounds.  Ext: Warm, no deformities Skin: No new rashes, lesions or ulcers on visualized skin. Neuro: Drowsy but rousable, moves all extremities but severely weak throughout.  Psych: Judgement and insight appear fair. Calm.       LOS: 23 days   Time spent: 25 minutes.  70, MD [facility] [email or web address] 06/13/2020, 3:00 PM

## 2020-06-13 NOTE — TOC Progression Note (Signed)
Transition of Care [facility]) - Progression Note    Patient Details  Name: Michael Wells MRN: [id number] Date of Birth: [birth year]  Transition of Care [facility]) CM/SW Contact  Jimmy Picket, [place] Phone Number: 06/13/2020, 4:46 PM  Clinical Narrative:     [facility] started insurance auth.   Expected Discharge Plan: Skilled Nursing Facility Barriers to Discharge: Continued Medical Work up,SNF Pending bed offer  Expected Discharge Plan and Services Expected Discharge Plan: Skilled Nursing Facility     Post Acute Care Choice: Skilled Nursing Facility Living arrangements for the past 2 months: Single Family Home Expected Discharge Date: 06/13/20                                     Social Determinants of Health (SDOH) Interventions    Readmission Risk Interventions No flowsheet data found.  Jimmy Picket, Theresia Majors, [place] Clinical Social Worker [phone]

## 2020-06-14 DIAGNOSIS — R531 Weakness: Secondary | ICD-10-CM | POA: Diagnosis not present

## 2020-06-14 DIAGNOSIS — Z9189 Other specified personal risk factors, not elsewhere classified: Secondary | ICD-10-CM | POA: Diagnosis not present

## 2020-06-14 DIAGNOSIS — E876 Hypokalemia: Secondary | ICD-10-CM | POA: Diagnosis not present

## 2020-06-14 DIAGNOSIS — K529 Noninfective gastroenteritis and colitis, unspecified: Secondary | ICD-10-CM | POA: Diagnosis not present

## 2020-06-14 DIAGNOSIS — G9341 Metabolic encephalopathy: Secondary | ICD-10-CM | POA: Diagnosis not present

## 2020-06-14 DIAGNOSIS — Z8673 Personal history of transient ischemic attack (TIA), and cerebral infarction without residual deficits: Secondary | ICD-10-CM | POA: Diagnosis not present

## 2020-06-14 LAB — BASIC METABOLIC PANEL
Anion gap: 7 (ref 5–15)
BUN: 7 mg/dL — ABNORMAL LOW (ref 8–23)
CO2: 30 mmol/L (ref 22–32)
Calcium: 8.8 mg/dL — ABNORMAL LOW (ref 8.9–10.3)
Chloride: 102 mmol/L (ref 98–111)
Creatinine, Ser: 0.58 mg/dL — ABNORMAL LOW (ref 0.61–1.24)
GFR, Estimated: >60 mL/min (ref >60)
Glucose, Bld: 106 mg/dL — ABNORMAL HIGH (ref 70–99)
Potassium: 3.1 mmol/L — ABNORMAL LOW (ref 3.5–5.1)
Sodium: 139 mmol/L (ref 135–145)

## 2020-06-14 LAB — CBC
HCT: 35.2 % — ABNORMAL LOW (ref 39.0–52.0)
Hemoglobin: 11.7 g/dL — ABNORMAL LOW (ref 13.0–17.0)
MCH: 33 pg (ref 26.0–34.0)
MCHC: 33.2 g/dL (ref 30.0–36.0)
MCV: 99.2 fL (ref 80.0–100.0)
Platelets: 100 10*3/uL — ABNORMAL LOW (ref 150–400)
RBC: 3.55 MIL/uL — ABNORMAL LOW (ref 4.22–5.81)
RDW: 16.1 % — ABNORMAL HIGH (ref 11.5–15.5)
WBC: 7.1 10*3/uL (ref 4.0–10.5)
nRBC: 0 % (ref 0.0–0.2)

## 2020-06-14 LAB — MAGNESIUM: Magnesium: 1.8 mg/dL (ref 1.7–2.4)

## 2020-06-14 MED ORDER — POTASSIUM CHLORIDE CRYS ER 20 MEQ PO TBCR
40.0000 meq | EXTENDED_RELEASE_TABLET | Freq: Once | ORAL | Status: AC
  Administered 2020-06-14: 40 meq via ORAL
  Filled 2020-06-14: qty 2

## 2020-06-14 NOTE — Progress Notes (Signed)
Daily Progress Note   Patient Name: Michael Wells       Date: 06/14/2020 DOB: 1940-04-22  Age: 80 y.o. MRN#: [id number] Attending Physician: Modena Jansky, MD Primary Care Physician: Everardo Beals, NP Admit Date: 05/24/2020  Reason for Consultation/Follow-up: goals of care, disposition  Subjective: Patient is more alert today. Has eaten approximately 15% of his breakfast.   I completed a MOST form today with patient and his wife. The following treatment decisions were outlined:  Cardiopulmonary Resuscitation: Do Not Attempt Resuscitation (DNR/No CPR)  Medical Interventions: Comfort Measures: Keep clean, warm, and dry. Use medication by any route, positioning, wound care, and other measures to relieve pain and suffering. Use oxygen, suction and manual treatment of airway obstruction as needed for comfort. Do not transfer to the hospital unless comfort needs cannot be met in current location.  Antibiotics: Antibiotics if indicated  IV Fluids: IV fluids for a defined trial period  Feeding Tube: No feeding tube    I spoke with [name] again in the [facility] conference room. She remain optimistic for improvement and tells me that Oriel has been telling her "he is going to work hard" in rehab. She ultimately wants him to be able to return home. I again reviewed with [name] the possibility that [place] may not improve or continue to decline while in rehab, in which case she may be faced with long-term SNF placement if he was not well enough to return home. We reviewed the trajectory of chronic illness and debility. Linda verbalizes understanding.   Provided education and counseling at length on the philosophy and benefits of hospice care. Discussed that it offers a holistic approach to care in the setting  of end-stage illness/disease, and is about supporting the patient where they are allowing nature to take it's course. Hospice care can improve quality of life for patients with end-stage illness. Discussed the hospice team includes RNs, physicians, social workers, and chaplains. They can provide personal care, support for the family, and help keep patient out of the hospital. Discussed that he could receive hospice care at home or in SNF (if he is in long-term status).   I offered and explained the option for an outpatient palliative referral to check-in periodically with patient and family and continue goals of care discussions. Discussed that outpatient palliative can also  help transition to hospice care if his condition declines in the future. [name] is agreeable to outpatient palliative referral.      Length of Stay: 24  Current Medications: Scheduled Meds:  . aspirin EC  81 mg Oral Daily  . atorvastatin  40 mg Oral Daily  . cyanocobalamin  1,000 mcg Intramuscular Weekly  . enoxaparin (LOVENOX) injection  40 mg Subcutaneous Q24H  . loperamide  2 mg Oral TID  . mouth rinse  15 mL Mouth Rinse BID  . psyllium  1 packet Oral Daily  . saccharomyces boulardii  250 mg Oral BID  . ticagrelor  90 mg Oral BID     PRN Meds: acetaminophen **OR** acetaminophen, antiseptic oral rinse, bisacodyl, morphine CONCENTRATE, ondansetron **OR** ondansetron (ZOFRAN) IV, polyvinyl alcohol, Zinc Oxide  Physical Exam Constitutional:      General: He is not in acute distress.    Appearance: He is ill-appearing.  Pulmonary:     Effort: Pulmonary effort is normal.  Neurological:     Mental Status: He is alert.     Motor: Weakness present.             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 Palliative care has been consulted to assist with goals of care conversation given poor prognosis.  Comfort measures initiated 5/21, then rescinded 5/27 after patient had some improvement and was determined not to be imminently at EOL.   Assessment: - acute C. diff colitis - sepsis - history of embolic stroke - acute metabolic encephalopathy - acute respiratory failure with hypoxia - aspiration risk -generalized weakness  Recommendations/Plan:  Continue current symptom-based care  DNR/DNI as previously documented  MOST form completed - original placed on shadow chart and copy made to scan into [employer]  Wife is optimistic that patient may be able to ultimately return home after rehab   Outpatient palliative care referral - wife would prefer [facility] - TOC order placed  PMT will continue to follow   Goals of Care and Additional Recommendations:  Limitations on Scope of Treatment: No Artificial Feeding  Code Status:  DNR  Prognosis:   < 6 months  Discharge Planning:  [place] for rehab with Palliative care service follow-up  Care plan was discussed with Dr. Algis Liming  Thank you for allowing the Palliative Medicine Team to assist in the care of this patient.   Total Time 35 minutes Prolonged Time Billed   no       Greater than 50%  of this time was spent counseling and coordinating care related to the above assessment and plan.  Donalee Citrin  Jennavecia Schwier, NP  Please contact Palliative Medicine Team phone at [phone] for questions and concerns.

## 2020-06-14 NOTE — Plan of Care (Signed)
°  Problem: Respiratory: °Goal: Ability to maintain adequate ventilation will improve °Outcome: Progressing °  °

## 2020-06-14 NOTE — Progress Notes (Addendum)
PROGRESS NOTE  GEOVANIE Wells  JOA:[id number] DOB: 1940/11/01 DOA: 06/04/2020 PCP: Marva Panda, NP  Brief Narrative: Michael Wells is a 80 y.o. 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 diff testing. Urine culture also grew E. coli. The patient was admitted, started on IVF and po vancomycin with modest improvement in symptoms, though weakness remains significant for which SNF is recommended. Hospitalization complicated by agitated delirium, intermittent hypoxia, and electrolyte derangements. Palliative care was consulted due to failure to improve with suspected poor outcome and goals of care were transitioned to purely comfort. Labs were not drawn, most medications not given, IV removed. Over the course of the next few days the patient became more alert again, worked with PT and comfort measures were rescinded. Goal is now rehabilitation in hopes to return to home eventually. The patient and family are understanding the continued likelihood of failing rehabilitation efforts, hospice services should be continued at discharge at [facility].  However since 5/31, appears to again have taken down turn for the worse.  Assessment & Plan: Principal Problem:   Acute colitis Active Problems:   Sepsis (HCC)   History of embolic stroke   Skin ulcer of buttock, limited to breakdown of skin (HCC)   Hyponatremia   Acute metabolic encephalopathy   Acute respiratory failure with hypoxia (HCC)   Hyperglycemia   Clostridium difficile colitis   Elevated LFTs   At risk for aspiration   End of life care   Palliative care by specialist  Severe sepsis due to C. difficile colitis:  - Continue antimotility agent as he wishes for relief from diarrhea and accepts risk of worsening colitis.  - Contact precautions while still having diarrhea. -Appears to have completed a course of  oral vancomycin.  Acute hypoxic respiratory failure due to atelectasis: CXR without infiltrate or pulmonary edema, +atelectasis.  - Resolved.  Acute metabolic encephalopathy: Suspect possible underlying vascular dementia/diminished cerebral reserve leading to agitated hospital delirium. Dehydration and severe sepsis also causative. TSH 0.995, folic acid wnl.  - Delirium precautions  -Appears to have waxing and waning mental status.  Reportedly lethargic yesterday but as per RN, more alert and oriented today.  History of embolic stroke, right carotid stenosis s/p stent Feb 2022:  - Restarted aspirin, brilinta, statin based on goals of care.  MASD of buttocks due to frequent diarrhea:  - Barrier cream, imodium.  3 BMs documented over last 24 hours.  Vitamin B12 deficiency: Level is significantly low at 86. Was supplemented IM.  - Continue weekly IM supplementation for now.   E. coli UTI:  Treated with keflex, completed course.  Ascending aortic aneurysm: Incidentally noted on CTA, 4.2cm.  - CTA or MRA was recommended semiannually   Prediabetes: HbA1c 5.8%.   Hypokalemia: Recurrent.  Replaced today.  Follow BMP in AM.  CM 1.8.  Lower extremity swelling: Strongly suspect aspect of venous insufficiency.  - TED hose, has them on.   Hyperbilirubinemia: Improving. RUQ U/S with cholelithiasis without biliary dilatation or evidence of inflammation.  Checked was on 5/19 when it was 3.  Hyponatremia: Resolved  Thrombocytopenia: Possibly due to acute illness.  Stable.  Trend CBCs.  Adult failure to thrive/goals of care Palliative care follow-up 5/31 appreciated.  Await PMT follow-up.  DVT prophylaxis: Lovenox Code Status: Full Family Communication: I discussed in detail with patient's spouse via  phone, updated care and answered questions.  Advised her that after temporary improvement, it appears that patient is declining again with ongoing poor oral intake and waxing and waning mental  status changes.  Await palliative care follow-up Disposition Plan:  Status is: Inpatient  Remains inpatient appropriate because:Unsafe d/c plan. Will pursue SNF rehabilitation with plan to return to purely palliative course if not progressing.  Dispo: The patient is from: Home              Anticipated d/c is to: SNF w/hospice services              Patient currently is medically stable to d/c.  Patient remains at very high risk for recurrent decline and even death.  Consultants:   Palliative care  Procedures:   None  Antimicrobials:  Oral vancomycin  Ceftriaxone 5/11 - 5/13   Subjective: 3 BMs documented in chart.  Patient only alert and oriented to self when I saw him early this morning.  Unable to tell me if he had diarrhea and was not really saying much.  Objective: Vitals:   06/13/20 1458 06/13/20 2047 06/14/20 0547 06/14/20 0924  BP: 105/68 101/67 106/60 100/67  Pulse: 65 89 74 70  Resp: 20 18 18 18   Temp: 98.1 F (36.7 C) (!) 97.4 F (36.3 C) (!) 97.2 F (36.2 C) 98.3 F (36.8 C)  TempSrc: Axillary Oral Axillary Oral  SpO2: 100%  100% 98%  Weight:      Height:       No intake or output data in the 24 hours ending 06/14/20 1246 Filed Weights   05/15/2020 1353  Weight: 77.1 kg   Gen: 80 y.o. male, elderly, moderately built, frail and chronically ill looking lying comfortably supine in bed. Pulm: Poor inspiratory effort but seems clear to auscultation.  No increased work of breathing. CV: Regular rate and rhythm. No murmur, rub, or gallop. No JVD, stable 1+ pitting dependent edema.  Had bilateral lower extremity TED hose up to knees. GI: Abdomen soft, non-tender, non-distended, with normoactive bowel sounds.  Ext: Warm, no deformities Skin: No new rashes, lesions or ulcers on visualized skin. Neuro: Alert and oriented only to self.  No cranial nerve or focal deficits. Psych: Judgement and insight impaired.     LOS: 24 days   70, MD, [place],  [facility]. [facility]  To contact the attending provider between 7A-7P or the covering provider during after hours 7P-7A, please log into the web site [email or web address] and access using universal [place] password for that web site. If you do not have the password, please call the hospital operator.

## 2020-06-14 NOTE — Plan of Care (Signed)
  Problem: Education: Goal: Knowledge of General Education information will improve Description: Including pain rating scale, medication(s)/side effects and non-pharmacologic comfort measures Outcome: Progressing   Problem: Nutrition: Goal: Adequate nutrition will be maintained Outcome: Progressing   Problem: Elimination: Goal: Will not experience complications related to bowel motility Outcome: Progressing   

## 2020-06-14 DEATH — deceased

## 2020-06-15 DIAGNOSIS — E876 Hypokalemia: Secondary | ICD-10-CM | POA: Diagnosis not present

## 2020-06-15 DIAGNOSIS — G9341 Metabolic encephalopathy: Secondary | ICD-10-CM | POA: Diagnosis not present

## 2020-06-15 LAB — CBC
HCT: 32.8 % — ABNORMAL LOW (ref 39.0–52.0)
Hemoglobin: 10.9 g/dL — ABNORMAL LOW (ref 13.0–17.0)
MCH: 32.9 pg (ref 26.0–34.0)
MCHC: 33.2 g/dL (ref 30.0–36.0)
MCV: 99.1 fL (ref 80.0–100.0)
Platelets: 119 10*3/uL — ABNORMAL LOW (ref 150–400)
RBC: 3.31 MIL/uL — ABNORMAL LOW (ref 4.22–5.81)
RDW: 16.2 % — ABNORMAL HIGH (ref 11.5–15.5)
WBC: 7.4 10*3/uL (ref 4.0–10.5)
nRBC: 0 % (ref 0.0–0.2)

## 2020-06-15 LAB — COMPREHENSIVE METABOLIC PANEL
ALT: 20 U/L (ref 0–44)
AST: 19 U/L (ref 15–41)
Albumin: 2 g/dL — ABNORMAL LOW (ref 3.5–5.0)
Alkaline Phosphatase: 160 U/L — ABNORMAL HIGH (ref 38–126)
Anion gap: 4 — ABNORMAL LOW (ref 5–15)
BUN: 7 mg/dL — ABNORMAL LOW (ref 8–23)
CO2: 33 mmol/L — ABNORMAL HIGH (ref 22–32)
Calcium: 8.8 mg/dL — ABNORMAL LOW (ref 8.9–10.3)
Chloride: 104 mmol/L (ref 98–111)
Creatinine, Ser: 0.63 mg/dL (ref 0.61–1.24)
GFR, Estimated: >60 mL/min (ref >60)
Glucose, Bld: 116 mg/dL — ABNORMAL HIGH (ref 70–99)
Potassium: 2.8 mmol/L — ABNORMAL LOW (ref 3.5–5.1)
Sodium: 141 mmol/L (ref 135–145)
Total Bilirubin: 3.3 mg/dL — ABNORMAL HIGH (ref 0.3–1.2)
Total Protein: 5.2 g/dL — ABNORMAL LOW (ref 6.5–8.1)

## 2020-06-15 LAB — MAGNESIUM: Magnesium: 1.8 mg/dL (ref 1.7–2.4)

## 2020-06-15 MED ORDER — POTASSIUM CHLORIDE CRYS ER 20 MEQ PO TBCR
40.0000 meq | EXTENDED_RELEASE_TABLET | ORAL | Status: AC
  Administered 2020-06-15 (×2): 40 meq via ORAL
  Filled 2020-06-15 (×2): qty 2

## 2020-06-15 NOTE — Progress Notes (Signed)
PROGRESS NOTE  Michael Wells  SPQ:[id number] DOB: October 23, 1940 DOA: 06/05/2020 PCP: Marva Panda, NP  Brief Narrative: Michael Wells is a 80 y.o. 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 diff testing. Urine culture also grew E. coli. The patient was admitted, started on IVF and po vancomycin with modest improvement in symptoms, though weakness remains significant for which SNF is recommended. Hospitalization complicated by agitated delirium, intermittent hypoxia, and electrolyte derangements. Palliative care was consulted due to failure to improve with suspected poor outcome and goals of care were transitioned to purely comfort. Labs were not drawn, most medications not given, IV removed. Over the course of the next few days the patient became more alert again, worked with PT and comfort measures were rescinded. Goal is now rehabilitation in hopes to return to home eventually. The patient and family are understanding the continued likelihood of failing rehabilitation efforts, hospice services should be continued at discharge at [facility].  However since 5/31, appears to again have taken down turn for the worse.  Assessment & Plan: Principal Problem:   Acute colitis Active Problems:   Sepsis (HCC)   History of embolic stroke   Skin ulcer of buttock, limited to breakdown of skin (HCC)   Hyponatremia   Acute metabolic encephalopathy   Acute respiratory failure with hypoxia (HCC)   Hyperglycemia   Clostridium difficile colitis   Elevated LFTs   At risk for aspiration   End of life care   Palliative care by specialist  Severe sepsis due to C. difficile colitis:  - Continue antimotility agent as he wishes for relief from diarrhea and accepts risk of worsening colitis.  - Contact precautions while still having diarrhea. -Appears to have completed a course of  oral vancomycin.  Acute hypoxic respiratory failure due to atelectasis: CXR without infiltrate or pulmonary edema, +atelectasis.  - Resolved.  Acute metabolic encephalopathy: Suspect possible underlying vascular dementia/diminished cerebral reserve leading to agitated hospital delirium. Dehydration and severe sepsis also causative. TSH 0.995, folic acid wnl.  - Delirium precautions  -Appears to have waxing and waning mental status.  Back to being lethargic and just stares blankly and does not say much or follow instructions.  History of embolic stroke, right carotid stenosis s/p stent Feb 2022:  - Restarted aspirin, brilinta, statin based on goals of care.  MASD of buttocks due to frequent diarrhea:  - Barrier cream, imodium.  Unclear if this is better or worse.  3 BMs documented for last 24 hours.  Vitamin B12 deficiency: Level is significantly low at 86. Was supplemented IM.  - Continue weekly IM supplementation for now.   E. coli UTI:  Treated with keflex, completed course.  Ascending aortic aneurysm: Incidentally noted on CTA, 4.2cm.  - CTA or MRA was recommended semiannually   Prediabetes: HbA1c 5.8%.   Hypokalemia: Recurrent.  May be related to ongoing diarrhea and poor oral intake.  Supplementing aggressively and follow BMP in a.m.  Magnesium 1.8.  Lower extremity swelling: Strongly suspect aspect of venous insufficiency.  - TED hose, has them on.   Hyperbilirubinemia: RUQ U/S with cholelithiasis without biliary dilatation or evidence of inflammation.  Bilirubin remains at 3.3.  Unclear etiology.  Hyponatremia: Resolved  Thrombocytopenia: Possibly due to acute illness.  Stable.  Trend CBCs.  Adult failure to thrive/goals of care Palliative care follow-up 5/31 appreciated.  PMD follow-up with spouse yesterday appreciated.  Patient really is not improving.  He continues to decline with inconsistent and overall very poor oral intake, waxing and waning but overall declining  mental status.  Although his wife feels optimistic for improvement, at this time this seems unrealistic goal.  DVT prophylaxis: Lovenox Code Status: Full Family Communication: I discussed in detail with patient's spouse via phone on 6/1, updated care and answered questions.  Advised her that after temporary improvement, it appears that patient is declining again with ongoing poor oral intake and waxing and waning mental status changes.   Disposition Plan:  Status is: Inpatient  Remains inpatient appropriate because:Unsafe d/c plan. Will pursue SNF rehabilitation with plan to return to purely palliative course if not progressing.  Dispo: The patient is from: Home              Anticipated d/c is to: SNF w/hospice services              Patient currently is medically stable to d/c.  Patient remains at very high risk for recurrent decline and even death.  Consultants:   Palliative care  Procedures:   None  Antimicrobials:  Oral vancomycin  Ceftriaxone 5/11 - 5/13   Subjective: Does not say much at all.  Blankly stares at me.  Breakfast in front of him.  When I asked him if he wants to eat, he says "no".  When I told him that he would get weaker if he did not eat well, he then said "so?".  Per RN, ongoing poor oral intake, had a hard time last evening even having him take his oral meds.  Objective: Vitals:   06/14/20 0547 06/14/20 0924 06/14/20 1901 06/15/20 0442  BP: 106/60 100/67 106/65 118/70  Pulse: 74 70 (!) 57 62  Resp: 18 18 18 16   Temp: (!) 97.2 F (36.2 C) 98.3 F (36.8 C) 97.8 F (36.6 C) 98.3 F (36.8 C)  TempSrc: Axillary Oral Oral Axillary  SpO2: 100% 98% 98% 98%  Weight:      Height:        Intake/Output Summary (Last 24 hours) at 06/15/2020 1521 Last data filed at 06/15/2020 0845 Gross per 24 hour  Intake 340 ml  Output 675 ml  Net -335 ml   Filed Weights   Jun 04, 2020 1353  Weight: 77.1 kg   Gen: 80 y.o. male, elderly, moderately built, frail and  chronically ill looking lying comfortably supine in bed. Pulm: Poor inspiratory effort but clear to auscultation.  No increased work of breathing. CV: Regular rate and rhythm. No murmur, rub, or gallop. No JVD, stable 1+ pitting dependent edema.  Had bilateral lower extremity TED hose up to knees. GI: Abdomen soft, non-tender, non-distended, with normoactive bowel sounds.  Ext: Warm, no deformities Skin: No new rashes, lesions or ulcers on visualized skin. Neuro: Alert and oriented only to self.  Answers an occasional question but otherwise nonverbal. Psych: Judgement and insight impaired.     LOS: 25 days   70, MD, [place], [facility]. [facility]  To contact the attending provider between 7A-7P or the covering provider during after hours 7P-7A, please log into the web site [email or web address] and access using universal [place] password for that web site. If you do not have the password, please call the hospital operator.

## 2020-06-15 NOTE — Progress Notes (Signed)
Physical Therapy Treatment Patient Details Name: Michael Wells MRN: [id number] DOB: [birth year] Today's Date: 06/15/2020    History of Present Illness Pt is a 80 y.o. male who presented to the ED 5/7 with diarrhea and weakness found to have colitis on CT and positive C. diff testing. Urine culture also grew E. coli. The patient was admitted, started on IVF and po vancomycin with modest improvement in symptoms initially but now has declined again.  Hospitalization complicated by agitated delirium, intermittent hypoxia, and electrolyte derangements.  Poor overall prognosis. 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  Pt was transitioned to comfort care on 5/21 and is now showing some increased alertness.  MD/Family asked PT to reassess for rehab potential in hopes patient can return home.    PT Comments    Patient received in bed, alert, not conversational. Minimally responsive to direction or questions. Patient requires total assist for bed mobility, has poor sitting balance once positioned in sitting requiring max assist to prevent posterior loss of balance. Patient requires +2 total assist for squat pivot to recliner. Patient will continue to benefit from skilled PT while here to see is his strength and functional mobility will improve to reduce caregiver burden.         Follow Up Recommendations  SNF;Supervision/Assistance - 24 hour     Equipment Recommendations  Wheelchair (measurements PT);Wheelchair cushion (measurements PT);Hospital bed;Other (comment) (TBD at next venue)    Recommendations for Other Services       Precautions / Restrictions Precautions Precautions: Fall Restrictions Weight Bearing Restrictions: No    Mobility  Bed Mobility Overal bed mobility: Needs Assistance Bed Mobility: Supine to Sit     Supine to sit: Total assist;+2 for physical assistance     General bed mobility comments:  total assist to bring legs off bed to raise trunk to sitting position. Requires cues and mod/max assist to prevent posterior balance loss while sitting    Transfers Overall transfer level: Needs assistance Equipment used: None Transfers: Squat Pivot Transfers     Squat pivot transfers: Total assist;+2 physical assistance        Ambulation/Gait                 Stairs             Wheelchair Mobility    Modified Rankin (Stroke Patients Only)       Balance Overall balance assessment: Needs assistance Sitting-balance support: Feet supported Sitting balance-Leahy Scale: Zero Sitting balance - Comments: cannot maintain static sitting at edge of bed. Postural control: Posterior lean Standing balance support: During functional activity Standing balance-Leahy Scale: Zero Standing balance comment: patient requires total assist to pivot to recliner                            Cognition Arousal/Alertness: Awake/alert Behavior During Therapy: Flat affect Overall Cognitive Status: No family/caregiver present to determine baseline cognitive functioning Area of Impairment: Following commands;Awareness;Problem solving                 Orientation Level: Disoriented to;Time;Situation Current Attention Level: Sustained   Following Commands: Follows one step commands with increased time;Follows one step commands inconsistently Safety/Judgement: Decreased awareness of deficits;Decreased awareness of safety Awareness: Intellectual Problem Solving: Slow processing;Decreased initiation;Requires verbal cues;Requires tactile cues;Difficulty sequencing General Comments: Patient is minimally responsive to questions or direction. Requires total assist. Makes  minimal effort to assist.      Exercises      General Comments        Pertinent Vitals/Pain Pain Assessment: No/denies pain    Home Living                      Prior Function             PT Goals (current goals can now be found in the care plan section) Acute Rehab PT Goals Patient Stated Goal: none stated PT Goal Formulation: Patient unable to participate in goal setting Time For Goal Achievement: 06/25/20 Progress towards PT goals: Not progressing toward goals - comment;Goals downgraded-see care plan (continues to require total assist for mobility)    Frequency    Min 2X/week      PT Plan Current plan remains appropriate    Co-evaluation              AM-PAC PT "6 Clicks" Mobility   Outcome Measure  Help needed turning from your back to your side while in a flat bed without using bedrails?: Total Help needed moving from lying on your back to sitting on the side of a flat bed without using bedrails?: Total Help needed moving to and from a bed to a chair (including a wheelchair)?: Total Help needed standing up from a chair using your arms (e.g., wheelchair or bedside chair)?: Total Help needed to walk in hospital room?: Total Help needed climbing 3-5 steps with a railing? : Total 6 Click Score: 6    End of Session Equipment Utilized During Treatment: Gait belt Activity Tolerance: Patient limited by fatigue Patient left: in chair;with call bell/phone within reach;with chair alarm set Nurse Communication: Mobility status;Need for lift equipment PT Visit Diagnosis: Other abnormalities of gait and mobility (R26.89);Muscle weakness (generalized) (M62.81);Adult, failure to thrive (R62.7);Unsteadiness on feet (R26.81)     Time: 0867-6195 PT Time Calculation (min) (ACUTE ONLY): 23 min  Charges:  $Therapeutic Activity: 23-37 mins                     Quantisha Marsicano, PT, GCS 06/15/20,2:44 PM

## 2020-06-16 DIAGNOSIS — E876 Hypokalemia: Secondary | ICD-10-CM | POA: Diagnosis not present

## 2020-06-16 DIAGNOSIS — Z7189 Other specified counseling: Secondary | ICD-10-CM

## 2020-06-16 DIAGNOSIS — Z515 Encounter for palliative care: Secondary | ICD-10-CM | POA: Diagnosis not present

## 2020-06-16 DIAGNOSIS — R54 Age-related physical debility: Secondary | ICD-10-CM | POA: Diagnosis not present

## 2020-06-16 DIAGNOSIS — G9341 Metabolic encephalopathy: Secondary | ICD-10-CM | POA: Diagnosis not present

## 2020-06-16 DIAGNOSIS — R627 Adult failure to thrive: Secondary | ICD-10-CM | POA: Diagnosis not present

## 2020-06-16 DIAGNOSIS — K529 Noninfective gastroenteritis and colitis, unspecified: Secondary | ICD-10-CM

## 2020-06-16 DIAGNOSIS — A0472 Enterocolitis due to Clostridium difficile, not specified as recurrent: Secondary | ICD-10-CM | POA: Diagnosis not present

## 2020-06-16 LAB — BASIC METABOLIC PANEL
Anion gap: 5 (ref 5–15)
BUN: 7 mg/dL — ABNORMAL LOW (ref 8–23)
CO2: 32 mmol/L (ref 22–32)
Calcium: 8.9 mg/dL (ref 8.9–10.3)
Chloride: 106 mmol/L (ref 98–111)
Creatinine, Ser: 0.64 mg/dL (ref 0.61–1.24)
GFR, Estimated: >60 mL/min (ref >60)
Glucose, Bld: 111 mg/dL — ABNORMAL HIGH (ref 70–99)
Potassium: 3.7 mmol/L (ref 3.5–5.1)
Sodium: 143 mmol/L (ref 135–145)

## 2020-06-16 LAB — CBC
HCT: 33.6 % — ABNORMAL LOW (ref 39.0–52.0)
Hemoglobin: 11.2 g/dL — ABNORMAL LOW (ref 13.0–17.0)
MCH: 33.2 pg (ref 26.0–34.0)
MCHC: 33.3 g/dL (ref 30.0–36.0)
MCV: 99.7 fL (ref 80.0–100.0)
Platelets: 130 10*3/uL — ABNORMAL LOW (ref 150–400)
RBC: 3.37 MIL/uL — ABNORMAL LOW (ref 4.22–5.81)
RDW: 16.6 % — ABNORMAL HIGH (ref 11.5–15.5)
WBC: 7 10*3/uL (ref 4.0–10.5)
nRBC: 0 % (ref 0.0–0.2)

## 2020-06-16 MED ORDER — HALOPERIDOL LACTATE 5 MG/ML IJ SOLN: 0.5000 mg | INTRAMUSCULAR | Status: DC | PRN

## 2020-06-16 MED ORDER — LORAZEPAM 2 MG/ML PO CONC: 1.0000 mg | ORAL | Status: DC | PRN

## 2020-06-16 MED ORDER — LORAZEPAM 1 MG PO TABS
1.0000 mg | ORAL_TABLET | ORAL | Status: DC | PRN
  Filled 2020-06-16: qty 1

## 2020-06-16 MED ORDER — LORAZEPAM 2 MG/ML IJ SOLN: 1.0000 mg | INTRAMUSCULAR | Status: DC | PRN

## 2020-06-16 MED ORDER — HALOPERIDOL 0.5 MG PO TABS
0.5000 mg | ORAL_TABLET | ORAL | Status: DC | PRN
  Filled 2020-06-16: qty 1

## 2020-06-16 MED ORDER — HALOPERIDOL LACTATE 2 MG/ML PO CONC
0.5000 mg | ORAL | Status: DC | PRN
  Filled 2020-06-16: qty 0.3

## 2020-06-16 MED ORDER — ASPIRIN 81 MG PO CHEW
81.0000 mg | CHEWABLE_TABLET | Freq: Every day | ORAL | Status: DC
  Administered 2020-06-16: 81 mg via ORAL
  Filled 2020-06-16: qty 1

## 2020-06-16 MED ORDER — GLYCOPYRROLATE 0.2 MG/ML IJ SOLN
0.2000 mg | INTRAMUSCULAR | Status: DC | PRN
Start: 2020-06-16 — End: 2020-06-21

## 2020-06-16 MED ORDER — GLYCOPYRROLATE 1 MG PO TABS
1.0000 mg | ORAL_TABLET | ORAL | Status: DC | PRN
  Filled 2020-06-16: qty 1

## 2020-06-16 MED ORDER — GLYCOPYRROLATE 0.2 MG/ML IJ SOLN: 0.2000 mg | INTRAMUSCULAR | Status: DC | PRN

## 2020-06-16 NOTE — Progress Notes (Signed)
PROGRESS NOTE  Michael Wells  OZH:[id number] DOB: May 30, 1940 DOA: 05/22/2020 PCP: Marva Panda, NP  Brief Narrative: Michael Wells is a 80 y.o. 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 diff testing. Urine culture also grew E. coli. The patient was admitted, started on IVF and po vancomycin with modest improvement in symptoms, though weakness remains significant for which SNF is recommended. Hospitalization complicated by agitated delirium, intermittent hypoxia, and electrolyte derangements. Palliative care was consulted due to failure to improve with suspected poor outcome and goals of care were transitioned to purely comfort. Labs were not drawn, most medications not given, IV removed. Over the course of the next few days the patient became more alert again, worked with PT and comfort measures were rescinded. Goal is now rehabilitation in hopes to return to home eventually. The patient and family are understanding the continued likelihood of failing rehabilitation efforts, hospice services should be continued at discharge at [facility].  However since 5/31, appears to again have taken down turn for the worse.  Assessment & Plan: Principal Problem:   Acute colitis Active Problems:   Sepsis (HCC)   History of embolic stroke   Skin ulcer of buttock, limited to breakdown of skin (HCC)   Hyponatremia   Acute metabolic encephalopathy   Acute respiratory failure with hypoxia (HCC)   Hyperglycemia   Clostridium difficile colitis   Elevated LFTs   At risk for aspiration   End of life care   Palliative care by specialist  Severe sepsis due to C. difficile colitis:  - Continue antimotility agent as he wishes for relief from diarrhea and accepts risk of worsening colitis.  Chart review, diarrhea seems to have improved. - Contact precautions while still having  diarrhea. -Appears to have completed a course of oral vancomycin.  Acute hypoxic respiratory failure due to atelectasis: CXR without infiltrate or pulmonary edema, +atelectasis.  - Resolved.  Acute metabolic encephalopathy: Suspect possible underlying vascular dementia/diminished cerebral reserve leading to agitated hospital delirium. Dehydration and severe sepsis also causative. TSH 0.995, folic acid wnl.  - Delirium precautions  -Appears to have waxing and waning mental status.  Back to being lethargic and just stares blankly and does not say much or follow instructions over the last 2 days.  History of embolic stroke, right carotid stenosis s/p stent Feb 2022:  - Restarted aspirin, brilinta, statin based on goals of care.  MASD of buttocks due to frequent diarrhea:  - Barrier cream, imodium.  Unclear if this is better or worse.    Vitamin B12 deficiency: Level is significantly low at 86. Was supplemented IM.  - Continue weekly IM supplementation for now.   E. coli UTI:  Treated with keflex, completed course.  Ascending aortic aneurysm: Incidentally noted on CTA, 4.2cm.  - CTA or MRA was recommended semiannually   Prediabetes: HbA1c 5.8%.   Hypokalemia: Recurrent.  May be related to ongoing diarrhea and poor oral intake.  Finally replaced.  Magnesium 1.8.  Remains at risk for recurrence.  Lower extremity swelling: Strongly suspect aspect of venous insufficiency.  - TED hose, has them on.   Hyperbilirubinemia: RUQ U/S with cholelithiasis without biliary dilatation or evidence of inflammation.  Bilirubin remains at 3.3.  Unclear etiology.  Hyponatremia: Resolved  Thrombocytopenia: Possibly due to acute illness.  Stable.  Trend CBCs.  Adult failure to thrive/goals  of care Palliative care follow-up 5/31 appreciated.  PMT follow-up with spouse yesterday appreciated.  Patient really is not improving.  He continues to decline with inconsistent and overall very poor oral intake,  waxing and waning but overall declining mental status.  Although his wife feels optimistic for improvement, at this time this seems unrealistic goal.  DVT prophylaxis: Lovenox Code Status: Full Family Communication: I discussed in detail with patient's spouse via phone on 6/1, updated care and answered questions.  Advised her that after temporary improvement, it appears that patient is declining again with ongoing poor oral intake and waxing and waning mental status changes.  Unable to reach spouse via phone on 6/3, left VM message. Disposition Plan:  Status is: Inpatient  Remains inpatient appropriate because:Unsafe d/c plan. Will pursue SNF rehabilitation with plan to return to purely palliative course if not progressing.  Dispo: The patient is from: Home              Anticipated d/c is to: SNF w/hospice services              Patient currently is medically stable to d/c.  Patient remains at very high risk for recurrent decline and even death.  Consultants:   Palliative care  Procedures:   None  Antimicrobials:  Oral vancomycin  Ceftriaxone 5/11 - 5/13   Subjective: "How are you".  Oriented to self.  Refuses breakfast, "I am not hungry".  Does not say anything more.  As per nursing, drinks some when his spouse is around but other times really not taking in much.  Objective: Vitals:   06/14/20 0924 06/14/20 1901 06/15/20 0442 06/16/20 0520  BP: 100/67 106/65 118/70 109/65  Pulse: 70 (!) 57 62 85  Resp: 18 18 16 15   Temp: 98.3 F (36.8 C) 97.8 F (36.6 C) 98.3 F (36.8 C) 98.3 F (36.8 C)  TempSrc: Oral Oral Axillary Axillary  SpO2: 98% 98% 98% 98%  Weight:      Height:        Intake/Output Summary (Last 24 hours) at 06/16/2020 1350 Last data filed at 06/16/2020 0535 Gross per 24 hour  Intake 120 ml  Output 800 ml  Net -680 ml   Filed Weights   05/22/2020 1353  Weight: 77.1 kg   Gen: 80 y.o. male, elderly, moderately built, frail and chronically ill looking lying  comfortably supine in bed. Pulm: Poor inspiratory effort but clear to auscultation.  No increased work of breathing. CV: Regular rate and rhythm. No murmur, rub, or gallop. No JVD, stable 1+ pitting dependent edema.  Had bilateral lower extremity TED hose up to knees. GI: Abdomen soft, non-tender, non-distended, with normoactive bowel sounds.  Ext: Warm, no deformities Skin: No new rashes, lesions or ulcers on visualized skin. Neuro: Seemed somewhat drowsy but arousable and oriented to self.  Answers an occasional question but otherwise nonverbal. Psych: Judgement and insight impaired.     LOS: 26 days   70, MD, [place], [facility]  To contact the attending provider between 7A-7P or the covering provider during after hours 7P-7A, please log into the web site [email or web address] and access using universal  password for that web site. If you do not have the password, please call the hospital operator.

## 2020-06-16 NOTE — TOC Progression Note (Signed)
Transition of Care [facility]) - Progression Note    Patient Details  Name: ERICH KOCHAN MRN: [id number] Date of Birth: 1940/08/26  Transition of Care [facility]) CM/SW Contact  Jimmy Picket, [place] Phone Number: 06/16/2020, 3:19 PM  Clinical Narrative:     CSW received call from [employer] with [facility] requesting clinicals. They stated [facility] started [place] but did not fax in clinicals. CSW attempted to call [facility] but was unable to talk to an admission coordinator or the person providing coverage. CSW faxed in requested clinicals to [phone], reference number [id number].   CSW discussed case with TOC supervisor. TOC supervisor reccommended CSW send a message to Dr. Judithann Sheen  For pt to be reviewed by the appropriate use committee.   Expected Discharge Plan: Skilled Nursing Facility Barriers to Discharge: Continued Medical Work up,SNF Pending bed offer  Expected Discharge Plan and Services Expected Discharge Plan: Skilled Nursing Facility     Post Acute Care Choice: Skilled Nursing Facility Living arrangements for the past 2 months: Single Family Home Expected Discharge Date: 06/13/20                                     Social Determinants of Health (SDOH) Interventions    Readmission Risk Interventions No flowsheet data found.  Jimmy Picket, Theresia Majors, [place] Clinical Social Worker [phone]

## 2020-06-16 NOTE — Progress Notes (Signed)
Daily Progress Note   Patient Name: Michael Wells       Date: 06/16/2020 DOB: [birth year]  Age: 80 y.o. MRN#: [id number] Attending Physician: Michael Etienne, MD Primary Care Physician: Michael Panda, NP Admit Date: 06/04/2020  Reason for Consultation/Follow-up: Establishing goals of care  Subjective: Palliative medicine requested by attending team to return and re-evaluate patient for residential Hospice and GOC discussion with spouse.  Patient is not eating solid food. Taking sips of liquids. Very lethargic. Sleeping more than he is awake.  On my eval he was moaning constantly while receiving personal care.  Called his spouse Michael Wells. Michael Wells agrees that patient has again started to decline. She notes that he has said, "I just don't feel good". She is in agreement with resuming comfort measures only. She was very tearful and emotional.  We discussed disposition. I do believe that inpatient Hospice would be an appropriate disposition as he needs symptom management of his pain and delirium. Michael Wells is reluctant. There is a person that she wishes to speak to about Hospice house. She asked if patient could remain in the hospital. I encouraged her to consider Hospice house they are better trained to provide good comfort care to patient's at EOL.  Review of Systems  Unable to perform ROS: Acuity of condition       Palliative Assessment/Data: PPS: 20%       Palliative Care Assessment & Plan   Patient Profile: 80 y.o.malewith past medical history of CVA Feb '22secondary to right carotid stenosis status post stenting, chronic low back pain, hyperlipidemia, ascending aortic aneurysm,admitted on 5/7/2022with diarrhea and weakness.Work up showed acute c.diff colitis with concurrent  sepsis.Hospitalization complicated by agitated delirium, intermittent hypoxia, and electrolyte derangements. Palliative care has been consulted to assist with goals of care conversation given poor prognosis.  Comfort measures initiated 5/21, then rescinded 5/27 after patient had some improvement and was determined not to be imminently at EOL.  Assessment/Recommendations/Plan  Resume comfort measures only Maintenance medications dc'd Comfort meds as ordered   Goals of Care and Additional Recommendations: Limitations on Scope of Treatment: Full Comfort Care  Code Status: DNR  Prognosis:  < 2 weeks  Discharge Planning: To Be Determined  Care plan was discussed with spouse  Thank you for allowing the Palliative Medicine Team to assist in the care  of this patient.   Total time: 73 minutes Greater than 50%  of this time was spent counseling and coordinating care related to the above assessment and plan.  Michael Wells, AGNP-C Palliative Medicine   Please contact Palliative Medicine Team phone at [phone] for questions and concerns.

## 2020-06-16 NOTE — Progress Notes (Signed)
Pt and family prefer all 4 rails up for safety

## 2020-06-16 NOTE — Progress Notes (Signed)
Pt had difficulty swallowing pills crushed in applesauce without multiple prompts and gentle rubbing along side of throat.  More lethargic than when I had him last week.

## 2020-06-16 NOTE — Progress Notes (Signed)
Pt more lethargic at end of shift than at beginning of shift. Pt smiled and joked a few times before I left.  He drank almost 2 chocolate Ensure's for the day.   He had 3 mushy/watery BMs today.  Administered one dose of morphine before shift change d/t pain--pt moaning and said he had pain.  Wife bedside and tearful. She is having a difficult time taking in everything, as he had rebounded back earlier.

## 2020-06-17 DIAGNOSIS — R627 Adult failure to thrive: Secondary | ICD-10-CM | POA: Diagnosis not present

## 2020-06-17 DIAGNOSIS — K529 Noninfective gastroenteritis and colitis, unspecified: Secondary | ICD-10-CM | POA: Diagnosis not present

## 2020-06-17 DIAGNOSIS — A0472 Enterocolitis due to Clostridium difficile, not specified as recurrent: Secondary | ICD-10-CM | POA: Diagnosis not present

## 2020-06-17 DIAGNOSIS — R54 Age-related physical debility: Secondary | ICD-10-CM | POA: Diagnosis not present

## 2020-06-17 DIAGNOSIS — G9341 Metabolic encephalopathy: Secondary | ICD-10-CM | POA: Diagnosis not present

## 2020-06-17 NOTE — TOC Progression Note (Signed)
Transition of Care [facility]) - Progression Note    Patient Details  Name: Michael Wells MRN: [id number] Date of Birth: Feb 01, 1940  Transition of [facility]) CM/SW Contact  Terrial Rhodes, LCSWA Phone Number: 06/17/2020, 3:43 PM  Clinical Narrative:     Patient has SN[facility] bed [facility]. CSW tried to call [facility] to follow up on insurance authorization. CSW lvm and awaiting callback. Patients plan to be determined. Palliative following. CSW will continue to follow and assist with discharge planning needs.   Expected Discharge Plan: Skilled Nursing Facility Barriers to Discharge: Continued Medical Work up,SNF Pending bed offer  Expected Discharge Plan and Services Expected Discharge Plan: Skilled Nursing Facility     Post Acute Care Choice: Skilled Nursing Facility Living arrangements for the past 2 months: Single Family Home Expected Discharge Date: 06/13/20                                     Social Determinants of Health (SDOH) Interventions    Readmission Risk Interventions No flowsheet data found.

## 2020-06-17 NOTE — Progress Notes (Signed)
PROGRESS NOTE  Michael Wells  ZHG:[id number] DOB: [birth year] DOA: 06/13/2020 PCP: Marva Panda, NP  Brief Narrative: Michael Wells is a 80 y.o. 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 diff testing. Urine culture also grew E. coli. The patient was admitted, started on IVF and po vancomycin with modest improvement in symptoms, though weakness remains significant for which SNF is recommended. Hospitalization complicated by agitated delirium, intermittent hypoxia, and electrolyte derangements. Palliative care was consulted due to failure to improve with suspected poor outcome and goals of care were transitioned to purely comfort. Labs were not drawn, most medications not given, IV removed. Over the course of the next few days the patient became more alert again, worked with PT and comfort measures were rescinded.  Goal was to pursue rehab at [facility] and to return home eventually.  However patient again rapidly declined, transitioned back to full comfort care, spouse contemplating hospice home but has not decided yet.    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 difficile colitis:  -Completed course of vancomycin.  Ongoing diarrhea, 5 documented in the last 24 hours.  Now full comfort care.  Acute hypoxic respiratory failure due to atelectasis:   Resolved.  Acute metabolic encephalopathy:  Multifactorial due to possible underlying vascular dementia/diminished cerebral reserve complicated by acute illness, hospitalization, sleep deprivation etc.  Has not been agitated for several days now.  TSH 0.995, folic acid wnl.  Progressive and sustained worsening of mental status over the last 4 days.  Now on full comfort care.  History of embolic stroke, right carotid stenosis s/p stent Feb 2022:  Since patient transitioning to full comfort care, I discussed with spouse in detail today who is agreeable to discontinue all lab work and all medications nonessential to comfort.  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  Vitamin B12 deficiency:  Discontinued supplements.  E. coli UTI:   Completed antibiotic course.  Ascending aortic aneurysm:  Incidentally noted on CTA, 4.2cm.   Prediabetes:  HbA1c 5.8%.   Hypokalemia:  Recurrent.  May be related to ongoing diarrhea and poor oral intake.  Finally replaced.  Magnesium 1.8.  Remains at risk for recurrence but no further lab checks due to full comfort care.  Lower extremity swelling:  Strongly suspect aspect of venous insufficiency.   Hyperbilirubinemia:  RUQ U/S with cholelithiasis without biliary dilatation or evidence of inflammation.  Bilirubin remains at 3.3.  Unclear etiology.  Hyponatremia:  Resolved  Thrombocytopenia:  Possibly due to acute illness.    Adult failure to thrive/goals of care Patient continues to rapidly decline.  No oral intake.  Progressively lethargic and less responsive.  Spouse reporting apneic spells to palliative care team.  Now transitioned to full comfort care.  Spouse contemplating regarding hospice home but not sure yet.  DVT prophylaxis: Lovenox Code Status: Full Family Communication: I discussed in detail with patient's spouse in person on 6/4 at bedside. Disposition Plan:  Status is: Inpatient  Remains  inpatient appropriate because: No safe discharge disposition yet.  Dispo: The patient is from: Home              Anticipated d/c is to: Hospital demise versus residential hospice              Patient currently is not medically stable for DC unless to residential hospice  Consultants:   Palliative care  Procedures:   None  Antimicrobials:  Oral vancomycin  Ceftriaxone 5/11 - 5/13   Subjective: Stares blankly.  No verbal response.  Objective: Vitals:   06/15/20 0442 06/16/20 0520 06/16/20 1531 06/17/20 0623  BP: 118/70 109/65 107/60 105/61  Pulse: 62 85 80 81  Resp: 16 15 15 15   Temp: 98.3 F (36.8 C) 98.3 F (36.8 C) 97.7 F (36.5 C) 97.7 F (36.5 C)  TempSrc: Axillary Axillary Oral Oral  SpO2: 98% 98% 98% 97%  Weight:      Height:        Intake/Output Summary (Last 24 hours) at 06/17/2020 1522 Last data filed at 06/17/2020 0000 Gross per 24 hour  Intake 50 ml  Output 400 ml  Net -350 ml   Filed Weights   06/04/2020 1353  Weight: 77.1 kg   Gen: 80 y.o. male, elderly, moderately built, frail and chronically ill looks progressively worse and lethargic today.  Somnolent, intermittently opens eyes. Pulm: Poor inspiratory effort.  Diminished breath sounds bilaterally but did not appreciate crackles or rhonchi.  I did not witness apneic spells. CV: S1 and S2 heard, RRR.  No JVD or murmurs.  Trace ankle edema. GI: Abdomen soft, non-tender, non-distended, with normoactive bowel sounds.  Ext: Warm, no deformities Skin: No new rashes, lesions or ulcers on visualized skin. Neuro: Mental status as noted above. Psych: Judgement and insight impaired.     LOS: 27 days   70, MD, [place], [facility]. [facility]  To contact the attending provider between 7A-7P or the covering provider during after hours 7P-7A, please log into the web site [email or web address] and access using universal [place] password for that web site. If you do not have the password, please call the  hospital operator.

## 2020-06-17 NOTE — Progress Notes (Signed)
Daily Progress Note   Patient Name: Michael Wells       Date: 06/17/2020 DOB: September 03, 1940  Age: 80 y.o. MRN#: [id number] Attending Physician: Elease Etienne, MD Primary Care Physician: Marva Panda, NP Admit Date: 2020/05/22  Reason for Consultation/Follow-up: Establishing goals of care  Subjective: Patient is staring into space, pulling at his gown. His spouse is at bedside. Earlier he was looking up, said, "I'm not there yet". He doesn't answer my questions. His breathing is a bit more labored today. [name] has noticed some periods of apnea. Discussed Hospice house referral with [name]. She is uncertain and would like more time to consider this option.   Review of Systems  Unable to perform ROS: Acuity of condition    Length of Stay: 27  Current Medications: Scheduled Meds:  . loperamide  2 mg Oral TID  . mouth rinse  15 mL Mouth Rinse BID  . saccharomyces boulardii  250 mg Oral BID    Continuous Infusions:   PRN Meds: acetaminophen **OR** acetaminophen, antiseptic oral rinse, glycopyrrolate **OR** glycopyrrolate **OR** glycopyrrolate, haloperidol **OR** haloperidol **OR** haloperidol lactate, LORazepam **OR** LORazepam **OR** LORazepam, morphine CONCENTRATE, ondansetron **OR** ondansetron (ZOFRAN) IV, polyvinyl alcohol, Zinc Oxide  Physical Exam Vitals and nursing note reviewed.  Constitutional:      Appearance: He is ill-appearing.     Comments: cachectic  Pulmonary:     Comments: Mouth breathing, periods of apnea followed by labored breathing Neurological:     Mental Status: He is disoriented.             Vital Signs: BP 105/61 (BP Location: Left Arm)   Pulse 81   Temp 97.7 F (36.5 C) (Oral)   Resp 15   Ht 5\' 7"  (1.702 m)   Wt 77.1 kg   SpO2 97%   BMI  26.63 kg/m  SpO2: SpO2: 97 % O2 Device: O2 Device: Room Air O2 Flow Rate: O2 Flow Rate (L/min): 5 L/min  Intake/output summary:   Intake/Output Summary (Last 24 hours) at 06/17/2020 1316 Last data filed at 06/17/2020 0000 Gross per 24 hour  Intake 50 ml  Output 800 ml  Net -750 ml   LBM: Last BM Date: 06/17/20 Baseline Weight: Weight: 77.1 kg Most recent weight: Weight: 77.1 kg       Palliative Assessment/Data: PPS: 20%  Flowsheet Rows   Flowsheet Row Most Recent Value  Intake Tab   Referral Department Hospitalist  Unit at Time of Referral Intermediate Care Unit  Date Notified 05/31/20  Palliative Care Type New Palliative care  Reason for referral Clarify Goals of Care  Date of Admission 06/12/2020  Date first seen by Palliative Care 06/02/20  # of days Palliative referral response time 2 Day(s)  # of days IP prior to Palliative referral 11  Clinical Assessment   Psychosocial & Spiritual Assessment   Palliative Care Outcomes       Patient Active Problem List   Diagnosis Date Noted  . Failure to thrive in adult   . 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 Goals of care, counseling/discussion   . Colitis   . Palliative care by specialist   . Elevated LFTs   . At risk for aspiration   . End of life care   . Clostridium difficile colitis 05/21/2020  . Acute colitis 06/12/20  . Sepsis (HCC) 06-12-20  . History of embolic stroke 2020/06/12  . Carotid artery stenosis, symptomatic, right 06/12/20  . Skin ulcer of buttock, limited to breakdown of skin (HCC) Jun 12, 2020  . Hyponatremia 2020/06/12  . Acute metabolic encephalopathy 06-12-2020  . Acute respiratory failure with hypoxia (HCC) 2020/06/12  . Hyperglycemia Jun 12, 2020  . Acute ischemic stroke (HCC) 03/10/2020  . 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 Chronic pain 03/09/2020    Palliative Care Assessment & Plan   Patient Profile: 81 y.o.malewith past medical history of CVA Feb '22secondary to right  carotid stenosis status post stenting, chronic low back pain, hyperlipidemia, ascending aortic aneurysm,admitted on 2022-05-30with diarrhea and weakness.Work up showed acute c.diff colitis with concurrent sepsis.Hospitalization complicated by agitated delirium, intermittent hypoxia, and electrolyte derangements. Palliative care has been consulted to assist with goals of care conversation given poor prognosis.  Comfort measures initiated 5/21, then rescinded 5/27 after patient had some improvement and was determined not to be imminently at EOL. Patient has again declined and family has opted for comfort measures only.  Assessment/Recommendations/Plan  Continue current comfort interventions- goals of care are clear for comfort measures only  Code Status: DNR  Prognosis:  < 2 weeks  Discharge Planning: To Be Determined  Care plan was discussed with patient's spouse.  Thank you for allowing the Palliative Medicine Team to assist in the care of this patient.   Total time: 46 minutes Greater than 50%  of this time was spent counseling and coordinating care related to the above assessment and plan.  Ocie Bob, AGNP-C Palliative Medicine   Please contact Palliative Medicine Team phone at [phone] for questions and concerns.       Marland Kitchen

## 2020-06-18 DIAGNOSIS — K529 Noninfective gastroenteritis and colitis, unspecified: Secondary | ICD-10-CM | POA: Diagnosis not present

## 2020-06-18 DIAGNOSIS — Z515 Encounter for palliative care: Secondary | ICD-10-CM | POA: Diagnosis not present

## 2020-06-18 DIAGNOSIS — J9601 Acute respiratory failure with hypoxia: Secondary | ICD-10-CM | POA: Diagnosis not present

## 2020-06-18 DIAGNOSIS — A0472 Enterocolitis due to Clostridium difficile, not specified as recurrent: Secondary | ICD-10-CM | POA: Diagnosis not present

## 2020-06-18 DIAGNOSIS — R627 Adult failure to thrive: Secondary | ICD-10-CM

## 2020-06-18 MED ORDER — MORPHINE SULFATE (CONCENTRATE) 10 MG/0.5ML PO SOLN
2.6000 mg | ORAL | Status: DC
  Administered 2020-06-18 – 2020-06-19 (×6): 2.6 mg via ORAL
  Filled 2020-06-18 (×5): qty 0.5

## 2020-06-18 MED ORDER — LOPERAMIDE HCL 2 MG PO CAPS: 2.0000 mg | ORAL_CAPSULE | ORAL | Status: DC | PRN

## 2020-06-18 MED ORDER — MORPHINE SULFATE (CONCENTRATE) 10 MG/0.5ML PO SOLN: 2.6000 mg | ORAL | Status: DC

## 2020-06-18 NOTE — Progress Notes (Signed)
PROGRESS NOTE  Michael Wells  YOV:[id number] DOB: 23-Jul-1940 DOA: 06/10/2020 PCP: Marva Panda, NP  Brief Narrative: Michael Wells is a 80 y.o. 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 diff testing. Urine culture also grew E. coli. The patient was admitted, started on IVF and po vancomycin with modest improvement in symptoms, though weakness remains significant for which SNF is recommended. Hospitalization complicated by agitated delirium, intermittent hypoxia, and electrolyte derangements. Palliative care was consulted due to failure to improve with suspected poor outcome and goals of care were transitioned to purely comfort. Labs were not drawn, most medications not given, IV removed. Over the course of the next few days the patient became more alert again, worked with PT and comfort measures were rescinded.  Goal was to pursue rehab at [facility] and to return home eventually.  However patient again rapidly declined, transitioned back to full comfort care.  Imminently dying, hospital death anticipated.  Unstable to transfer to residential hospice even if opted by spouse.  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 difficile colitis:  -Completed course of vancomycin.  Ongoing diarrhea, 5 documented in the last 24 hours.  Now full comfort  care.  Acute hypoxic respiratory failure due to atelectasis:  Resolved.  Acute metabolic encephalopathy:  Multifactorial due to possible underlying vascular dementia/diminished cerebral reserve complicated by acute illness, hospitalization, sleep deprivation etc.  Has not been agitated for several days now.  TSH 0.995, folic acid wnl.  Progressive and sustained worsening of mental status over the last 4 days.  Now on full comfort care.  History of embolic stroke, right carotid stenosis s/p stent Feb 2022:  Full comfort care.  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  Vitamin B12 deficiency:  Discontinued supplements.  E. coli UTI:   Completed antibiotic course.  Ascending aortic aneurysm:  Incidentally noted on CTA, 4.2cm.   Prediabetes:  HbA1c 5.8%.   Hypokalemia:  Recurrent.  May be related to ongoing diarrhea and poor oral intake.  Finally replaced.  Magnesium 1.8.  Remains at risk for recurrence but no further lab checks due to full comfort care.  Lower extremity swelling:  Strongly suspect aspect of venous insufficiency.   Hyperbilirubinemia:  RUQ U/S with cholelithiasis without biliary dilatation or evidence of inflammation.  Bilirubin remains at 3.3.  Unclear etiology.  Hyponatremia:  Resolved  Thrombocytopenia:  Possibly due to acute illness.    Adult failure to thrive/goals of care Patient rapidly declined over the last 4 to 5 days.  Now transitioned to full comfort care and is imminently dying.  DVT prophylaxis: Lovenox Code Status: Full Family Communication: I discussed with patient spouse in detail at bedside, comforted her and requested chaplain to come speak with her  as well. Disposition Plan:  Status is: Inpatient  Remains inpatient appropriate because: Hospital death anticipated.  Dispo: The patient is from: Home              Anticipated d/c is to: Hospital demise.              Patient currently is not medically  stable for DC.  Consultants:   Palliative care  Procedures:   None  Antimicrobials:  Oral vancomycin  Ceftriaxone 5/11 - 5/13   Subjective: Imminently dying.  Eyes open, unresponsive, mouth breathing.  Spouse at bedside, tearful at times.  Objective: Vitals:   06/16/20 1531 06/17/20 0623 06/18/20 0459 06/18/20 0848  BP: 107/60 105/61 132/85   Pulse: 80 81 100   Resp: 15 15 18    Temp: 97.7 F (36.5 C) 97.7 F (36.5 C) 99.2 F (37.3 C) 98.9 F (37.2 C)  TempSrc: Oral Oral Axillary Axillary  SpO2: 98% 97% 95%   Weight:      Height:        Intake/Output Summary (Last 24 hours) at 06/18/2020 1414 Last data filed at 06/18/2020 0900 Gross per 24 hour  Intake 0 ml  Output 800 ml  Net -800 ml   Filed Weights   06-06-20 1353  Weight: 77.1 kg   Gen: 80 y.o. male, elderly, moderately built, frail and chronically ill looking, now has significantly worsened even since yesterday, unresponsive, mouth breathing and appears to be imminently dying.  Does not appear in any pain or discomfort.      LOS: 28 days   70, MD, [place], [facility]. [facility]  To contact the attending provider between 7A-7P or the covering provider during after hours 7P-7A, please log into the web site [email or web address] and access using universal [place] password for that web site. If you do not have the password, please call the hospital operator.

## 2020-06-18 NOTE — Progress Notes (Signed)
Chaplain responded to page from charge nurse. Patient's wife [name] requested support.  Nurse practitioner was outside door when chaplain responded. Both entered room together.  Patient's wife was bedside, dressed for church.  Patient was not blinking and not verbal. Wife aware that he is nearing death.  Patient said to her "I'm not there yet" earlier.  Couple is very close and are members of [facility].  Chaplain offered support, words from scripture and prayer.  Will be available later today if needed. Rev. Lynnell Chad Pager [phone]

## 2020-06-18 NOTE — Progress Notes (Signed)
Palliative:   Michael Wells is lying quietly in bed.  He is full comfort care.  He appears acutely/chronically ill and frail.  He will briefly make, but not keep eye contact, and does not respond to me verbally.  I do not believe he can make his basic needs known. His wife is at bedside. Michael Wells does not close his eye during our entire visit. Chaplain Lucretia Roers is at bedside providing support and comfort.  We talk about their faith and strength, and chaplain leads prayer.   We tlk about symptom management, Michael Wells increased RR and benefit of medications.  Morphine PO scheduled. We talk about residential hospice and my belief that Michael Wells is too unstable, nearing end of life, for transport. Michael Wells shares her agreement and that she would prefer her husband to stay here.    Conference with attending, bedside nursing staff, [facility], chaplain related to patient condition, needs, GOC, anticipated in hospital death.   Plan:  FULL COMFORT care, scheduled morphine.  Prognosis:  Hours to days, anticipate in hospital death as Michael Wells seems too unstable for transport.   40 minutes  Lillia Carmel, NP Palliative Medicine Tem  Team Phone [phone] Greater than 50% of this time was spent counseling and coordinating care related to the above assessment and plan.

## 2020-06-19 MED ORDER — MORPHINE SULFATE (CONCENTRATE) 10 MG/0.5ML PO SOLN
5.0000 mg | ORAL | Status: DC
  Administered 2020-06-19 – 2020-06-20 (×7): 5 mg via ORAL
  Filled 2020-06-19 (×7): qty 0.5

## 2020-06-19 MED ORDER — LORAZEPAM 2 MG/ML PO CONC
2.0000 mg | Freq: Three times a day (TID) | ORAL | Status: DC
  Administered 2020-06-19 – 2020-06-20 (×5): 2 mg via ORAL
  Filled 2020-06-19 (×5): qty 1

## 2020-06-19 NOTE — Progress Notes (Signed)
Palliative: Michael Wells is lying quietly in bed.  He appears to be acutely/chronically ill and quite frail, actively dying.  He will not make eye contact, and keeps his eyes open during my entire visit.  He is full comfort care at this time, and due to increased respiratory rate, symptom management adjusted.  His wife, Michael Wells, is at bedside.  Michael Wells states that Michael Wells has not eaten or taken anything to drink in 3 days.  She states that he will turn his head when water is offered.  We talked about prognosis with permission.  I believe that time is short, hours, not days.  Michael Wells brings up residential hospice.  She tells me that she does not want her husband moved, that she feels he is very comfortable here.  I am concerned that Michael Wells may be too unstable for transition to residential hospice, he is at risk of dying in route.  Conference with attending, bedside nursing staff, transition of care team related to patient condition, symptom management, prognosis, disposition. Comfort meds adjusted.  Plan: Full comfort care, symptom management increased. Prognosis: Hours to days, discussed with wife.  Anticipate in hospital death.  40 minutes  Lillia Carmel, NP Palliative medicine team Team phone [phone] Greater than 50% of this time was spent counseling and coordinating care related to the above assessment and plan.

## 2020-06-19 NOTE — Progress Notes (Signed)
PROGRESS NOTE  Michael Wells  HWE:[id number] DOB: 1940-12-29 DOA: 06-12-2020 PCP: Marva Panda, NP  Brief Narrative: Michael Wells is a 80 y.o. 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 diff testing. Urine culture also grew E. coli. The patient was admitted, started on IVF and po vancomycin with modest improvement in symptoms, though weakness remains significant for which SNF is recommended. Hospitalization complicated by agitated delirium, intermittent hypoxia, and electrolyte derangements. Palliative care was consulted due to failure to improve with suspected poor outcome and goals of care were transitioned to purely comfort. Labs were not drawn, most medications not given, IV removed. Over the course of the next few days the patient became more alert again, worked with PT and comfort measures were rescinded.  Goal was to pursue rehab at [facility] and to return home eventually.  However patient again rapidly declined, transitioned back to full comfort care.  Imminently dying, hospital death anticipated.  Unstable to transfer to residential hospice.  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 difficile colitis:  -Completed course of vancomycin.  Now on full comfort care and imminently dying.  Acute hypoxic respiratory failure due to atelectasis:   Resolved.  Acute metabolic encephalopathy:  Multifactorial due to possible underlying vascular dementia/diminished cerebral reserve complicated by acute illness, hospitalization, sleep deprivation etc.  Has not been agitated for several days now.  TSH 0.995, folic acid wnl.  Now on full comfort care.  Barely responsive.  Eyes open but not tracking or making eye contact.  History of embolic stroke, right carotid stenosis s/p stent Feb 2022:  Full comfort care.  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  Vitamin B12 deficiency:  Discontinued supplements.  E. coli UTI:   Completed antibiotic course.  Ascending aortic aneurysm:  Incidentally noted on CTA, 4.2cm.   Prediabetes:  HbA1c 5.8%.   Hypokalemia:  Recurrent.  May be related to ongoing diarrhea and poor oral intake.  Finally replaced.  Magnesium 1.8.  Remains at risk for recurrence but no further lab checks due to full comfort care.  Lower extremity swelling:  Strongly suspect aspect of venous insufficiency.   Hyperbilirubinemia:  RUQ U/S with cholelithiasis without biliary dilatation or evidence of inflammation.  Bilirubin remains at 3.3.  Unclear etiology.  Hyponatremia:  Resolved  Thrombocytopenia:  Possibly due to acute illness.    Adult failure to thrive/goals of care Patient rapidly declined.  Now transitioned to full comfort care and is imminently dying.  DVT prophylaxis: Lovenox Code Status: Full Family Communication: I discussed with patient spouse in detail at bedside 6/6. Disposition Plan:  Status is: Inpatient  Remains inpatient appropriate because: Hospital death anticipated.  Dispo: The patient is from: Home  Anticipated d/c is to: Hospital demise.              Patient currently is not medically stable for DC.  Consultants:   Palliative care  Procedures:   None  Antimicrobials:  Oral vancomycin  Ceftriaxone 5/11 - 5/13    Subjective: Patient seemed a little more awake and alert this morning during my visit, briefly looked at me but ongoing mouth breathing, groaning at times, nonverbal.  However subsequently during palliative care provider visit, eyes open, not making eye contact and seemed to have worsened.  Objective: Vitals:   06/18/20 0459 06/18/20 0848 06/18/20 1901 06/19/20 0655  BP: 132/85  126/76 131/72  Pulse: 100  92 89  Resp: 18  20 18   Temp: 99.2 F (37.3 C) 98.9 F (37.2 C) 98.2 F (36.8 C) 98.3 F (36.8 C)  TempSrc: Axillary Axillary Axillary Axillary  SpO2: 95%  98% 96%  Weight:      Height:        Intake/Output Summary (Last 24 hours) at 06/19/2020 1424 Last data filed at 06/19/2020 0500 Gross per 24 hour  Intake 0 ml  Output 250 ml  Net -250 ml   Filed Weights   06/08/2020 1353  Weight: 77.1 kg   Gen: 80 y.o. male, elderly, moderately built, frail and chronically ill looking, with exam as noted above.      LOS: 29 days   71, MD, [place], [facility]. [facility]  To contact the attending provider between 7A-7P or the covering provider during after hours 7P-7A, please log into the web site [email or web address] and access using universal [place] password for that web site. If you do not have the password, please call the hospital operator.

## 2020-06-19 NOTE — Social Work (Signed)
PTs insurance auth for SNF was approved. Per palliatives most recent note, it seems pt is not SNF criteria. It is anticipated that pt will have a hospital death.   Jimmy Picket, Theresia Majors, [place] Clinical Social Worker [phone]

## 2020-06-19 NOTE — Progress Notes (Deleted)
Nutrition Brief Note  Chart reviewed. Pt now transitioning to comfort care.  No further nutrition interventions planned at this time.  Please re-consult as needed.   Jalynn Waddell W, RD, LDN, CDCES Registered Dietitian II Certified Diabetes Care and Education Specialist Please refer to AMION for RD and/or RD on-call/weekend/after hours pager   

## 2020-06-20 MED ORDER — MORPHINE SULFATE (CONCENTRATE) 10 MG/0.5ML PO SOLN
10.0000 mg | ORAL | Status: DC
Start: 2020-06-20 — End: 2020-06-21
  Administered 2020-06-20: 10 mg via SUBLINGUAL
  Filled 2020-06-20: qty 0.5

## 2020-07-14 NOTE — Progress Notes (Signed)
     BRIEF OVERNIGHT PROGRESS REPORT  Notified by RN that patient has expired at 11. Patient was comfort care. 2 RN verified. Wife was available to RN.     Chinita Greenland MSNA ACNPC-AG Acute Care Nurse Practitioner [facility] Pager [phone] [place]

## 2020-07-14 NOTE — Progress Notes (Signed)
Daily Progress Note   Patient Name: Michael Wells       Date: 06-Jul-2020 DOB: 09-Jun-1940  Age: 80 y.o. MRN#: [id number] Attending Physician: Elease Etienne, MD Primary Care Physician: Marva Panda, NP Admit Date: 06/04/2020  Reason for Consultation/Follow-up: end of life care, symptom management  Subjective: Patient appears mildly uncomfortable. Unresponsive to voice and light touch. Respirations appear slightly labored. Moderate respiratory secretions noted. No family present at bedside.    Will increase scheduled morphine.   Length of Stay: 30  Current Medications: Scheduled Meds:   LORazepam  2 mg Oral TID   mouth rinse  15 mL Mouth Rinse BID   morphine CONCENTRATE  5 mg Oral Q4H     PRN Meds: acetaminophen **OR** acetaminophen, antiseptic oral rinse, glycopyrrolate **OR** glycopyrrolate **OR** glycopyrrolate, haloperidol **OR** haloperidol **OR** haloperidol lactate, loperamide, LORazepam **OR** LORazepam **OR** LORazepam, morphine CONCENTRATE, ondansetron **OR** ondansetron (ZOFRAN) IV, polyvinyl alcohol, Zinc Oxide       Vital Signs: BP 116/85 (BP Location: Left Arm)   Pulse (!) 133   Temp 98.4 F (36.9 C) (Axillary)   Resp (!) 22   Ht 5\' 7"  (1.702 m)   Wt 77.1 kg   SpO2 (!) 76%   BMI 26.63 kg/m  SpO2: SpO2: (!) 76 % O2 Device: O2 Device: Room Air  Intake/output summary:   Intake/Output Summary (Last 24 hours) at 2020/07/06 1320 Last data filed at 07/06/2020 08/20/2020 Gross per 24 hour  Intake 0 ml  Output 200 ml  Net -200 ml   LBM: Last BM Date: Jul 06, 2020 Baseline Weight: Weight: 77.1 kg Most recent weight: Weight: 77.1 kg       Palliative Assessment/Data: PPS 10%       Palliative Care Assessment & Plan    Patient Profile: 80 y.o. male  with past  medical history of CVA Feb '22 secondary to right carotid stenosis status post stenting, chronic low back pain, hyperlipidemia, ascending aortic aneurysm, admitted on 06/10/2020 with diarrhea and weakness. Work up showed acute c.diff colitis with concurrent sepsis.  Hospitalization complicated by agitated delirium, intermittent hypoxia, and electrolyte derangements. Palliative care has been consulted to assist with goals of care conversation given poor prognosis.   Comfort measures initiated 5/21, then rescinded 5/27 after patient had some improvement and was determined not to be imminently at  EOL. Patient then declined again and comfort care was re-started on 6/3.    Assessment: - acute C. diff colitis - sepsis - history of embolic stroke - acute metabolic encephalopathy - acute respiratory failure with hypoxia - aspiration risk - generalized weakness  Recommendations/Plan: Continue full comfort measures Increase scheduled morphine solution to 10 mg every 4 hours Family has declined residential hospice - anticipate hospital death PMT will continue to follow  Goals of Care and Additional Recommendations: Limitations on Scope of Treatment: Full Comfort Care  Code Status: DNR/DNI  Prognosis:  Hours - Days  Discharge Planning: Anticipated Hospital Death    Thank you for allowing the Palliative Medicine Team to assist in the care of this patient.   Total Time 15 minutes Prolonged Time Billed  no       Greater than 50%  of this time was spent counseling and coordinating care related to the above assessment and plan.  Merry Proud, NP  Please contact Palliative Medicine Team phone at [phone] for questions and concerns.

## 2020-07-14 NOTE — Progress Notes (Signed)
1927 Pt expired, death were pronounced by 2Rns. Wife was present in the room, emotional support given. All personal belongings will be given to the wife to take home. On call Md and Howard Pouch was notified with this death.

## 2020-07-14 NOTE — Progress Notes (Signed)
PROGRESS NOTE  Michael Wells  VVO:[id number] DOB: [birth year] DOA: 05/27/2020 PCP: Marva Panda, NP  Brief Narrative: Michael Wells is a 80 y.o. 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 diff testing. Urine culture also grew E. coli. The patient was admitted, started on IVF and po vancomycin with modest improvement in symptoms, though weakness remains significant for which SNF is recommended. Hospitalization complicated by agitated delirium, intermittent hypoxia, and electrolyte derangements. Palliative care was consulted due to failure to improve with suspected poor outcome and goals of care were transitioned to purely comfort. Labs were not drawn, most medications not given, IV removed. Over the course of the next few days the patient became more alert again, worked with PT and comfort measures were rescinded.  Goal was to pursue rehab at [facility] and to return home eventually.  However patient again rapidly declined, transitioned back to full comfort care.  Imminently dying, prognosis hours to day to day.  Unstable to transfer to residential hospice.  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 difficile colitis:  -Completed course of vancomycin.  Now on full comfort care and imminently dying.  Acute hypoxic respiratory failure due to  atelectasis:  Resolved.  Acute metabolic encephalopathy:  Multifactorial due to possible underlying vascular dementia/diminished cerebral reserve complicated by acute illness, hospitalization, sleep deprivation etc.  Has not been agitated for several days now.  TSH 0.995, folic acid wnl.  Now on full comfort care.  Unresponsive.  Eyes closed.  Mouth breathing.  History of embolic stroke, right carotid stenosis s/p stent Feb 2022:  Full comfort care.  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  Vitamin B12 deficiency:  Discontinued supplements.  E. coli UTI:   Completed antibiotic course.  Ascending aortic aneurysm:  Incidentally noted on CTA, 4.2cm.   Prediabetes:  HbA1c 5.8%.   Hypokalemia:  Was replaced.  Magnesium 1.8.  Remains at risk for recurrence but no further lab checks due to full comfort care.  Lower extremity swelling:  Strongly suspect aspect of venous insufficiency.   Hyperbilirubinemia:  RUQ U/S with cholelithiasis without biliary dilatation or evidence of inflammation.  Bilirubin remains at 3.3.  Unclear etiology.  Hyponatremia:  Resolved  Thrombocytopenia:  Possibly due to acute illness.    Adult failure to thrive/goals of care Patient rapidly declined.  Now transitioned to full comfort care and is imminently dying.  DVT prophylaxis: Lovenox Code Status: Full Family Communication: I discussed with patient spouse in detail at bedside 6/7.  Comforted her.  Reported that he has been unresponsive since arrival this morning.  They have been married for 40+ years. Disposition Plan:  Status is: Inpatient  Remains inpatient appropriate because: Hospital death anticipated.  Dispo: The patient is from:  Home              Anticipated d/c is to: Hospital demise.              Patient currently is not medically stable for DC.  Consultants:   Palliative care  Procedures:   None  Antimicrobials:  Oral  vancomycin  Ceftriaxone 5/11 - 5/13   Subjective: Unresponsive.  No pain or discomfort as per spouse.  Objective: Vitals:   06/18/20 0848 06/18/20 1901 06/19/20 0655 07/05/2020 0500  BP:  126/76 131/72 116/85  Pulse:  92 89 (!) 133  Resp:  20 18 (!) 22  Temp: 98.9 F (37.2 C) 98.2 F (36.8 C) 98.3 F (36.8 C) 98.4 F (36.9 C)  TempSrc: Axillary Axillary Axillary Axillary  SpO2:  98% 96% (!) 76%  Weight:      Height:        Intake/Output Summary (Last 24 hours) at 06/18/2020 1414 Last data filed at 07/12/2020 1300 Gross per 24 hour  Intake 0 ml  Output 400 ml  Net -400 ml   Filed Weights   2020-06-09 1353  Weight: 77.1 kg   Gen: 80 y.o. male, elderly, moderately built, frail and chronically ill looking, continues to decline.  Totally unresponsive.  Eyes closed.  Mouth breathing.  No apneic spells noted.  No excess secretions audible.      LOS: 30 days   Marcellus Scott, MD, [place], [facility]. [facility]  To contact the attending provider between 7A-7P or the covering provider during after hours 7P-7A, please log into the web site [email or web address] and access using universal [place] password for that web site. If you do not have the password, please call the hospital operator.

## 2020-07-14 NOTE — Death Summary Note (Signed)
DEATH SUMMARY   Patient Details  Name: Michael Wells Orrison MRN: [id number] DOB: 1940-12-09  Admission/Discharge Information   Admit Date:  05/30/2020  Date of Death: Date of Death: 06/16/2020  Time of Death: Time of Death: 1927  Length of Stay: 30  Referring Physician: Marva PandaMillsaps, Kimberly, NP   Reason(s) for Hospitalization    Diagnoses  Preliminary cause of death: Severe Sepsis due to C.Difficile Colitis. 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 diff testing. Urine culture also grew E. coli. The patient was admitted, started on IVF and po vancomycin with modest improvement in symptoms, though weakness remains significant for which SNF is recommended. Hospitalization complicated by agitated delirium, intermittent hypoxia, and electrolyte derangements. Palliative care was consulted due to failure to improve with suspected poor outcome and goals of care were transitioned to purely comfort. Labs were not drawn, most medications not given, IV removed. Over the course of the next few days the patient became more alert again,  worked with PT and comfort measures were rescinded.  Goal was [facility] and to return home eventually.  However patient again rapidly declined, transitioned back to full comfort care and eventually demised.  Rest of hospital course details as outlined below:   Assessment & Plan:   Severe sepsis due to C. difficile colitis: -Completed course of vancomycin.     Acute hypoxic respiratory failure due to atelectasis:  Had resolved.   Acute metabolic encephalopathy: Multifactorial due to possible underlying vascular dementia/diminished cerebral reserve complicated by acute illness, hospitalization, sleep deprivation etc. TSH 0.995, folic acid wnl.     History of embolic stroke, right carotid stenosis s/p stent Feb 2022:    MASD of buttocks due to frequent diarrhea:    Vitamin B12 deficiency:     E. coli UTI:   Completed antibiotic course.   Ascending aortic aneurysm:  Incidentally noted on CTA, 4.2cm.   Prediabetes:  HbA1c 5.8%.   Hypokalemia:  Was replaced.  Magnesium 1.8.     Lower extremity swelling:  Strongly suspect aspect of venous insufficiency.   Hyperbilirubinemia:  RUQ U/S with cholelithiasis without biliary dilatation or evidence of inflammation.  Unclear etiology.   Hyponatremia: Resolved   Thrombocytopenia: Possibly due to acute illness.     Adult failure to thrive/goals of care Patient rapidly declined and transitioned to full comfort care.     Consultants:  Palliative care   Procedures:  None    Pertinent Labs and Studies  Significant Diagnostic Studies CT HEAD WO CONTRAST  Result Date: 05/31/2020 CLINICAL DATA:  Altered  mental status EXAM: CT HEAD WITHOUT CONTRAST TECHNIQUE: Contiguous axial images were obtained from the base of the skull through the vertex without intravenous contrast. COMPARISON:  03/09/2020 FINDINGS: Brain: Chronic atrophic changes and white matter ischemic changes are again seen. No acute infarct, acute  hemorrhage or space-occupying mass lesion is noted. Vascular: No hyperdense vessel or unexpected calcification. Skull: Normal. Negative for fracture or focal lesion. Sinuses/Orbits: No acute finding. Other: None. IMPRESSION: Chronic atrophic and ischemic changes without acute abnormality. The overall appearance is similar to that seen on prior exam. Electronically Signed   By: Alcide Clever M.D.   On: 05/31/2020 16:28   NM Pulmonary Perfusion  Result Date: 05/29/2020 CLINICAL DATA:  Positive D-dimer and chest pain, initial encounter EXAM: NUCLEAR MEDICINE PERFUSION LUNG SCAN TECHNIQUE: Perfusion images were obtained in multiple projections after intravenous injection of radiopharmaceutical. Ventilation scans intentionally deferred if perfusion scan and chest x-ray adequate for interpretation during COVID 19 epidemic. RADIOPHARMACEUTICALS:  4.4 mCi Tc-62m MAA IV COMPARISON:  Chest x-ray from earlier in the same day. FINDINGS: Adequate uptake of radiotracer is noted throughout both lungs. No which shaped defect is identified to suggest pulmonary embolism. Large defect from the cardiac shadow and tortuous thoracic aorta is noted. IMPRESSION: No findings to suggest pulmonary embolism. Electronically Signed   By: Alcide Clever M.D.   On: 05/29/2020 20:38   DG CHEST PORT 1 VIEW  Result Date: 06/01/2020 CLINICAL DATA:  Respiratory distress. EXAM: PORTABLE CHEST 1 VIEW COMPARISON:  05/29/2020. FINDINGS: Cardiomegaly. Bilateral interstitial prominence and small left pleural effusion. Findings suggest CHF. Pneumonitis can not be excluded. Left base subsegmental atelectasis again noted. No pneumothorax. IMPRESSION: Cardiomegaly with bilateral interstitial prominence and small left pleural effusions suggesting CHF. Pneumonitis cannot be excluded. Left base subsegmental atelectasis again noted. Electronically Signed   By: Maisie Fus  Register   On: 06/01/2020 10:13   DG CHEST PORT 1 VIEW  Result Date: 05/29/2020 CLINICAL  DATA:  Shortness of breath EXAM: PORTABLE CHEST 1 VIEW COMPARISON:  Film from the previous day. FINDINGS: Cardiac shadow remains enlarged. Tortuous thoracic aorta is again seen. Mild atelectatic changes are again seen in the left base. No effusion is noted. No pneumothorax is seen. No bony abnormality is noted. IMPRESSION: Stable left basilar atelectasis. Electronically Signed   By: Alcide Clever M.D.   On: 05/29/2020 17:55   DG CHEST PORT 1 VIEW  Result Date: 05/28/2020 CLINICAL DATA:  Respiratory distress.  Evaluate pleural effusions. EXAM: PORTABLE CHEST 1 VIEW COMPARISON:  12-Jun-2020 FINDINGS: Heart size and mediastinal contours are stable. Lung volumes are low. No pleural effusion or interstitial edema identified. Platelike atelectasis noted in the left lower lung. IMPRESSION: 1. Low lung volumes. 2. Platelike atelectasis in the left lower lung. Electronically Signed   By: Signa Kell M.D.   On: 05/28/2020 09:50   EEG adult  Result Date: 06/01/2020 Charlsie Quest, MD     06/01/2020  8:35 AM Patient Name: DEEN DEGUIA MRN: [id number] Epilepsy Attending: Charlsie Quest Referring Physician/Provider: Dr Marlin Canary Date: 05/31/2020 Duration: 26.34 mins Patient history: 80 year old male with altered mental status.  EEG to evaluate for seizures. Level of alertness: lethargic AEDs during EEG study: None Technical aspects: This EEG study was done with scalp electrodes positioned according to the 10-20 International system of electrode placement. Electrical activity was acquired at a sampling rate of 500Hz  and reviewed with a high frequency filter of 70Hz  and a low frequency filter of 1Hz . EEG data were recorded continuously  and digitally stored. Description: No posterior dominant rhythm was seen. EEG showed continuous generalized 5 to 6 Hz theta as well as intermittent generalized 2 to 3 Hz delta slowing. Hyperventilation and photic stimulation were not performed.   ABNORMALITY - Continuous slow,  generalized IMPRESSION: This study is suggestive of moderate diffuse encephalopathy, nonspecific etiology. No seizures or epileptiform discharges were seen throughout the recording. Priyanka O Yadav   VAS [place] LOWER EXTREMITY VENOUS (DVT)  Result Date: 05/31/2020  Lower Venous DVT Study Patient Name:  ALDRIDGE KRZYZANOWSKI  Date of Exam:   05/31/2020 Medical Rec #: [id number]       Accession #:    [id number] Date of Birth: 03/27/1940       Patient Gender: M Patient Age:   79Y Exam Location:  [facility] Procedure:      VAS [place] LOWER EXTREMITY VENOUS (DVT) Referring Phys: [id number] Nine --------------------------------------------------------------------------------  Indications: Edema.  Limitations: Body habitus, poor ultrasound/tissue interface, bandages and sunlight. Comparison Study: No prior study Performing Technologist: Gertie Fey MHA, RDMS, RVT, RDCS  Examination Guidelines: A complete evaluation includes B-mode imaging, spectral Doppler, color Doppler, and power Doppler as needed of all accessible portions of each vessel. Bilateral testing is considered an integral part of a complete examination. Limited examinations for reoccurring indications may be performed as noted. The reflux portion of the exam is performed with the patient in reverse Trendelenburg.  +---------+---------------+---------+-----------+----------+--------------+ RIGHT    CompressibilityPhasicitySpontaneityPropertiesThrombus Aging +---------+---------------+---------+-----------+----------+--------------+ FV Prox  Full                                                        +---------+---------------+---------+-----------+----------+--------------+ FV Mid   Full                                                        +---------+---------------+---------+-----------+----------+--------------+ FV DistalFull                                                         +---------+---------------+---------+-----------+----------+--------------+ POP      Full           Yes      Yes                                 +---------+---------------+---------+-----------+----------+--------------+ PTV      Full                                                        +---------+---------------+---------+-----------+----------+--------------+ PERO     Full                                                        +---------+---------------+---------+-----------+----------+--------------+  Right Technical Findings: Not visualized segments include CFV, SFJ, PFV.  +---------+---------------+---------+-----------+----------+--------------+ LEFT     CompressibilityPhasicitySpontaneityPropertiesThrombus Aging +---------+---------------+---------+-----------+----------+--------------+ CFV      Full           Yes      Yes                                 +---------+---------------+---------+-----------+----------+--------------+ SFJ      Full                                                        +---------+---------------+---------+-----------+----------+--------------+ FV Prox  Full                                                        +---------+---------------+---------+-----------+----------+--------------+ FV Mid   Full                                                        +---------+---------------+---------+-----------+----------+--------------+ FV DistalFull                                                        +---------+---------------+---------+-----------+----------+--------------+ PFV      Full                                                        +---------+---------------+---------+-----------+----------+--------------+ POP      Full           Yes      Yes                                 +---------+---------------+---------+-----------+----------+--------------+ PTV      Full                                                         +---------+---------------+---------+-----------+----------+--------------+ PERO     Full                                                        +---------+---------------+---------+-----------+----------+--------------+     Summary: RIGHT: - There is no evidence of deep vein thrombosis in the lower extremity. However, portions of this examination were limited- see technologist comments above.  - No cystic structure  found in the popliteal fossa.  LEFT: - There is no evidence of deep vein thrombosis in the lower extremity.  - No cystic structure found in the popliteal fossa.  *See table(s) above for measurements and observations. Electronically signed by Gretta Began MD on 05/31/2020 at 8:34:34 PM.    Final    US Abdomen Limited RUQ (LIVER/GB)  Result Date: 05/23/2020 CLINICAL DATA:  Elevated LFTs. EXAM: ULTRASOUND ABDOMEN LIMITED RIGHT UPPER QUADRANT COMPARISON:  CT scan 06-17-2020 FINDINGS: Gallbladder: Layering tiny gallstones evident. Gallbladder wall thickness upper normal at 2-3 mm. No pericholecystic fluid. No sonographic Murphy sign. Common bile duct: Diameter: 3-4 mm Liver: No focal lesion identified. Within normal limits in parenchymal echogenicity. Portal vein is patent on color Doppler imaging with normal direction of blood flow towards the liver. Other: Small volume ascites. Large exophytic cyst noted upper pole right kidney. IMPRESSION: Cholelithiasis without gallbladder wall thickening or pericholecystic fluid. No biliary dilatation. Electronically Signed   By: Kennith Center M.D.   On: 05/23/2020 20:54    Microbiology No results found for this or any previous visit (from the past 240 hour(s)).  Lab Basic Metabolic Panel: Recent Labs  Lab 06/16/20 0107  NA 143  K 3.7  CL 106  CO2 32  GLUCOSE 111*  BUN 7*  CREATININE 0.64  CALCIUM 8.9   Liver Function Tests: No results for input(s): AST, ALT, ALKPHOS, BILITOT, PROT, ALBUMIN in the last 168 hours. No  results for input(s): LIPASE, AMYLASE in the last 168 hours. No results for input(s): AMMONIA in the last 168 hours. CBC: Recent Labs  Lab 06/16/20 0107  WBC 7.0  HGB 11.2*  HCT 33.6*  MCV 99.7  PLT 130*   Cardiac Enzymes: No results for input(s): CKTOTAL, CKMB, CKMBINDEX, TROPONINI in the last 168 hours. Sepsis Labs: Recent Labs  Lab 06/16/20 0107  WBC 7.0    Procedures/Operations  None   [facility] 06/22/2020, 8:37 AM

## 2020-07-14 NOTE — Progress Notes (Signed)
Patient in resting state. Sublingual liquid morphine and ativan given for comfort. Wife at bedside.

## 2020-07-14 DEATH — deceased

## 2022-06-26 IMAGING — US IR VERTEBRAL  NON-SELECT UNILAT LEFT (MS)
1 series · 2 of 2 positions shown · non-contrast
Comparison: CT/CT angiogram of the head and neck March 09, 2020.

INDICATION: 79-year-old male with past medical history significant for chronic
pain. He presented with right-sided vision loss. CT angiogram of the
head and neck showed severe stenosis of the cervical right ICA an
MRI of the brain showed right hemisphere embolic infarcts. He was
transferred to our service for a diagnostic cerebral angiogram with
carotid stenting. In anticipation to this procedure patient was
loaded on aspirin and Brilinta.

EXAM:
Ultrasound-guided vascular [REDACTED] cerebral angiogram
Carotid stenting with cerebral protection device
TECHNIQUE: Informed written consent was obtained from the patient and his wife
after a thorough discussion of the procedural risks, benefits and
alternatives. All questions were addressed. Maximal Sterile Barrier
Technique was utilized including caps, mask, sterile gowns, sterile
gloves, sterile drape, hand hygiene and skin antiseptic. A timeout
was performed prior to the initiation of the procedure.

[Series 1: ir (id) (id) · 2 of 2 slices shown]
[im 1/2]
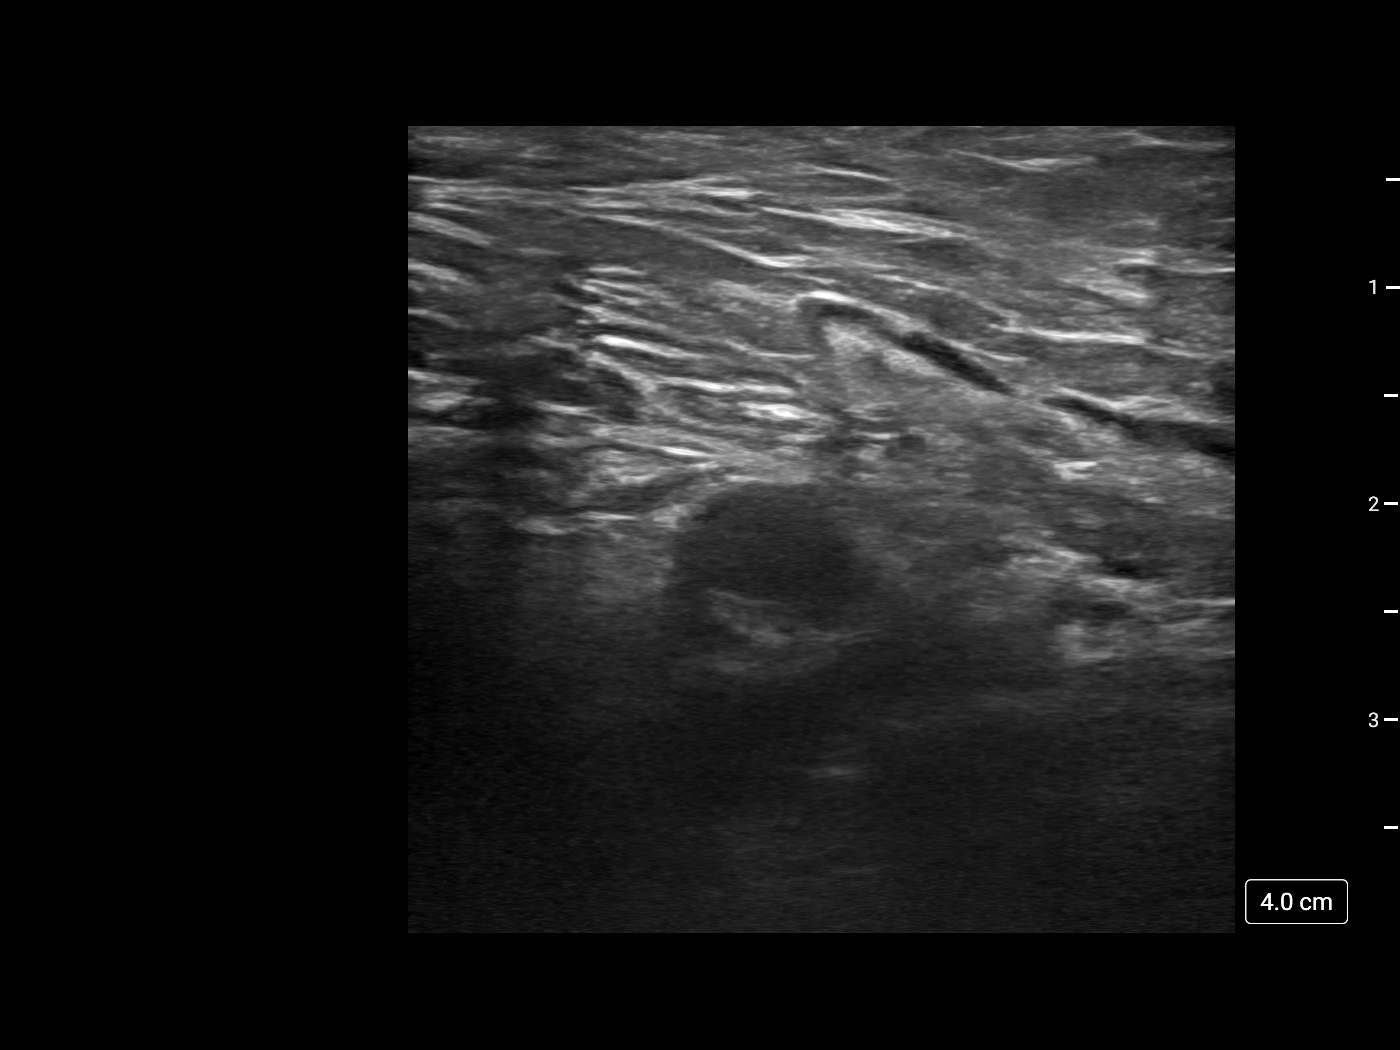
[im 2/2]
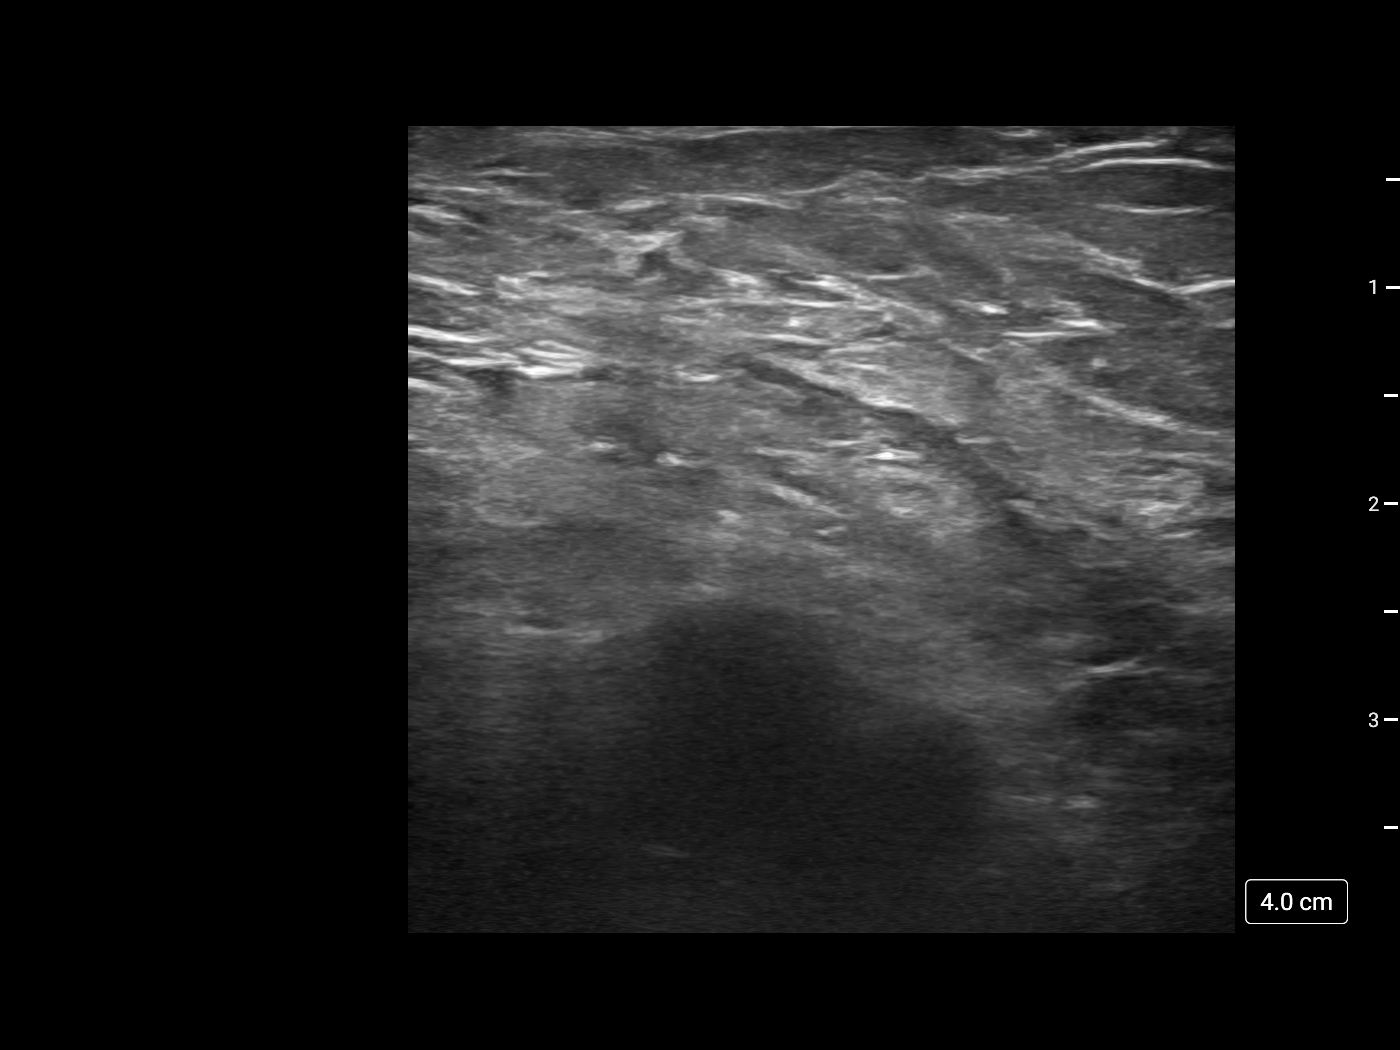

[2 of 2 positions shown; findings below may reference images not displayed]

MEDICATIONS:
A total of 6,000 units of heparin IV.

ANESTHESIA/SEDATION:
The procedure was performed under monitored anesthesia care.

CONTRAST:  60 mL of Omnipaque 300 mg/mL

FLUOROSCOPY TIME:  Fluoroscopy Time: 27 minutes 48 seconds (541.3
mGy).

COMPLICATIONS:
None immediate.
The right groin was prepped and draped in the usual sterile fashion.
Using a micropuncture kit and the modified Seldinger technique,
access was gained to the right common femoral artery and an 8 French
sheath was placed. Real-time ultrasound guidance was utilized for
vascular access including the acquisition of a permanent ultrasound
image documenting patency of the accessed vessel.

Under fluoroscopy, a 5 Marie-Lena Janich 2 catheter and a 0.035"
Terumo Glidewire were navigated into the aortic arch. The catheter
was placed into the common origin of innominate and left common
carotid artery. Frontal angiograms of the neck were obtained. The
catheter was then placed into the left subclavian artery. Frontal
angiogram of the neck were obtained. The catheter was then advanced
into the right common carotid artery. Frontal and lateral angiograms
of the neck were obtained followed by frontal and lateral angiograms
of the head.
FINDINGS: 1. Soft plaque in the cervical right ICA just distal to the bulb
resulting in approximately 75-80% stenosis. Irregularity with
ulceration at the carotid bulb.
2. Intracranial atherosclerotic disease with approximately 50%
stenosis at the supraclinoid right ICA. Slow opacification of
intracranial branches noted.
3. Patent right common femoral artery.

PROCEDURE:
The [place] 2 catheter was exchanged over the wire and under
biplane roadmap for a 6 French shuttle sheath. Frontal and lateral
angiograms of the neck were obtained.

Using biplane roadmap a 2.5-4.8 Emboshield NAV 6 cerebral protection
device was advanced into the distal cervical segment of the right
ICA.

Subsequently, a 6-8 x 40 mm XACT carotid stent was deployed in the
cervical right ICA from the origin of the bulb to the mid cervical
segment, covering the bulb ulceration and focal stenosis.

Follow-up angiogram showed adequate stent deployment with resolution
of stenosis and no evidence of complication.

The cerebral protection device was subsequently recaptured.

Delayed angiograms with frontal and lateral views of the neck showed
no evidence of in stent platelet aggregation.

Right common carotid artery angiograms with frontal and lateral
views of the head showed significant improvement of the anterograde
flow to the right anterior circulation.

Right common femoral artery angiograms were obtained with right
anterior oblique and lateral views. The puncture is at the level of
the common femoral artery which has adequate caliber.

The femoral sheath was exchanged for a Perclose ProGlide which was
utilized for access closure. Immediate hemostasis was achieved.
IMPRESSION: Successful and uncomplicated cervical right internal carotid artery
stenting with cerebral protection device for treatment of
symptomatic stenosis.

PLAN:
Continue on dual anti-platelet therapy with Brilinta and aspirin.

Follow-up carotid duplex at three-month postprocedure.
